# Patient Record
Sex: Female | Born: 1975 | Race: White | Hispanic: No | Marital: Married | Smoking: Never smoker
Health system: Southern US, Community
[De-identification: ages and names within clinical notes are randomized; demographics above are authoritative.]

## PROBLEM LIST (undated history)

## (undated) DIAGNOSIS — D649 Anemia, unspecified: Secondary | ICD-10-CM

## (undated) DIAGNOSIS — F329 Major depressive disorder, single episode, unspecified: Secondary | ICD-10-CM

## (undated) DIAGNOSIS — R519 Headache, unspecified: Secondary | ICD-10-CM

## (undated) DIAGNOSIS — J209 Acute bronchitis, unspecified: Secondary | ICD-10-CM

## (undated) DIAGNOSIS — Z0000 Encounter for general adult medical examination without abnormal findings: Secondary | ICD-10-CM

## (undated) DIAGNOSIS — G8929 Other chronic pain: Secondary | ICD-10-CM

## (undated) DIAGNOSIS — R1032 Left lower quadrant pain: Secondary | ICD-10-CM

## (undated) DIAGNOSIS — H409 Unspecified glaucoma: Secondary | ICD-10-CM

## (undated) DIAGNOSIS — F32A Depression, unspecified: Secondary | ICD-10-CM

## (undated) DIAGNOSIS — Z8742 Personal history of other diseases of the female genital tract: Secondary | ICD-10-CM

## (undated) DIAGNOSIS — R51 Headache: Secondary | ICD-10-CM

## (undated) HISTORY — PX: ANKLE SURGERY: SHX546

## (undated) HISTORY — DX: Encounter for general adult medical examination without abnormal findings: Z00.00

## (undated) HISTORY — DX: Headache: R51

## (undated) HISTORY — DX: Anemia, unspecified: D64.9

## (undated) HISTORY — DX: Acute bronchitis, unspecified: J20.9

## (undated) HISTORY — DX: Headache, unspecified: R51.9

## (undated) HISTORY — DX: Personal history of other diseases of the female genital tract: Z87.42

## (undated) HISTORY — DX: Left lower quadrant pain: R10.32

## (undated) HISTORY — DX: Unspecified glaucoma: H40.9

## (undated) HISTORY — DX: Major depressive disorder, single episode, unspecified: F32.9

## (undated) HISTORY — DX: Depression, unspecified: F32.A

## (undated) HISTORY — DX: Other chronic pain: G89.29

---

## 2002-02-15 ENCOUNTER — Emergency Department (HOSPITAL_COMMUNITY): Admission: EM | Admit: 2002-02-15 | Discharge: 2002-02-15 | Payer: Self-pay

## 2004-06-25 ENCOUNTER — Ambulatory Visit: Payer: Self-pay | Admitting: Internal Medicine

## 2004-07-24 ENCOUNTER — Ambulatory Visit: Payer: Self-pay | Admitting: Internal Medicine

## 2004-11-06 ENCOUNTER — Ambulatory Visit: Payer: Self-pay | Admitting: Internal Medicine

## 2005-09-17 ENCOUNTER — Emergency Department (HOSPITAL_COMMUNITY): Admission: EM | Admit: 2005-09-17 | Discharge: 2005-09-17 | Payer: Self-pay | Admitting: Emergency Medicine

## 2005-09-18 ENCOUNTER — Ambulatory Visit: Payer: Self-pay | Admitting: Internal Medicine

## 2005-09-23 ENCOUNTER — Ambulatory Visit: Payer: Self-pay | Admitting: Internal Medicine

## 2005-09-25 ENCOUNTER — Ambulatory Visit: Payer: Self-pay

## 2005-09-25 ENCOUNTER — Encounter: Payer: Self-pay | Admitting: Cardiology

## 2006-01-15 ENCOUNTER — Ambulatory Visit: Payer: Self-pay | Admitting: Internal Medicine

## 2006-05-08 ENCOUNTER — Ambulatory Visit: Payer: Self-pay | Admitting: Internal Medicine

## 2006-07-16 ENCOUNTER — Ambulatory Visit: Payer: Self-pay | Admitting: Internal Medicine

## 2006-09-29 DIAGNOSIS — J309 Allergic rhinitis, unspecified: Secondary | ICD-10-CM

## 2006-09-29 DIAGNOSIS — J45909 Unspecified asthma, uncomplicated: Secondary | ICD-10-CM | POA: Insufficient documentation

## 2006-09-29 DIAGNOSIS — R51 Headache: Secondary | ICD-10-CM

## 2006-09-29 DIAGNOSIS — R519 Headache, unspecified: Secondary | ICD-10-CM | POA: Insufficient documentation

## 2007-03-01 ENCOUNTER — Ambulatory Visit: Payer: Self-pay | Admitting: Internal Medicine

## 2007-03-01 DIAGNOSIS — R5381 Other malaise: Secondary | ICD-10-CM

## 2007-03-01 DIAGNOSIS — R5383 Other fatigue: Secondary | ICD-10-CM

## 2007-03-05 LAB — CONVERTED CEMR LAB
Basophils Absolute: 0 10*3/uL (ref 0.0–0.1)
Basophils Relative: 0.4 % (ref 0.0–1.0)
Eosinophils Absolute: 0.2 10*3/uL (ref 0.0–0.6)
Eosinophils Relative: 3.1 % (ref 0.0–5.0)
HCT: 40.4 % (ref 36.0–46.0)
Hemoglobin: 14.2 g/dL (ref 12.0–15.0)
Lymphocytes Relative: 28.1 % (ref 12.0–46.0)
MCHC: 35.2 g/dL (ref 30.0–36.0)
MCV: 93.3 fL (ref 78.0–100.0)
Monocytes Absolute: 0.4 10*3/uL (ref 0.2–0.7)
Monocytes Relative: 7 % (ref 3.0–11.0)
Neutro Abs: 4 10*3/uL (ref 1.4–7.7)
Neutrophils Relative %: 61.4 % (ref 43.0–77.0)
Platelets: 267 10*3/uL (ref 150–400)
RBC: 4.33 M/uL (ref 3.87–5.11)
RDW: 11.3 % — ABNORMAL LOW (ref 11.5–14.6)
WBC: 6.4 10*3/uL (ref 4.5–10.5)

## 2007-03-10 ENCOUNTER — Encounter: Payer: Self-pay | Admitting: Internal Medicine

## 2007-05-10 ENCOUNTER — Ambulatory Visit: Payer: Self-pay | Admitting: Internal Medicine

## 2007-08-30 ENCOUNTER — Ambulatory Visit: Payer: Self-pay | Admitting: Internal Medicine

## 2007-08-30 DIAGNOSIS — H6690 Otitis media, unspecified, unspecified ear: Secondary | ICD-10-CM | POA: Insufficient documentation

## 2008-04-25 ENCOUNTER — Ambulatory Visit: Payer: Self-pay | Admitting: Internal Medicine

## 2008-04-25 DIAGNOSIS — N6089 Other benign mammary dysplasias of unspecified breast: Secondary | ICD-10-CM

## 2008-08-31 ENCOUNTER — Ambulatory Visit: Payer: Self-pay | Admitting: Family Medicine

## 2008-08-31 LAB — CONVERTED CEMR LAB
Bilirubin Urine: NEGATIVE
Blood in Urine, dipstick: NEGATIVE
Glucose, Urine, Semiquant: NEGATIVE
Ketones, urine, test strip: NEGATIVE
Nitrite: NEGATIVE
Protein, U semiquant: NEGATIVE
Specific Gravity, Urine: 1.015
Urobilinogen, UA: 0.2
WBC Urine, dipstick: NEGATIVE
pH: 7

## 2008-10-06 ENCOUNTER — Ambulatory Visit: Payer: Self-pay | Admitting: Internal Medicine

## 2008-10-06 DIAGNOSIS — R002 Palpitations: Secondary | ICD-10-CM | POA: Insufficient documentation

## 2008-10-06 DIAGNOSIS — R42 Dizziness and giddiness: Secondary | ICD-10-CM | POA: Insufficient documentation

## 2009-01-30 ENCOUNTER — Telehealth: Payer: Self-pay | Admitting: Internal Medicine

## 2009-01-30 ENCOUNTER — Ambulatory Visit: Payer: Self-pay | Admitting: Internal Medicine

## 2009-01-30 DIAGNOSIS — R1013 Epigastric pain: Secondary | ICD-10-CM

## 2009-01-30 LAB — CONVERTED CEMR LAB
ALT: 11 units/L (ref 0–35)
AST: 14 units/L (ref 0–37)
Albumin: 4.9 g/dL (ref 3.5–5.2)
Alkaline Phosphatase: 50 units/L (ref 39–117)
BUN: 16 mg/dL (ref 6–23)
Basophils Absolute: 0 10*3/uL (ref 0.0–0.1)
Basophils Relative: 0 % (ref 0–1)
Bilirubin, Direct: 0.3 mg/dL (ref 0.0–0.3)
CO2: 26 meq/L (ref 19–32)
Calcium: 9.8 mg/dL (ref 8.4–10.5)
Chloride: 101 meq/L (ref 96–112)
Creatinine, Ser: 0.68 mg/dL (ref 0.40–1.20)
Eosinophils Absolute: 0 10*3/uL (ref 0.0–0.7)
Eosinophils Relative: 0 % (ref 0–5)
Glucose, Bld: 116 mg/dL — ABNORMAL HIGH (ref 70–99)
HCT: 41.5 % (ref 36.0–46.0)
Hemoglobin: 14.3 g/dL (ref 12.0–15.0)
Indirect Bilirubin: 0.9 mg/dL (ref 0.0–0.9)
Lipase: 20 units/L (ref 0–75)
Lymphocytes Relative: 7 % — ABNORMAL LOW (ref 12–46)
Lymphs Abs: 0.6 10*3/uL — ABNORMAL LOW (ref 0.7–4.0)
MCHC: 34.5 g/dL (ref 30.0–36.0)
MCV: 91 fL (ref 78.0–100.0)
Monocytes Absolute: 0.2 10*3/uL (ref 0.1–1.0)
Monocytes Relative: 2 % — ABNORMAL LOW (ref 3–12)
Neutro Abs: 8.1 10*3/uL — ABNORMAL HIGH (ref 1.7–7.7)
Neutrophils Relative %: 91 % — ABNORMAL HIGH (ref 43–77)
Platelets: 227 10*3/uL (ref 150–400)
Potassium: 4.5 meq/L (ref 3.5–5.3)
RBC: 4.56 M/uL (ref 3.87–5.11)
RDW: 11.8 % (ref 11.5–15.5)
Sodium: 137 meq/L (ref 135–145)
Total Bilirubin: 1.2 mg/dL (ref 0.3–1.2)
Total Protein: 7.7 g/dL (ref 6.0–8.3)
WBC: 8.9 10*3/uL (ref 4.0–10.5)

## 2009-08-27 ENCOUNTER — Ambulatory Visit: Payer: Self-pay | Admitting: Internal Medicine

## 2009-08-27 LAB — CONVERTED CEMR LAB: IgE (Immunoglobulin E), Serum: 207.4 intl units/mL — ABNORMAL HIGH (ref 0.0–180.0)

## 2009-08-28 ENCOUNTER — Encounter: Payer: Self-pay | Admitting: Internal Medicine

## 2009-11-09 ENCOUNTER — Ambulatory Visit: Payer: Self-pay | Admitting: Diagnostic Radiology

## 2009-11-09 ENCOUNTER — Ambulatory Visit: Payer: Self-pay | Admitting: Internal Medicine

## 2009-11-09 ENCOUNTER — Ambulatory Visit (HOSPITAL_BASED_OUTPATIENT_CLINIC_OR_DEPARTMENT_OTHER): Admission: RE | Admit: 2009-11-09 | Discharge: 2009-11-09 | Payer: Self-pay | Admitting: Internal Medicine

## 2009-11-09 DIAGNOSIS — M542 Cervicalgia: Secondary | ICD-10-CM

## 2009-11-09 DIAGNOSIS — R011 Cardiac murmur, unspecified: Secondary | ICD-10-CM

## 2009-11-12 ENCOUNTER — Telehealth: Payer: Self-pay | Admitting: Internal Medicine

## 2009-12-18 ENCOUNTER — Ambulatory Visit: Payer: Self-pay | Admitting: Internal Medicine

## 2009-12-18 DIAGNOSIS — J029 Acute pharyngitis, unspecified: Secondary | ICD-10-CM

## 2009-12-22 ENCOUNTER — Emergency Department (HOSPITAL_BASED_OUTPATIENT_CLINIC_OR_DEPARTMENT_OTHER): Admission: EM | Admit: 2009-12-22 | Discharge: 2009-12-22 | Payer: Self-pay | Admitting: Emergency Medicine

## 2009-12-24 ENCOUNTER — Telehealth: Payer: Self-pay | Admitting: Internal Medicine

## 2010-03-31 LAB — CONVERTED CEMR LAB
ALT: 10 units/L (ref 0–35)
AST: 15 units/L (ref 0–37)
Albumin: 4.5 g/dL (ref 3.5–5.2)
Alkaline Phosphatase: 53 units/L (ref 39–117)
BUN: 8 mg/dL (ref 6–23)
Basophils Absolute: 0 10*3/uL (ref 0.0–0.1)
Basophils Relative: 1 % (ref 0–1)
Bilirubin Urine: NEGATIVE
Bilirubin, Direct: 0.3 mg/dL (ref 0.0–0.3)
CO2: 28 meq/L (ref 19–32)
Calcium: 9.4 mg/dL (ref 8.4–10.5)
Chloride: 103 meq/L (ref 96–112)
Creatinine, Ser: 0.58 mg/dL (ref 0.40–1.20)
Eosinophils Absolute: 0.1 10*3/uL (ref 0.0–0.7)
Eosinophils Relative: 1 % (ref 0–5)
Free T4: 1.07 ng/dL (ref 0.80–1.80)
Glucose, Bld: 87 mg/dL (ref 70–99)
HCT: 40.3 % (ref 36.0–46.0)
Hemoglobin, Urine: NEGATIVE
Hemoglobin: 14.2 g/dL (ref 12.0–15.0)
Indirect Bilirubin: 1.2 mg/dL — ABNORMAL HIGH (ref 0.0–0.9)
Ketones, ur: NEGATIVE mg/dL
Leukocytes, UA: NEGATIVE
Lymphocytes Relative: 25 % (ref 12–46)
Lymphs Abs: 1 10*3/uL (ref 0.7–4.0)
MCHC: 35.2 g/dL (ref 30.0–36.0)
MCV: 90.2 fL (ref 78.0–100.0)
Monocytes Absolute: 0.4 10*3/uL (ref 0.1–1.0)
Monocytes Relative: 9 % (ref 3–12)
Neutro Abs: 2.7 10*3/uL (ref 1.7–7.7)
Neutrophils Relative %: 64 % (ref 43–77)
Nitrite: NEGATIVE
Platelets: 225 10*3/uL (ref 150–400)
Potassium: 5.2 meq/L (ref 3.5–5.3)
Preg, Serum: NEGATIVE
Protein, ur: NEGATIVE mg/dL
RBC: 4.47 M/uL (ref 3.87–5.11)
RDW: 12.1 % (ref 11.5–15.5)
Sodium: 139 meq/L (ref 135–145)
Specific Gravity, Urine: 1.009 (ref 1.005–1.030)
TSH: 1.808 microintl units/mL (ref 0.350–4.500)
Total Bilirubin: 1.5 mg/dL — ABNORMAL HIGH (ref 0.3–1.2)
Total Protein: 7.4 g/dL (ref 6.0–8.3)
Urine Glucose: NEGATIVE mg/dL
Urobilinogen, UA: 0.2 (ref 0.0–1.0)
WBC: 4.2 10*3/uL (ref 4.0–10.5)
pH: 8 (ref 5.0–8.0)

## 2010-04-02 NOTE — Letter (Signed)
   [place] at [facility] [address] [place], [place]  [address]  [place] Phone: [phone]      August 28, 2009   [place] [facility] FARRINGTON [address] [facility] [place], [place] [address]  RE:  LAB RESULTS  Dear  Ms. FARRINGTON,  The following is an interpretation of your most recent lab tests.  Please take note of any instructions provided or changes to medications that have resulted from your lab work.   Allergy panel - shows sensitivity to dust mites, cat dander and grass pollen.    I suggest you use dust mite mattress cover, pillow case, and dehumidifier in your bedroom.  See attached sheet on allergen avoidance.       Sincerely Yours,    Dr. Thomos Lemons

## 2010-04-02 NOTE — Assessment & Plan Note (Signed)
Summary: Sore Throat/hea   Vital Signs:  Patient profile:   35 year old female Height:      63 inches Weight:      133.25 pounds BMI:     23.69 O2 Sat:      100 % on Room air Temp:     98.1 degrees F rectal Pulse rate:   90 / minute Pulse rhythm:   regular Resp:     18 per minute BP sitting:   104 / 60  (right arm) Cuff size:   regular  Vitals Entered By: Glendell Docker CMA (December 18, 2009 11:25 AM)  O2 Flow:  Room air CC: Sore Throat Is Patient Diabetic? No Pain Assessment Patient in pain? no      Comments sudden onset of sore throat yesterday with fever of 101.2 relieved by Tylenol, c/o pain with swallowing and radiating into ears   Primary Care Provider:  Dondra Spry DO  CC:  Sore Throat.  History of Present Illness: 35 y/o white female c/o severe sore throat no neck tenderness several  sick contacts at work  Preventive Screening-Counseling & Management  Alcohol-Tobacco     Smoking Status: never  Allergies (verified): No Known Drug Allergies  Past History:  Past Medical History: Allergic rhinitis Asthma Chronic Headaches (Migraines)       Family History: Mother - history of hyperthyoidism Father - healthy Breast cancer - PGM       Physical Exam  General:  alert, well-developed, and well-nourished.   Mouth:  pharyngeal erythema. slight exudate on left side Lungs:  normal respiratory effort and normal breath sounds.   Heart:  normal rate and regular rhythm.  faint blowing SEM left sternal border   Impression & Recommendations:  Problem # 1:  ACUTE PHARYNGITIS (ICD-462)  Her updated medication list for this problem includes:    Cefuroxime Axetil 500 Mg Tabs (Cefuroxime axetil) ..... One by mouth two times a day  Instructed to complete antibiotics and call if not improved in 48 hours.   Complete Medication List: 1)  Cyclobenzaprine Hcl 5 Mg Tabs (Cyclobenzaprine hcl) .... One by mouth three times a day as needed 2)  Cefuroxime Axetil  500 Mg Tabs (Cefuroxime axetil) .... One by mouth two times a day  Patient Instructions: 1)  Call our office if your symptoms do not  improve or gets worse. Prescriptions: CEFUROXIME AXETIL 500 MG TABS (CEFUROXIME AXETIL) one by mouth two times a day  #20 x 0   Entered and Authorized by:   D. Thomos Lemons DO   Signed by:   D. Thomos Lemons DO on 12/18/2009   Method used:   Electronically to        [facility] (retail)       [address]       [place], [place]  [address]       Ph: [phone]       Fax: [phone]   RxID:   [id number]    Orders Added: 1)  Est. Patient Level III [40[address]]   Immunization History:  Influenza Immunization History:    Influenza:  historical (12/04/2009)   Immunization History:  Influenza Immunization History:    Influenza:  Historical (12/04/2009)  Prevention & Chronic Care Immunizations   Influenza vaccine: Historical  (12/04/2009)    Tetanus booster: Not documented    Pneumococcal vaccine: Not documented  Other Screening   Pap smear: Not documented  Smoking status: never  (12/18/2009)   Current Allergies (reviewed today): No known allergies

## 2010-04-02 NOTE — Progress Notes (Signed)
Summary: Status Update  Phone Note Call from Patient Call back at Home Phone [phone] Call back at [phone] ext [phone]   Caller: Patient Call For: D. Thomos Lemons DO Summary of Call: patient called and left voice message stating she had a viral bug Saturday and was not able to keep anything down due to vomiting. Her message states that she has missed 2 doses of the antibiotics and she wasnted to know if it was okay to resume dosage and finish medication Initial call taken by: Glendell Docker CMA,  December 24, 2009 11:26 AM  Follow-up for Phone Call        yes Follow-up by: D. Thomos Lemons DO,  December 24, 2009 12:15 PM  Additional Follow-up for Phone Call Additional follow up Details #1::        call returned to patient at [phone], no answer. A detailed voice message was left informing patient ok to resume and complete antibiotics per Dr Artist Pais instruction. Additional Follow-up by: Glendell Docker CMA,  December 24, 2009 12:19 PM

## 2010-04-02 NOTE — Assessment & Plan Note (Signed)
Summary: headache/mhf   Vital Signs:  Patient profile:   35 year old female Height:      63 inches Weight:      132.50 pounds BMI:     23.56 O2 Sat:      100 % on Room air Temp:     98.0 degrees F oral Pulse rate:   77 / minute Pulse rhythm:   regular Resp:     16 per minute BP sitting:   112 / 70  (right arm) Cuff size:   regular  Vitals Entered By: Glendell Docker CMA (November 09, 2009 8:43 AM)  O2 Flow:  Room air CC: head and neck pain Is Patient Diabetic? No Pain Assessment Patient in pain? 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 Thomos Lemons DO  CC:  head and neck pain.  History of Present Illness: 35 y/o white female c/o headache and neck pain fell down steps 2 weeks ago at friends house - 10 steps onto landing she leaned backwards and broke her fall  2 days after she noticed  neck pain - dull ache neck pain worse with flexion headache does feel like migraine   Preventive Screening-Counseling & Management  Alcohol-Tobacco     Smoking Status: never  Allergies (verified): No Known Drug Allergies  Past History:  Past Medical History: Allergic rhinitis Asthma Chronic Headaches (Migraines)      Past Surgical History: No surgical hx noted 7/08     Family History: Mother - history of hyperthyoidism Father - healthy Breast cancer - PGM     Physical Exam  General:  alert, well-developed, and well-nourished.   Eyes:  vision grossly intact, pupils equal, pupils round, and pupils reactive to light.   Ears:  R ear normal and L ear normal.   Neck:  supple.  decreased ROM - flexion, leftward rotation and sidebending Lungs:  normal respiratory effort and normal breath  sounds.   Heart:  normal rate and regular rhythm.  faint blowing SEM left sternal border   Impression & Recommendations:  Problem # 1:  NECK PAIN (ICD-723.1) neck x ray is normal.  probable neck strain.  use muscle relaxers and otc ibuprofen 600 mg two times a day x 1 week. she defers PT referral for now Patient advised to call office if symptoms persist or worsen.  Orders: T-Cervicle Spine 2-3 Views [phone])  Her updated medication list for this problem includes:    Metaxalone 800 Mg Tabs (Metaxalone) ..... One by mouth three times a day as needed for neck pain  Problem # 2:  CARDIAC MURMUR (ICD-785.2) Pt probably has flow murmur prev 2 D Echo in 09/25/2005 showed:        LEFT VENTRICLE:   -  Left ventricular size was normal.   -  Overall left ventricular systolic function was normal.   -  Left ventricular ejection fraction was estimated , range being 55         % to 65 %.   -  There were no left ventricular regional wall motion         abnormalities.   -  Left ventricular wall thickness  was normal.   AORTIC VALVE:   -  The aortic valve was trileaflet.   -  Aortic valve thickness was normal.   -  There was normal aortic valve leaflet excursion.    Doppler interpretation(s):   -  Transaortic velocity was within the normal range.   -  There was no evidence for aortic valve stenosis.   -  There was no significant aortic valvular regurgitation.   AORTA:   -  The aortic root was normal in size.   MITRAL VALVE:   -  There was mild thickening of the mitral valve.    Doppler interpretation(s):   -  There was trivial mitral valvular regurgitation.   LEFT ATRIUM:   -  Left atrial size was normal.   RIGHT VENTRICLE:   -  Right ventricular size was normal.   -  Right ventricular systolic function was normal.   TRICUSPID VALVE:   -  The tricuspid valve structure was normal.   -  Tricuspid leaflet excursion was normal.    Doppler interpretation(s):   -  The transtricuspid  velocity was within the normal range.   -  There was no evidence for tricuspid stenosis.   -  There was no significant tricuspid valvular regurgitation.   RIGHT ATRIUM:   -  Right atrial size was normal.   PERICARDIUM:   -  There was no pericardial effusion.  Complete Medication List: 1)  Metaxalone 800 Mg Tabs (Metaxalone) .... One by mouth three times a day as needed for neck pain  Patient Instructions: 1)  Call our office if your symptoms do not  improve or gets worse. Prescriptions: METAXALONE 800 MG TABS (METAXALONE) one by mouth three times a day as needed for neck pain  #30 x 0   Entered and Authorized by:   D. Thomos Lemons DO   Signed by:   D. Thomos Lemons DO on 11/09/2009   Method used:   Electronically to        [facility] (retail)       [address]       [place], [place]  [address]       Ph: [phone]       Fax: [phone]   RxID:   [id number]   Current Allergies (reviewed today): No known allergies

## 2010-04-02 NOTE — Assessment & Plan Note (Signed)
Summary: sinus Valerie Ruiz   Vital Signs:  Patient profile:   35 year old female Height:      63 inches Weight:      129 pounds BMI:     22.93 O2 Sat:      100 % on Room air Temp:     97.7 degrees F oral Pulse rate:   74 / minute Pulse rhythm:   regular Resp:     16 per minute BP sitting:   100 / 70  (left arm) Cuff size:   regular  Vitals Entered By: Glendell Docker CMA (August 27, 2009 4:33 PM)  O2 Flow:  Room air CC: Rm 2- Sinus Pain Is Patient Diabetic? No Comments c/o sinus pressure, nausea, nasal drainage into throat, no cough, headaches,facial pain , off and on for the past 3 weeks. self cares meausres with no relief, no rx  medications, medications reviewed   Primary Care Provider:  D. Thomos Lemons DO  CC:  Rm 2- Sinus Pain.  History of Present Illness: 35 y/o white female c/o pressure sensation bridge of nose constant mucus back of the throat no fever,  no yellow drainage    Allergies (verified): No Known Drug Allergies  Past History:  Past Medical History: Allergic rhinitis Asthma Chronic Headaches (Migraines)     Past Surgical History: No surgical hx noted 7/08    Family History: Mother - history of hyperthyoidism Father - healthy Breast cancer - PGM    Social History: Occupation: Full time at [employer]  Alcohol use-yes (3 drinks per week)  Never Smoked  Physical Exam  General:  alert, well-developed, and well-nourished.   Nose:  mucosal erythema and mucosal edema.   Lungs:  normal respiratory effort and normal breath sounds.   Heart:  normal rate, regular rhythm, and no gallop.     Impression & Recommendations:  Problem # 1:  ALLERGIC RHINITIS (ICD-477.9) add astelin NS.  If no improvement,  consider short course of prednisone  The following medications were removed from the medication list:    Promethazine Hcl 12.5 Mg Tabs (Promethazine hcl) ..... One by mouth two times a day prn    Promethazine Hcl 12.5 Mg Supp  (Promethazine hcl) ..... Use 12.5 mg pr two times a day as needed nausea Her updated medication list for this problem includes:    Fluticasone Propionate 50 Mcg/act Susp (Fluticasone propionate) .[facility]... 2 sprays each nostril once daily    Azelastine Hcl 137 Mcg/spray Soln (Azelastine hcl) .Marland Kitchen... 2 sprays two times a day    Fexofenadine Hcl 180 Mg Tabs (Fexofenadine hcl) ..... One by mouth once daily  Orders: T- * Misc. Laboratory test [phone])  Complete Medication List: 1)  Fluticasone Propionate 50 Mcg/act Susp (Fluticasone propionate) .... 2 sprays each nostril once daily 2)  Azelastine Hcl 137 Mcg/spray Soln (Azelastine hcl) .... 2 sprays two times a day 3)  Fexofenadine Hcl 180 Mg Tabs (Fexofenadine hcl) .... One by mouth once daily  Patient Instructions: 1)  Call our office if your symptoms do not  improve or gets worse. Prescriptions: FEXOFENADINE HCL 180 MG TABS (FEXOFENADINE HCL) one by mouth once daily  #30 x 3   Entered and Authorized by:   D. Thomos Lemons DO   Signed by:   D. Thomos Lemons DO on 08/27/2009   Method used:   Electronically to        [facility] (retail)       [address]  [address]       [place], [place]  [address]       Ph: [phone]       Fax: [phone]   RxID:   [id number] AZELASTINE HCL 137 MCG/SPRAY SOLN (AZELASTINE HCL) 2 sprays two times a day  #1 x 3   Entered and Authorized by:   D. Thomos Lemons DO   Signed by:   D. Thomos Lemons DO on 08/27/2009   Method used:   Electronically to        [facility] (retail)       [address]       [place], [place]  [address]       Ph: [phone]       Fax: [phone]   RxID:   [id number] FLUTICASONE PROPIONATE 50 MCG/ACT SUSP (FLUTICASONE PROPIONATE) 2 sprays each nostril once daily  #1 x 2   Entered and Authorized by:   D. Thomos Lemons DO   Signed by:   D. Thomos Lemons DO on 08/27/2009   Method used:    Electronically to        [facility] Kitchen (retail)       [address]       [place], [place]  [address]       Ph: [phone]       Fax: [phone]   RxID:   [id number]   Current Allergies (reviewed today): No known allergies

## 2010-04-02 NOTE — Progress Notes (Signed)
Summary: med unavailable   Phone Note Call from Patient   Caller: patient  Call For: Trenden Hazelrigg  Summary of Call: skelaxin was sent to Karin Golden in [facility] they said this med is unavailable  please send an alternative and let patient know [phone] Initial call taken by: Roselle Locus,  November 12, 2009 11:27 AM  Follow-up for Phone Call        call placed to patient at [phone], no answer. A voice message was left informing patient rx sent to pharmacy Follow-up by: Glendell Docker CMA,  November 12, 2009 1:51 PM    New/Updated Medications: CYCLOBENZAPRINE HCL 5 MG TABS (CYCLOBENZAPRINE HCL) one by mouth three times a day as needed Prescriptions: CYCLOBENZAPRINE HCL 5 MG TABS (CYCLOBENZAPRINE HCL) one by mouth three times a day as needed  #30 x 0   Entered and Authorized by:   D. Thomos Lemons DO   Signed by:   D. Thomos Lemons DO on 11/12/2009   Method used:   Electronically to        [facility] (retail)       [address]       [place], [place]  [address]       Ph: [phone]       Fax: [phone]   RxID:   [id number]

## 2010-04-25 ENCOUNTER — Encounter: Payer: Self-pay | Admitting: Internal Medicine

## 2010-04-25 ENCOUNTER — Ambulatory Visit (INDEPENDENT_AMBULATORY_CARE_PROVIDER_SITE_OTHER): Payer: BC Managed Care – PPO | Admitting: Internal Medicine

## 2010-04-25 DIAGNOSIS — R51 Headache: Secondary | ICD-10-CM

## 2010-04-25 DIAGNOSIS — R5383 Other fatigue: Secondary | ICD-10-CM

## 2010-04-25 LAB — CONVERTED CEMR LAB
Anti Nuclear Antibody(ANA): NEGATIVE
BUN: 11 mg/dL (ref 6–23)
Basophils Absolute: 0 10*3/uL (ref 0.0–0.1)
Basophils Relative: 1 % (ref 0–1)
CO2: 26 meq/L (ref 19–32)
Calcium: 9.5 mg/dL (ref 8.4–10.5)
Chloride: 101 meq/L (ref 96–112)
Creatinine, Ser: 0.66 mg/dL (ref 0.40–1.20)
Eosinophils Absolute: 0.1 10*3/uL (ref 0.0–0.7)
Eosinophils Relative: 3 % (ref 0–5)
Free T4: 1.13 ng/dL (ref 0.80–1.80)
Glucose, Bld: 63 mg/dL — ABNORMAL LOW (ref 70–99)
HCT: 39.1 % (ref 36.0–46.0)
Hemoglobin: 13.3 g/dL (ref 12.0–15.0)
Lymphocytes Relative: 27 % (ref 12–46)
Lymphs Abs: 1.2 10*3/uL (ref 0.7–4.0)
MCHC: 34 g/dL (ref 30.0–36.0)
MCV: 93.8 fL (ref 78.0–100.0)
Monocytes Absolute: 0.5 10*3/uL (ref 0.1–1.0)
Monocytes Relative: 12 % (ref 3–12)
Neutro Abs: 2.5 10*3/uL (ref 1.7–7.7)
Neutrophils Relative %: 58 % (ref 43–77)
Platelets: 198 10*3/uL (ref 150–400)
Potassium: 4.4 meq/L (ref 3.5–5.3)
RBC: 4.17 M/uL (ref 3.87–5.11)
RDW: 12.4 % (ref 11.5–15.5)
Sodium: 139 meq/L (ref 135–145)
TSH: 1.63 microintl units/mL (ref 0.350–4.500)
WBC: 4.3 10*3/uL (ref 4.0–10.5)

## 2010-04-29 ENCOUNTER — Encounter: Payer: Self-pay | Admitting: Internal Medicine

## 2010-05-08 ENCOUNTER — Encounter: Payer: Self-pay | Admitting: Internal Medicine

## 2010-05-08 ENCOUNTER — Ambulatory Visit (INDEPENDENT_AMBULATORY_CARE_PROVIDER_SITE_OTHER): Payer: BC Managed Care – PPO | Admitting: Internal Medicine

## 2010-05-08 ENCOUNTER — Telehealth: Payer: Self-pay | Admitting: Internal Medicine

## 2010-05-08 DIAGNOSIS — R002 Palpitations: Secondary | ICD-10-CM

## 2010-05-08 DIAGNOSIS — R011 Cardiac murmur, unspecified: Secondary | ICD-10-CM

## 2010-05-09 ENCOUNTER — Ambulatory Visit (HOSPITAL_COMMUNITY): Payer: BC Managed Care – PPO | Attending: Internal Medicine

## 2010-05-09 ENCOUNTER — Telehealth: Payer: Self-pay | Admitting: Internal Medicine

## 2010-05-09 DIAGNOSIS — R002 Palpitations: Secondary | ICD-10-CM | POA: Insufficient documentation

## 2010-05-09 DIAGNOSIS — R5381 Other malaise: Secondary | ICD-10-CM | POA: Insufficient documentation

## 2010-05-09 DIAGNOSIS — R011 Cardiac murmur, unspecified: Secondary | ICD-10-CM | POA: Insufficient documentation

## 2010-05-09 NOTE — Letter (Signed)
   [place] at [facility] [address] [place], [place]  [address]  [place] Phone: [phone]      April 29, 2010   [facility] Valerie Ruiz [address] [facility] [place], [place] [address]  RE:  LAB RESULTS  Dear  Ms. Valerie Ruiz,  The following is an interpretation of your most recent lab tests.  Please take note of any instructions provided or changes to medications that have resulted from your lab work.  ELECTROLYTES:  Good - no changes needed  KIDNEY FUNCTION TESTS:  Stable - no changes needed  THYROID STUDIES:  Thyroid studies normal TSH: 1.630     CBC:  Good - no changes needed   ANA screen - negative       Sincerely Yours,    Dr. Thomos Lemons  Appended Document:  mailed

## 2010-05-10 ENCOUNTER — Telehealth: Payer: Self-pay | Admitting: Internal Medicine

## 2010-05-14 ENCOUNTER — Ambulatory Visit: Payer: BC Managed Care – PPO | Admitting: Internal Medicine

## 2010-05-14 NOTE — Assessment & Plan Note (Signed)
Summary: fatigue/dizziness/headaches/ss   Vital Signs:  Patient profile:   35 year old female Height:      63 inches Weight:      136 pounds BMI:     24.18 O2 Sat:      100 % on Room air Temp:     98.3 degrees F oral Pulse rate:   93 / minute Resp:     18 per minute BP sitting:   120 / 70  (right arm) Cuff size:   regular  Vitals Entered By: Glendell Docker CMA (April 25, 2010 8:55 AM)  O2 Flow:  Room air CC: Fatigue Is Patient Diabetic? No Pain Assessment Patient in pain? no      Comments past 2 weeks c/o increase in fatigue, daily headaches, random dizziness     Last PAP Result appt 3/12   Primary Care Provider:  Dondra Spry DO  CC:  Fatigue.  History of Present Illness: 35 y/o female c/o  unusual fatigue x 2 weeks  also headache.  symptoms seem  to start at base of neck no unusual severity  can be 8 out of 10 usually dull some nasal congestion  periods can be heavy but last only 2-3 day no melena   Preventive Screening-Counseling & Management  Alcohol-Tobacco     Smoking Status: never  Allergies (verified): No Known Drug Allergies  Past History:  Past Medical History: Allergic rhinitis Asthma Chronic Headaches (Migraines)         Past Surgical History: No surgical hx noted 7/08      Family History: Mother - history of hyperthyoidism Father - healthy Breast cancer - PGM        Social History: Occupation: Full time at [employer] Single  Alcohol use-yes (3 drinks per week)  Never Smoked    Review of Systems       no facial rash no arthralgia ,  occ knee pain after exercise no alopecia  Physical Exam  General:  alert, well-developed, and well-nourished.   Head:  normocephalic and atraumatic.   Eyes:  pupils equal, pupils round, and pupils reactive to light.  no nystagmus,  no peripheral vision defect  Ears:  R ear normal and L ear normal.   Mouth:  pharynx pink and moist.   Neck:  supple, no masses, and no  thyromegaly.   Lungs:  normal respiratory effort and normal breath sounds.   Heart:  normal rate, regular rhythm, no murmur, and no gallop.   Abdomen:  soft, non-tender, normal bowel sounds, no masses, no hepatomegaly, and no splenomegaly.   Neurologic:  cranial nerves II-XII intact and gait normal.   Psych:  normally interactive, good eye contact, not anxious appearing, and not depressed appearing.     Impression & Recommendations:  Problem # 1:  HEADACHE (ICD-784.0) Assessment Deteriorated HA may be exacerbated by probable viral infection use muscle relaxer as directed  Problem # 2:  FATIGUE (ICD-780.79)  Orders: T-Basic Metabolic Panel [phone]) T-CBC w/Diff [phone]) T-TSH [phone]) T-T4, Free [phone]) T-Antinuclear Antib (ANA) [phone])  Complete Medication List: 1)  Flonase 50 Mcg/act Susp (Fluticasone propionate) .... Two sprays each nostril once daily as needed 2)  Cyclobenzaprine Hcl 5 Mg Tabs (Cyclobenzaprine hcl) .... One by mouth qd  Patient Instructions: 1)  Please schedule a follow-up appointment in 1 month. Prescriptions: FLONASE 50 MCG/ACT SUSP (FLUTICASONE PROPIONATE) two sprays each nostril once daily as needed  #1 x 2   Entered and Authorized by:   D.  Thomos Lemons DO   Signed by:   D. Thomos Lemons DO on 04/25/2010   Method used:   Electronically to        [facility] (retail)       [address]       [place], [place]  [address]       Ph: [phone]       Fax: [phone]   RxID:   [id number] CYCLOBENZAPRINE HCL 5 MG TABS (CYCLOBENZAPRINE HCL) one by mouth qd  #30 x 0   Entered and Authorized by:   D. Thomos Lemons DO   Signed by:   D. Thomos Lemons DO on 04/25/2010   Method used:   Electronically to        [facility] Kitchen (retail)       [address]       [place], [place]  [address]       Ph: [phone]       Fax: [phone]    RxID:   [id number]    Orders Added: 1)  T-Basic Metabolic Panel [id number] 2)  T-CBC w/Diff [[id number]] 3)  T-TSH [[id number]] 4)  T-T4, Free [[id number]] 5)  T-Antinuclear Antib (ANA) [[id number]] 6)  Est. Patient Level III [[address]]    Current Allergies (reviewed today): No known allergies     Preventive Care Screening  Pap Smear:    Date:  04/25/2010    Results:  appt 3/12

## 2010-05-14 NOTE — Progress Notes (Signed)
Summary: 2 D echo results  Phone Note Outgoing Call   Summary of Call: call pt - 2 D echo normal.     Follow-up for Phone Call        call placed to patient at  [phone], she was informed per Dr Artist Pais instructions Follow-up by: Glendell Docker CMA,  May 10, 2010 8:54 AM

## 2010-05-14 NOTE — Progress Notes (Signed)
Summary: Chest pain  Phone Note Other Incoming   Summary of Call: 35 year old female calls with complaint of intermittent heart racing last night for 3 hours.  Off/on this AM.  Pain over her left breast last night, lasted only for a few seconds.   No current palpitations,  no chest pain at present.  Recommend OV today to see Dr. Artist Pais- go to ED if recurrent chest pain.  Pt verbalizes understanding.  Darlene, pls call pt and schedule apt. Initial call taken by: Lemont Fillers FNP,  May 08, 2010 10:01 AM  Follow-up for Phone Call        patient has scheduled with Dr Artist Pais for 11:30 am today Follow-up by: Glendell Docker CMA,  May 08, 2010 10:11 AM

## 2010-05-14 NOTE — Progress Notes (Signed)
Summary: Follow up appointment  Phone Note Call from Patient Call back at Home Phone [phone]   Caller: Patient Call For: D. Thomos Lemons DO Summary of Call: patient called and left voice message wanting to  know since she has received her test results, does she need to keep the appointment she has scheduled for next week. Initial call taken by: Glendell Docker CMA,  May 10, 2010 3:05 PM  Follow-up for Phone Call        only if she has recurrent symptoms Follow-up by: D. Thomos Lemons DO,  May 10, 2010 4:50 PM  Additional Follow-up for Phone Call Additional follow up Details #1::        call placed to patient at  [phone], no answer. A detailed voice message was left for patient informing her per Dr Artist Pais instructions.  Message was left for patient to call back an cancel appointment at her discretion. Additional Follow-up by: Glendell Docker CMA,  May 10, 2010 4:55 PM

## 2010-05-15 ENCOUNTER — Encounter: Payer: Self-pay | Admitting: Internal Medicine

## 2010-05-15 LAB — CBC
HCT: 40.2 % (ref 36.0–46.0)
MCH: 32.4 pg (ref 26.0–34.0)
MCV: 96.1 fL (ref 78.0–100.0)
Platelets: 186 10*3/uL (ref 150–400)
RDW: 11.2 % — ABNORMAL LOW (ref 11.5–15.5)

## 2010-05-15 LAB — BASIC METABOLIC PANEL
BUN: 10 mg/dL (ref 6–23)
CO2: 28 mEq/L (ref 19–32)
Calcium: 9.3 mg/dL (ref 8.4–10.5)
Chloride: 106 mEq/L (ref 96–112)
Creatinine, Ser: 0.6 mg/dL (ref 0.4–1.2)
GFR calc Af Amer: >60 mL/min (ref >60)
GFR calc non Af Amer: >60 mL/min (ref >60)
Glucose, Bld: 108 mg/dL — ABNORMAL HIGH (ref 70–99)
Potassium: 4.3 mEq/L (ref 3.5–5.1)
Sodium: 144 mEq/L (ref 135–145)

## 2010-05-15 LAB — URINALYSIS, ROUTINE W REFLEX MICROSCOPIC
Bilirubin Urine: NEGATIVE
Glucose, UA: NEGATIVE mg/dL
Hgb urine dipstick: NEGATIVE
Ketones, ur: NEGATIVE mg/dL
Protein, ur: NEGATIVE mg/dL

## 2010-05-15 LAB — DIFFERENTIAL
Basophils Absolute: 0 10*3/uL (ref 0.0–0.1)
Basophils Relative: 0 % (ref 0–1)
Monocytes Relative: 5 % (ref 3–12)
Neutro Abs: 7.4 10*3/uL (ref 1.7–7.7)
Neutrophils Relative %: 88 % — ABNORMAL HIGH (ref 43–77)

## 2010-05-28 ENCOUNTER — Ambulatory Visit: Payer: BC Managed Care – PPO | Admitting: Internal Medicine

## 2010-05-30 ENCOUNTER — Encounter: Payer: Self-pay | Admitting: Internal Medicine

## 2010-05-30 ENCOUNTER — Ambulatory Visit (INDEPENDENT_AMBULATORY_CARE_PROVIDER_SITE_OTHER): Payer: BC Managed Care – PPO | Admitting: Internal Medicine

## 2010-05-30 DIAGNOSIS — M546 Pain in thoracic spine: Secondary | ICD-10-CM

## 2010-05-30 DIAGNOSIS — R51 Headache: Secondary | ICD-10-CM

## 2010-05-30 MED ORDER — CYCLOBENZAPRINE HCL 5 MG PO TABS: 5.0000 mg | ORAL_TABLET | Freq: Two times a day (BID) | ORAL | Status: DC | PRN

## 2010-05-30 NOTE — Progress Notes (Signed)
  Subjective:    Patient ID: Valerie Ruiz, female    DOB: May 08, 1975, 35 y.o.   MRN: [id number]  HPI  Pt c/o chronic headache.  Localized to right occiptal area. 2-3 headaches per week HA is on and off for whole day No nausea, no photophobia Not like previous migraine headaches  No abnormal sensation of scalp  Pt also c/o thoracic and low back pain  Review of Systems     Objective:   Physical Exam  Constitutional: She appears well-developed and well-nourished.  Cardiovascular: Normal rate and regular rhythm.   Pulmonary/Chest: Effort normal and breath sounds normal.  Musculoskeletal:       Arms:  Somatic dysfunction - C2 right,  T7-9  left,  L4 Right       Assessment & Plan:

## 2010-05-30 NOTE — Assessment & Plan Note (Signed)
Summary: HEART RACING PT SPOKE WITH MELISSA OK TO BOOK/[facility]   Vital Signs:  Patient profile:   35 year old female Height:      63 inches Weight:      134.75 pounds BMI:     23.96 O2 Sat:      99 % on Room air Temp:     97.4 degrees F oral Pulse rate:   92 / minute Resp:     22 per minute BP sitting:   122 / 70  (right arm) Cuff size:   regular  Vitals Entered By: Glendell Docker CMA (May 08, 2010 11:30 AM)  O2 Flow:  Room air CC: Heart racing Is Patient Diabetic? No Pain Assessment Patient in pain? no      Comments sudden onset of heart racing, c/o fatigue,   Primary Care Provider:  Dondra Spry DO  CC:  Heart racing.  History of Present Illness: 35 y/o female c/o palpitations last night - felting heart racing sypmtoms lasted for 3 hrs mainly fast sensation. no irregularity she checked her pulse - 110 -115 not taking  any otc meds 1 cup of coffee yest  "feels out of shape" but no dyspnea   Preventive Screening-Counseling & Management  Alcohol-Tobacco     Smoking Status: never  Allergies (verified): No Known Drug Allergies  Past History:  Past Medical History: Allergic rhinitis Asthma Chronic Headaches (Migraines)      Heart murmur     Past Surgical History: No surgical hx noted 7/08       Family History: Mother - history of hyperthyoidism Father - healthy Breast cancer - PGM      Grandmother has MVP   Social History: Occupation: Full time at [employer] Single  Alcohol use-yes (3 drinks per week)  Never Smoked    Review of Systems       no hx of syncope  Physical Exam  General:  alert, well-developed, and well-nourished.   Neck:  supple and no masses.   Lungs:  normal respiratory effort, normal breath sounds, no crackles, and no wheezes.   Heart:  normal rate, regular rhythm, and Grade   2/6 systolic ejection murmur.     Impression & Recommendations:  Problem # 1:  PALPITATIONS, OCCASIONAL (ICD-785.1) pt c/o  heart racing on and off 3 hrs last night fleeting chest pain no SOB EKG is normal pt has murmur right sternal border that sounds louder.  low freq.  question diastolic murmur repeat 2  D Echo increase fluids for now Call our office if your symptoms do not  improve or gets worse.  Problem # 2:  CARDIAC MURMUR (ICD-785.2)  Orders: Echo Referral (Echo)  Complete Medication List: 1)  Flonase 50 Mcg/act Susp (Fluticasone propionate) .... Two sprays each nostril once daily as needed 2)  Cyclobenzaprine Hcl 5 Mg Tabs (Cyclobenzaprine hcl) .... One by mouth qd  Patient Instructions: 1)  Please schedule a follow-up appointment in 1 week   Orders Added: 1)  Echo Referral [Echo] 2)  Est. Patient Level III [41324]    Current Allergies (reviewed today): No known allergies

## 2010-06-03 DIAGNOSIS — M546 Pain in thoracic spine: Secondary | ICD-10-CM | POA: Insufficient documentation

## 2010-06-03 NOTE — Assessment & Plan Note (Signed)
Chronic occiptal headache Somatic dysfunction of  C2 may be etiology Myofascial release technique and HVLA utilized to c spine Pt tolerated well  No complications

## 2010-06-03 NOTE — Assessment & Plan Note (Signed)
Somatic dysfunction to thorcic and lumbar spine. Utilized myofascial release and HVLA treatment to T spine and L spine. Reviewed back exercises Patient advised to call office if symptoms persist or worsen.

## 2011-07-29 ENCOUNTER — Ambulatory Visit (INDEPENDENT_AMBULATORY_CARE_PROVIDER_SITE_OTHER): Payer: BC Managed Care – PPO | Admitting: Internal Medicine

## 2011-07-29 ENCOUNTER — Encounter: Payer: Self-pay | Admitting: Internal Medicine

## 2011-07-29 VITALS — BP 122/82 | HR 108 | Temp 98.6°F | Ht 63.0 in | Wt 129.0 lb

## 2011-07-29 DIAGNOSIS — R202 Paresthesia of skin: Secondary | ICD-10-CM

## 2011-07-29 DIAGNOSIS — R209 Unspecified disturbances of skin sensation: Secondary | ICD-10-CM

## 2011-07-29 LAB — BASIC METABOLIC PANEL
Chloride: 104 mEq/L (ref 96–112)
GFR: 101.55 mL/min (ref >60.00)
Potassium: 4.5 mEq/L (ref 3.5–5.1)
Sodium: 139 mEq/L (ref 135–145)

## 2011-07-29 LAB — SEDIMENTATION RATE: Sed Rate: 16 mm/hr (ref 0–22)

## 2011-07-29 LAB — CBC WITH DIFFERENTIAL/PLATELET
Basophils Absolute: 0 10*3/uL (ref 0.0–0.1)
Eosinophils Relative: 0.5 % (ref 0.0–5.0)
Monocytes Absolute: 0.4 10*3/uL (ref 0.1–1.0)
Monocytes Relative: 8.6 % (ref 3.0–12.0)
Neutrophils Relative %: 69.5 % (ref 43.0–77.0)
Platelets: 170 10*3/uL (ref 150.0–400.0)
RDW: 12.2 % (ref 11.5–14.6)
WBC: 5.1 10*3/uL (ref 4.5–10.5)

## 2011-07-29 LAB — VITAMIN B12: Vitamin B-12: 687 pg/mL (ref 211–911)

## 2011-07-29 LAB — TSH: TSH: 0.72 u[IU]/mL (ref 0.35–5.50)

## 2011-07-29 NOTE — Progress Notes (Signed)
  Subjective:    Patient ID: Valerie Ruiz, female    DOB: [birth year], 36 y.o.   MRN: [id number]  HPI  36 year old white female complains of unexplained fatigue x2-3 weeks. At the same time she has been experiencing intermittent tingling in her left hand and left foot. She also describes an episode last Thursday when she was at a bicycle spinning class, her arms felt tingly for approximately 5 minutes. Symptoms resolved with rest. She denies associated chest pain or shortness of breath. No reported neck pain. She denies any change in vision. No report of clumsiness or abnormal gait.  Review of Systems Negative for visual changes, she denies abnormal speech  Past Medical History  Diagnosis Date  . Allergic rhinitis   . Asthma   . Chronic headaches     History   Social History  . Marital Status: Single    Spouse Name: N/A    Number of Children: N/A  . Years of Education: N/A   Occupational History  . Not on file.   Social History Main Topics  . Smoking status: Never Smoker   . Smokeless tobacco: Not on file  . Alcohol Use: 1.5 oz/week    3 drink(s) per week  . Drug Use: Not on file  . Sexually Active: Not on file   Other Topics Concern  . Not on file   Social History Narrative  . No narrative on file    No past surgical history on file.  Family History  Problem Relation Age of Onset  . Hyperthyroidism Mother   . Breast cancer Paternal Grandmother     No Known Allergies  No current outpatient prescriptions on file prior to visit.    BP 122/82  Pulse 108  Temp(Src) 98.6 F (37 C) (Oral)  Ht 5\' 3"  (1.6 m)  Wt 129 lb (58.514 kg)  BMI 22.85 kg/m2       Objective:   Physical Exam  Constitutional: She is oriented to person, place, and time. She appears well-developed and well-nourished.  Cardiovascular: Normal rate, regular rhythm and normal heart sounds.   No murmur heard. Pulmonary/Chest: Effort normal. She has wheezes. She has no rales.    Abdominal: Soft. Bowel sounds are normal. She exhibits no distension and no mass.  Neurological: She is alert and oriented to person, place, and time. She displays normal reflexes. No cranial nerve deficit. She exhibits normal muscle tone. Coordination normal.       Able to discriminate sharp versus dull in her feet. No deficit in sensing vibration or temperature.  Negative for cerebellar signs. No nystagmus.        Assessment & Plan:

## 2011-07-29 NOTE — Patient Instructions (Signed)
Our office will contact you re: neurology referral We will also contact you re: blood test results.

## 2011-07-29 NOTE — Assessment & Plan Note (Signed)
36 year old white female with unexplained fatigue and tingling in in her left hand and foot. Neurologic exam is normal. However we discussed possibility of multiple sclerosis. Refer to Dr. Modesto Charon for further evaluation.

## 2011-08-28 ENCOUNTER — Encounter: Payer: Self-pay | Admitting: Internal Medicine

## 2011-08-28 ENCOUNTER — Ambulatory Visit (INDEPENDENT_AMBULATORY_CARE_PROVIDER_SITE_OTHER): Payer: 59 | Admitting: Internal Medicine

## 2011-08-28 VITALS — BP 130/78 | Temp 98.8°F | Wt 124.0 lb

## 2011-08-28 DIAGNOSIS — R1032 Left lower quadrant pain: Secondary | ICD-10-CM

## 2011-08-28 DIAGNOSIS — R209 Unspecified disturbances of skin sensation: Secondary | ICD-10-CM

## 2011-08-28 DIAGNOSIS — R1012 Left upper quadrant pain: Secondary | ICD-10-CM

## 2011-08-28 DIAGNOSIS — R202 Paresthesia of skin: Secondary | ICD-10-CM

## 2011-08-28 DIAGNOSIS — K219 Gastro-esophageal reflux disease without esophagitis: Secondary | ICD-10-CM | POA: Insufficient documentation

## 2011-08-28 LAB — POCT URINALYSIS DIPSTICK
Bilirubin, UA: NEGATIVE
Ketones, UA: NEGATIVE
Leukocytes, UA: NEGATIVE
Nitrite, UA: NEGATIVE
Protein, UA: NEGATIVE
pH, UA: 7

## 2011-08-28 MED ORDER — OMEPRAZOLE 20 MG PO CPDR: 20.0000 mg | DELAYED_RELEASE_CAPSULE | Freq: Every day | ORAL | Status: DC

## 2011-08-28 NOTE — Assessment & Plan Note (Signed)
Patient complains of intermittent reflux symptoms. She denies dysphasia or weight change. Use proton pump inhibitor for 4-6 weeks then transition to ranitidine 150 mg twice daily as needed.

## 2011-08-28 NOTE — Assessment & Plan Note (Signed)
Patient seen by neurologist Dr. Terrace Arabia. Laboratory results showed normal TSH, B12, CMP, CBC and negative ANA. MRI of brain was essentially normal. It noted incidental punctate left frontal parasagittal focus of gliosis of unclear significance.

## 2011-08-28 NOTE — Patient Instructions (Addendum)
Please call our office if you left rib pain gets worse.   You can use tylenol 650 mg every 8 hrs as needed. After taking omeprazole for 4-6 weeks, you can transition to over the counter ranitidine 150 mg twice daily as needed.

## 2011-08-28 NOTE — Assessment & Plan Note (Signed)
Patient has left upper quadrant discomfort with bending forward of unclear etiology. I doubt she has intercostal muscle strain or rib disorder. I suspect spasm of her diaphragm. Expectant management for now.

## 2011-08-28 NOTE — Progress Notes (Signed)
  Subjective:    Patient ID: Valerie Ruiz, female    DOB: May 23, 1975, 36 y.o.   MRN: [id number]  HPI  36 year old white female for followup regarding left-sided paresthesias. Patient was seen by neurologist Dr. Terrace Arabia. MRI of brain was essentially normal. Patient noted to have incidental punctate left frontal parasagittal focus of gliosis of unclear significance.  Patient reports her intermittent numbness has improved.  Patient complains of increased heartburn symptoms of the last several weeks. She has associated substernal burning sensation. She also complains of occasional left upper quadrant pain. Symptoms are triggered by bending forward. She describes sharp pain that radiates to left upper ribs. She denies history of recent URI or cough. No heavy lifting.  Review of Systems Negative for shortness of breath or cough  Past Medical History  Diagnosis Date  . Allergic rhinitis   . Asthma   . Chronic headaches     History   Social History  . Marital Status: Single    Spouse Name: N/A    Number of Children: N/A  . Years of Education: N/A   Occupational History  . Not on file.   Social History Main Topics  . Smoking status: Never Smoker   . Smokeless tobacco: Not on file  . Alcohol Use: 1.5 oz/week    3 drink(s) per week  . Drug Use: Not on file  . Sexually Active: Not on file   Other Topics Concern  . Not on file   Social History Narrative  . No narrative on file    No past surgical history on file.  Family History  Problem Relation Age of Onset  . Hyperthyroidism Mother   . Breast cancer Paternal Grandmother     No Known Allergies  Current Outpatient Prescriptions on File Prior to Visit  Medication Sig Dispense Refill  . omeprazole (PRILOSEC) 20 MG capsule Take 1 capsule (20 mg total) by mouth daily.  30 capsule  3    BP 130/78  Temp 98.8 F (37.1 C) (Oral)  Wt 124 lb (56.246 kg)       Objective:   Physical Exam  Constitutional: She is  oriented to person, place, and time. She appears well-developed and well-nourished.  Cardiovascular: Normal rate, regular rhythm and normal heart sounds.   Pulmonary/Chest: Effort normal and breath sounds normal. She has no wheezes.  Abdominal: Soft.       Mild left upper quadrant tenderness. No left rib pain  Musculoskeletal: She exhibits no edema.  Neurological: She is alert and oriented to person, place, and time.  Skin: Skin is warm and dry.  Psychiatric: She has a normal mood and affect. Her behavior is normal.          Assessment & Plan:

## 2012-11-04 ENCOUNTER — Encounter: Payer: Self-pay | Admitting: Internal Medicine

## 2012-11-04 ENCOUNTER — Ambulatory Visit (INDEPENDENT_AMBULATORY_CARE_PROVIDER_SITE_OTHER): Payer: BC Managed Care – PPO | Admitting: Internal Medicine

## 2012-11-04 VITALS — BP 110/70 | HR 88 | Temp 97.8°F | Wt 125.0 lb

## 2012-11-04 DIAGNOSIS — R51 Headache: Secondary | ICD-10-CM

## 2012-11-04 MED ORDER — AMITRIPTYLINE HCL 10 MG PO TABS: 10.0000 mg | ORAL_TABLET | Freq: Every day | ORAL | Status: DC

## 2012-11-04 MED ORDER — SUMATRIPTAN SUCCINATE 50 MG PO TABS: 50.0000 mg | ORAL_TABLET | ORAL | Status: DC | PRN

## 2012-11-04 NOTE — Assessment & Plan Note (Addendum)
Patient having increase in frequency of headaches. Most of her headaches sound like tension however she had more severe episode this past Tuesday consistent with migraine headache. Use Imitrex 50 mg as directed. Restart amitriptyline 10 mg at bedtime for headache prophylaxis.  Reassess in 4 weeks. Previous MRI of brain showed 08/28/2011 - essentially normal MRI brain without contrast. 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

## 2012-11-04 NOTE — Progress Notes (Signed)
  Subjective:    Patient ID: Valerie Ruiz, female    DOB: 07-Oct-1975, 37 y.o.   MRN: [id number]  HPI  37 year old white female with history of migraine headaches for followup. Patient reports it has been a long time since she's had recurrence of migraine headaches. However over the last one month she reports 3-4 headaches per week. Headaches are usually mild and resolve with using over-the-counter ibuprofen. However this past Tuesday patient had headache that was more severe and headache lasted into Wednesday morning. Her more severe headache was localized to right side of her head. She denies any associated vomiting or nausea. With her previous migraines, she did does not have photophobia or vomiting.  She denies any recent sinus issues. No change in life stressors. She reports intermittent sleep disturbances. No specific trigger.  Review of Systems Negative for visual complaints, negative for fever or chills 2 cups of coffee per day.  More on weekends.  No change in caffeine habit    Past Medical History  Diagnosis Date  . Allergic rhinitis   . Asthma   . Chronic headaches     History   Social History  . Marital Status: Single    Spouse Name: N/A    Number of Children: N/A  . Years of Education: N/A   Occupational History  . Not on file.   Social History Main Topics  . Smoking status: Never Smoker   . Smokeless tobacco: Not on file  . Alcohol Use: 1.5 oz/week    3 drink(s) per week  . Drug Use: Not on file  . Sexual Activity: Not on file   Other Topics Concern  . Not on file   Social History Narrative  . No narrative on file    No past surgical history on file.  Family History  Problem Relation Age of Onset  . Hyperthyroidism Mother   . Breast cancer Paternal Grandmother     No Known Allergies  No current outpatient prescriptions on file prior to visit.   No current facility-administered medications on file prior to visit.    BP 110/70  Pulse 88   Temp(Src) 97.8 F (36.6 C) (Oral)  Wt 125 lb (56.7 kg)  BMI 22.15 kg/m2    Objective:   Physical Exam  Constitutional: She is oriented to person, place, and time. She appears well-developed and well-nourished.  HENT:  Head: Normocephalic and atraumatic.  Right Ear: External ear normal.  Left Ear: External ear normal.  Mouth/Throat: Oropharynx is clear and moist.  Eyes: EOM are normal. Pupils are equal, round, and reactive to light.  Normal peripheral vision  Neck: Neck supple. No thyromegaly present.  Cardiovascular: Normal rate, regular rhythm and normal heart sounds.   No murmur heard. Pulmonary/Chest: Effort normal and breath sounds normal. She has no wheezes.  Neurological: She is alert and oriented to person, place, and time. She has normal reflexes. She displays normal reflexes. No cranial nerve deficit. She exhibits normal muscle tone.  Skin: Skin is warm and dry.  Psychiatric: She has a normal mood and affect. Her behavior is normal.          Assessment & Plan:

## 2013-01-12 ENCOUNTER — Ambulatory Visit (INDEPENDENT_AMBULATORY_CARE_PROVIDER_SITE_OTHER): Payer: BC Managed Care – PPO | Admitting: Internal Medicine

## 2013-01-12 ENCOUNTER — Encounter: Payer: Self-pay | Admitting: Internal Medicine

## 2013-01-12 VITALS — BP 112/70 | HR 92 | Temp 97.9°F | Ht 63.0 in | Wt 125.0 lb

## 2013-01-12 DIAGNOSIS — R51 Headache: Secondary | ICD-10-CM

## 2013-01-12 DIAGNOSIS — R002 Palpitations: Secondary | ICD-10-CM

## 2013-01-12 NOTE — Progress Notes (Signed)
Pre-visit discussion using our clinic review tool. No additional management support is needed unless otherwise documented below in the visit note.  

## 2013-01-13 NOTE — Progress Notes (Signed)
  Subjective:    Patient ID: Valerie Ruiz, female    DOB: 1975/08/31, 37 y.o.   MRN: [id number]  HPI  37 year old while female for ER follow up.  She was seen at [facility] on 12/28/2012 re: palpitations.  Her symptoms started while she was leaving work that day.  She reports "it felt like I was have a panic attack".  Later that day, she tried going to the gym but her symptoms got worse.  Her heart was "pounding and skipping bits".  She denies any associated chest pain but recalls mild dizziness.  She denies any recent OTC medication use.  She does not drink or smoke.  No illicit drug use.  Some stress at work.  She was seen in ER.  EKG shows NSR at 93 bpm, otherwise normal.  Medical records from [facility] reviewed: D Dimer, BMET, CBCD, TSH, CXR, Trop I - WNL  Her symptoms resolved on their own with 48 hrs.  She has not had recurrence.  Review of Systems Negative for hx of exercise intolerlance.  No unusual shortness of breath Past Medical History  Diagnosis Date  . Allergic rhinitis   . Asthma   . Chronic headaches     History   Social History  . Marital Status: Single    Spouse Name: N/A    Number of Children: N/A  . Years of Education: N/A   Occupational History  . Not on file.   Social History Main Topics  . Smoking status: Never Smoker   . Smokeless tobacco: Not on file  . Alcohol Use: 1.5 oz/week    3 drink(s) per week  . Drug Use: Not on file  . Sexual Activity: Not on file   Other Topics Concern  . Not on file   Social History Narrative  . No narrative on file    No past surgical history on file.  Family History  Problem Relation Age of Onset  . Hyperthyroidism Mother   . Breast cancer Paternal Grandmother     No Known Allergies  Current Outpatient Prescriptions on File Prior to Visit  Medication Sig Dispense Refill  . amitriptyline (ELAVIL) 10 MG tablet Take 1 tablet (10 mg total) by mouth at bedtime.  90 tablet  1   No current  facility-administered medications on file prior to visit.    BP 112/70  Pulse 92  Temp(Src) 97.9 F (36.6 C) (Oral)  Ht 5\' 3"  (1.6 m)  Wt 125 lb (56.7 kg)  BMI 22.15 kg/m2       Objective:   Physical Exam  Constitutional: She is oriented to person, place, and time. She appears well-developed and well-nourished. No distress.  HENT:  Head: Normocephalic and atraumatic.  Mouth/Throat: Oropharynx is clear and moist.  Eyes: Conjunctivae and EOM are normal. Pupils are equal, round, and reactive to light.  Neck: Neck supple.  No carotid bruit  Cardiovascular: Normal rate, regular rhythm, normal heart sounds and intact distal pulses.   No murmur heard. Pulmonary/Chest: Effort normal and breath sounds normal. She has no wheezes.  Musculoskeletal: She exhibits no edema.  Lymphadenopathy:    She has no cervical adenopathy.  Neurological: She is alert and oriented to person, place, and time. No cranial nerve deficit.  Psychiatric: She has a normal mood and affect. Her behavior is normal.          Assessment & Plan:

## 2013-01-13 NOTE — Assessment & Plan Note (Signed)
Recently seen ER for symptomatic palpitations. EKG unremarkable.  She has normal exercise tolerance.  Normal cardiac exam.  Observe for now.  Patient advised to reduce caffeine intake.  Unclear if her symptoms related to TCA use. If persistent symptoms, we discussed changing TCA to low dose beta blocker.

## 2013-01-13 NOTE — Assessment & Plan Note (Signed)
Improved with regular use of amitriptyline

## 2013-01-14 ENCOUNTER — Telehealth: Payer: Self-pay | Admitting: *Deleted

## 2013-01-14 MED ORDER — METOPROLOL TARTRATE 25 MG PO TABS: 12.5000 mg | ORAL_TABLET | Freq: Two times a day (BID) | ORAL | Status: DC

## 2013-01-14 NOTE — Telephone Encounter (Signed)
Message copied by Jacqualyn Posey on Fri Jan 14, 2013 10:18 AM ------      Message from: Meda Coffee      Created: Thu Jan 13, 2013  8:19 AM       Call pt - stop taking amitriptyline.  Switch to metoprolol tartrate 25 mg 1/2 tab bid.  #60.  RF x 1.             I would like to see patient back in office in 6 weeks.            thx ------

## 2013-01-14 NOTE — Telephone Encounter (Signed)
Pt aware, appt scheduled, rx sent in electronically

## 2013-01-14 NOTE — Telephone Encounter (Signed)
Left message for pt to call back  °

## 2013-01-14 NOTE — Addendum Note (Signed)
Addended by: Alfred Levins D on: 01/14/2013 11:25 AM   Modules accepted: Orders, Medications

## 2013-01-17 ENCOUNTER — Encounter: Payer: Self-pay | Admitting: Internal Medicine

## 2013-02-09 ENCOUNTER — Telehealth: Payer: Self-pay | Admitting: Internal Medicine

## 2013-02-09 NOTE — Telephone Encounter (Signed)
Pt is returning your phone call. Please call pt back at [phone] ext [phone]. Thanks

## 2013-02-25 ENCOUNTER — Ambulatory Visit: Payer: BC Managed Care – PPO | Admitting: Internal Medicine

## 2013-03-04 ENCOUNTER — Encounter: Payer: Self-pay | Admitting: Internal Medicine

## 2013-03-04 ENCOUNTER — Ambulatory Visit (INDEPENDENT_AMBULATORY_CARE_PROVIDER_SITE_OTHER): Payer: BC Managed Care – PPO | Admitting: Internal Medicine

## 2013-03-04 VITALS — BP 130/68 | HR 72 | Temp 97.7°F | Wt 126.0 lb

## 2013-03-04 DIAGNOSIS — R002 Palpitations: Secondary | ICD-10-CM

## 2013-03-04 MED ORDER — METOPROLOL TARTRATE 25 MG PO TABS: 12.5000 mg | ORAL_TABLET | Freq: Two times a day (BID) | ORAL | Status: DC

## 2013-03-04 NOTE — Assessment & Plan Note (Signed)
Patient's palpitations significantly improved since discontinuing amitriptyline. She is now taking metoprolol for migraine prophylaxis. Metoprolol is having additional benefit of helping with her mild anxiety symptoms. Continue same dose. Reassess in 6 months.

## 2013-03-04 NOTE — Progress Notes (Signed)
Pre visit review using our clinic review tool, if applicable. No additional management support is needed unless otherwise documented below in the visit note. 

## 2013-03-04 NOTE — Progress Notes (Signed)
   Subjective:    Patient ID: Valerie Ruiz, female    DOB: May 06, 1975, 38 y.o.   MRN: [id number]  HPI  38 year old white female for followup regarding palpitations and migraine headache. At previous visit her amitriptyline was discontinued. She was switched to metoprolol 25 mg one tablet twice daily.  Her palpitations have significantly decreased.  Metoprolol also helping her migraines as well as mild issues with anxiety   Review of Systems She is exercising normally,  No issues with exercise intolerance    Past Medical History  Diagnosis Date  . Allergic rhinitis   . Asthma   . Chronic headaches     History   Social History  . Marital Status: Single    Spouse Name: N/A    Number of Children: N/A  . Years of Education: N/A   Occupational History  . Not on file.   Social History Main Topics  . Smoking status: Never Smoker   . Smokeless tobacco: Not on file  . Alcohol Use: 1.5 oz/week    3 drink(s) per week  . Drug Use: Not on file  . Sexual Activity: Not on file   Other Topics Concern  . Not on file   Social History Narrative  . No narrative on file    No past surgical history on file.  Family History  Problem Relation Age of Onset  . Hyperthyroidism Mother   . Breast cancer Paternal Grandmother     No Known Allergies  No current outpatient prescriptions on file prior to visit.   No current facility-administered medications on file prior to visit.    BP 130/68  Pulse 72  Temp(Src) 97.7 F (36.5 C) (Oral)  Wt 126 lb (57.153 kg)  SpO2 99%    Objective:   Physical Exam  Constitutional: She is oriented to person, place, and time. She appears well-developed and well-nourished.  Cardiovascular: Normal rate, regular rhythm and normal heart sounds.   No murmur heard. Pulmonary/Chest: Effort normal and breath sounds normal. She has no wheezes.  Neurological: She is alert and oriented to person, place, and time. No cranial nerve deficit.    Psychiatric: She has a normal mood and affect. Her behavior is normal.      Assessment & Plan:

## 2013-06-09 ENCOUNTER — Encounter: Payer: Self-pay | Admitting: Emergency Medicine

## 2013-06-09 ENCOUNTER — Emergency Department
Admission: EM | Admit: 2013-06-09 | Discharge: 2013-06-09 | Disposition: A | Discharge status: Home / Self Care | Payer: BC Managed Care – PPO | Source: Home / Self Care | Attending: Family Medicine | Admitting: Family Medicine

## 2013-06-09 ENCOUNTER — Emergency Department (INDEPENDENT_AMBULATORY_CARE_PROVIDER_SITE_OTHER): Payer: BC Managed Care – PPO

## 2013-06-09 DIAGNOSIS — R109 Unspecified abdominal pain: Secondary | ICD-10-CM

## 2013-06-09 LAB — POCT URINALYSIS DIP (MANUAL ENTRY)
BILIRUBIN UA: NEGATIVE
GLUCOSE UA: NEGATIVE
Ketones, POC UA: NEGATIVE
Leukocytes, UA: NEGATIVE
Nitrite, UA: NEGATIVE
Protein Ur, POC: NEGATIVE
RBC UA: NEGATIVE
SPEC GRAV UA: 1.02 (ref 1.005–1.03)
Urobilinogen, UA: 0.2 (ref 0–1)
pH, UA: 5.5 (ref 5–8)

## 2013-06-09 LAB — POCT CBC W AUTO DIFF (K'VILLE URGENT CARE)

## 2013-06-09 LAB — POCT URINE PREGNANCY: Preg Test, Ur: NEGATIVE

## 2013-06-09 MED ORDER — PANTOPRAZOLE SODIUM 40 MG PO TBEC: 40.0000 mg | DELAYED_RELEASE_TABLET | Freq: Every day | ORAL | Status: DC

## 2013-06-09 MED ORDER — ONDANSETRON 4 MG PO TBDP: 4.0000 mg | ORAL_TABLET | Freq: Three times a day (TID) | ORAL | Status: DC | PRN

## 2013-06-09 NOTE — ED Provider Notes (Signed)
CSN: [id number]     Arrival date & time 06/09/13  1628 History   First MD Initiated Contact with Patient 06/09/13 1629     Chief Complaint  Patient presents with  . Diarrhea    HPI  Diarrhea: Onset: 1 week  Description: 1 week intermittent abd pain, nausea, NBNB vomiting, intermittent loose stools  Modifying factors: high dose NSAID use recently   Symptoms: Incontinence: no Vomiting: no Abdominal Pain: intermittent, usually mild, post prandial. Epigastric in distibution. No radiation to the back.  Urgency: no Relief with defecation: no Weight loss: no Decreased urine output: no Lightheadedness: no Recent travel history: no Sick contacts: no Suspicious food or water: no Change in diet: no    Red Flags: Fever: no Bloody stools: no Recent antibiotics: no Immuncompromised: no   Past Medical History  Diagnosis Date  . Allergic rhinitis   . Asthma   . Chronic headaches    History reviewed. No pertinent past surgical history. Family History  Problem Relation Age of Onset  . Hyperthyroidism Mother   . Breast cancer Paternal Grandmother    History  Substance Use Topics  . Smoking status: Never Smoker   . Smokeless tobacco: Not on file  . Alcohol Use: 1.5 oz/week    3 drink(s) per week   OB History   Grav Para Term Preterm Abortions TAB SAB Ect Mult Living                 Review of Systems  All other systems reviewed and are negative.   Allergies  Review of patient's allergies indicates not on file.  Home Medications   Current Outpatient Rx  Name  Route  Sig  Dispense  Refill  . metoprolol tartrate (LOPRESSOR) 25 MG tablet   Oral   Take 0.5 tablets (12.5 mg total) by mouth 2 (two) times daily.   180 tablet   1    BP 128/81  Pulse 86  Temp(Src) 97.6 F (36.4 C) (Oral)  Ht 5\' 2"  (1.575 m)  Wt 122 lb (55.339 kg)  BMI 22.31 kg/m2  SpO2 100%  LMP 05/25/2013 Physical Exam  Constitutional: She is oriented to person, place, and time. She appears  well-developed and well-nourished.  HENT:  Head: Normocephalic and atraumatic.  Eyes: Conjunctivae are normal. Pupils are equal, round, and reactive to light.  Neck: Normal range of motion. Neck supple.  Cardiovascular: Normal rate and regular rhythm.   Pulmonary/Chest: Effort normal and breath sounds normal.  Abdominal: Soft. Bowel sounds are normal. She exhibits no distension. There is no tenderness. There is no rebound and no guarding.  Musculoskeletal: Normal range of motion.  Neurological: She is alert and oriented to person, place, and time.  Skin: Skin is warm.    ED Course  Procedures (including critical care time) Labs Review Labs Reviewed - No data to display Imaging Review Dg Abd 1 View  06/09/2013   CLINICAL DATA:  Abdominal pain, recurrent diarrhea  EXAM: ABDOMEN - 1 VIEW  COMPARISON:  None.  FINDINGS: Relative paucity of bowel gas. No significant dilatation, ileus, or obstruction pattern. No abnormal calcifications or osseous abnormality.  IMPRESSION: No acute finding by plain radiography.  Negative for obstruction.   Electronically Signed   By: Ruel Favorsrevor  Shick M.D.   On: 06/09/2013 17:14     MDM   1. Abdominal pain, unspecified site    Reassuring exam today. No red flags.  Given distribution of abd pain, ddx includes gastritis, GB disease, panreatitis. High concern  for gastritis given high dose NSAID use. Start on ppi. Check labwork including CBC, CMET, lipase.  KUB, UA, u preg WNL.  Check abd u/s. Follow up pending imaging.  Diarrhea more so like loose BMs (1-2 loose BMs per day). Check stool studies.  Will add on abx if abd pain/diarrhea worsens. Go to ER if abd severely spikes.  WBC 5.8 today.  No clinical indication for abx currently.  Follow up with PCP in am.  Discussed infectious and GI red flags at length.     The patient and/or caregiver has been counseled thoroughly with regard to treatment plan and/or medications prescribed including dosage, schedule,  interactions, rationale for use, and possible side effects and they verbalize understanding. Diagnoses and expected course of recovery discussed and will return if not improved as expected or if the condition worsens. Patient and/or caregiver verbalized understanding.            Doree Albee, MD 06/09/13 [phone]

## 2013-06-09 NOTE — ED Notes (Signed)
Diarrhea, nausea x 10 days intermittent

## 2013-06-10 ENCOUNTER — Ambulatory Visit (INDEPENDENT_AMBULATORY_CARE_PROVIDER_SITE_OTHER): Payer: BC Managed Care – PPO

## 2013-06-10 ENCOUNTER — Telehealth: Payer: Self-pay | Admitting: *Deleted

## 2013-06-10 DIAGNOSIS — R109 Unspecified abdominal pain: Secondary | ICD-10-CM

## 2013-06-10 LAB — COMPREHENSIVE METABOLIC PANEL
ALK PHOS: 47 U/L (ref 39–117)
ALT: 15 U/L (ref 0–35)
AST: 18 U/L (ref 0–37)
Albumin: 4.4 g/dL (ref 3.5–5.2)
BILIRUBIN TOTAL: 1.2 mg/dL (ref 0.2–1.2)
BUN: 11 mg/dL (ref 6–23)
CO2: 27 mEq/L (ref 19–32)
Calcium: 9.4 mg/dL (ref 8.4–10.5)
Chloride: 102 mEq/L (ref 96–112)
Creat: 0.55 mg/dL (ref 0.50–1.10)
Glucose, Bld: 83 mg/dL (ref 70–99)
Potassium: 3.7 mEq/L (ref 3.5–5.3)
SODIUM: 138 meq/L (ref 135–145)
TOTAL PROTEIN: 7.2 g/dL (ref 6.0–8.3)

## 2013-06-10 LAB — LIPASE: Lipase: 23 U/L (ref 0–75)

## 2013-06-15 ENCOUNTER — Encounter: Payer: Self-pay | Admitting: Internal Medicine

## 2013-06-15 ENCOUNTER — Ambulatory Visit (INDEPENDENT_AMBULATORY_CARE_PROVIDER_SITE_OTHER): Payer: BC Managed Care – PPO | Admitting: Internal Medicine

## 2013-06-15 VITALS — BP 104/72 | HR 68 | Temp 98.1°F | Ht 62.0 in | Wt 124.0 lb

## 2013-06-15 DIAGNOSIS — K529 Noninfective gastroenteritis and colitis, unspecified: Secondary | ICD-10-CM

## 2013-06-15 DIAGNOSIS — K5289 Other specified noninfective gastroenteritis and colitis: Secondary | ICD-10-CM

## 2013-06-15 NOTE — Progress Notes (Signed)
Pre visit review using our clinic review tool, if applicable. No additional management support is needed unless otherwise documented below in the visit note. 

## 2013-06-15 NOTE — Assessment & Plan Note (Signed)
38 year old white female for Emergency room followup for symptoms of acute gastroenteritis. Patient's symptoms resolved on its own (self limited). Unclear whether her symptoms triggered by viral versus bacterial etiology. She does not have any persistent symptoms.  No secondary joint pain or rash.  She complains of mild intermittent loose stools. Patient advised to avoid raw foods and try over-the-counter probiotic supplement.  Patient advised to call office if symptoms persist or worsen.

## 2013-06-15 NOTE — Progress Notes (Signed)
   Subjective:    Patient ID: Valerie MastersShannon L Farrington, female    DOB: [birth year], 38 y.o.   MRN: [id number]  HPI  38 year old white female for emergency room followup. Patient was seen on 06/09/2013 secondary to nausea, abdominal pain and diarrhea. No history of recent foreign travel or unusual food intake. She also denies sick contacts. She sought emergency medical care when her symptoms persisted over 7 days.  Soon after ER reevaluation patient reports her symptoms started to improve.  She never use Zofran or Protonix.  She has no further issues with nausea or vomiting. She has mild intermittent loose stools. She denies any joint pain or unusual rash.   Review of Systems Negative for bloody diarrhea, negative for joint pains or rash    Past Medical History  Diagnosis Date  . Allergic rhinitis   . Asthma   . Chronic headaches     History   Social History  . Marital Status: Single    Spouse Name: N/A    Number of Children: N/A  . Years of Education: N/A   Occupational History  . Not on file.   Social History Main Topics  . Smoking status: Never Smoker   . Smokeless tobacco: Not on file  . Alcohol Use: 1.5 oz/week    3 drink(s) per week  . Drug Use: Not on file  . Sexual Activity: Not on file   Other Topics Concern  . Not on file   Social History Narrative  . No narrative on file    No past surgical history on file.  Family History  Problem Relation Age of Onset  . Hyperthyroidism Mother   . Breast cancer Paternal Grandmother     Not on File  Current Outpatient Prescriptions on File Prior to Visit  Medication Sig Dispense Refill  . metoprolol tartrate (LOPRESSOR) 25 MG tablet Take 0.5 tablets (12.5 mg total) by mouth 2 (two) times daily.  180 tablet  1   No current facility-administered medications on file prior to visit.    BP 104/72  Pulse 68  Temp(Src) 98.1 F (36.7 C) (Oral)  Ht 5\' 2"  (1.575 m)  Wt 124 lb (56.246 kg)  BMI 22.67 kg/m2  LMP  05/25/2013    Objective:   Physical Exam  Constitutional: She is oriented to person, place, and time. She appears well-developed and well-nourished. No distress.  Cardiovascular: Normal rate, regular rhythm and normal heart sounds.   No murmur heard. Pulmonary/Chest: Effort normal and breath sounds normal. She has no wheezes.  Abdominal: Soft. Bowel sounds are normal. There is no tenderness.  Musculoskeletal: She exhibits no edema.  Neurological: She is alert and oriented to person, place, and time.  Psychiatric: She has a normal mood and affect. Her behavior is normal.       Assessment & Plan:

## 2013-06-15 NOTE — Patient Instructions (Signed)
Avoid raw vegetables for 1-2 weeks Try taking OTC probiotic Please contact our office if your symptoms do not improve or gets worse.

## 2013-08-12 ENCOUNTER — Ambulatory Visit (INDEPENDENT_AMBULATORY_CARE_PROVIDER_SITE_OTHER): Payer: BC Managed Care – PPO | Admitting: Internal Medicine

## 2013-08-12 ENCOUNTER — Encounter: Payer: Self-pay | Admitting: Internal Medicine

## 2013-08-12 VITALS — BP 120/80 | HR 68 | Temp 98.4°F | Wt 124.0 lb

## 2013-08-12 DIAGNOSIS — R51 Headache: Secondary | ICD-10-CM

## 2013-08-12 DIAGNOSIS — R002 Palpitations: Secondary | ICD-10-CM

## 2013-08-12 MED ORDER — METOPROLOL TARTRATE 25 MG PO TABS: 12.5000 mg | ORAL_TABLET | Freq: Two times a day (BID) | ORAL | Status: DC

## 2013-08-12 MED ORDER — METHOCARBAMOL 500 MG PO TABS: 500.0000 mg | ORAL_TABLET | Freq: Three times a day (TID) | ORAL | Status: DC | PRN

## 2013-08-12 MED ORDER — HYDROCODONE-ACETAMINOPHEN 5-325 MG PO TABS: 1.0000 | ORAL_TABLET | Freq: Two times a day (BID) | ORAL | Status: DC | PRN

## 2013-08-12 NOTE — Progress Notes (Signed)
Subjective:    Patient ID: Valerie Ruiz, female    DOB: January 25, 1976, 38 y.o.   MRN: [id number]  HPI  38 year old white female with history of chronic headaches previously seen for palpitations for followup. Patient reports her palpitations have improved but taking metoprolol.   However patient reports significant exacerbation of headaches started 1-2 weeks ago. Her headaches are slightly unusual in that now she is experiencing associated right sided jaw pain and pressure behind the right eye. She denies any double vision or other visual changes. Patient's previous headaches usually localized to right occipital area. Patient reports she has woken up in the morning with excruciating headache and pain behind the back of her head. Changing positions (going from sitting to standing) can trigger pain and back of her head. She has tried over-the-counter ibuprofen with temporary relief for her symptoms.  Patient previously evaluated by neurologist-Dr. Terrace ArabiaYan in 2013. MRI of brain without contrast showed essentially normal MRI of brain. Punctate left frontal parasagittal focus of gliosis of unclear significance noted.  Review of Systems Negative for preceding illness, she denies fever or chills    Past Medical History  Diagnosis Date  . Allergic rhinitis   . Asthma   . Chronic headaches     History   Social History  . Marital Status: Single    Spouse Name: N/A    Number of Children: N/A  . Years of Education: N/A   Occupational History  . Not on file.   Social History Main Topics  . Smoking status: Never Smoker   . Smokeless tobacco: Not on file  . Alcohol Use: 1.5 oz/week    3 drink(s) per week  . Drug Use: Not on file  . Sexual Activity: Not on file   Other Topics Concern  . Not on file   Social History Narrative  . No narrative on file    No past surgical history on file.  Family History  Problem Relation Age of Onset  . Hyperthyroidism Mother   . Breast cancer  Paternal Grandmother     No Known Allergies  Current Outpatient Prescriptions on File Prior to Visit  Medication Sig Dispense Refill  . metoprolol tartrate (LOPRESSOR) 25 MG tablet Take 0.5 tablets (12.5 mg total) by mouth 2 (two) times daily.  180 tablet  1   No current facility-administered medications on file prior to visit.    BP 120/80  Pulse 68  Temp(Src) 98.4 F (36.9 C) (Oral)  Wt 124 lb (56.246 kg)  LMP 07/22/2013    Objective:   Physical Exam  Constitutional: She is oriented to person, place, and time. She appears well-developed and well-nourished. No distress.  HENT:  Head: Normocephalic and atraumatic.  Left Ear: External ear normal.  No defects in peripheral vision  Eyes: Conjunctivae and EOM are normal. Pupils are equal, round, and reactive to light. No scleral icterus.  Neck: Neck supple.  Cardiovascular: Normal rate, regular rhythm and normal heart sounds.   No murmur heard. Pulmonary/Chest: Effort normal and breath sounds normal. She has no wheezes.  Musculoskeletal: She exhibits no edema.  Lymphadenopathy:    She has no cervical adenopathy.  Neurological: She is alert and oriented to person, place, and time. She has normal reflexes. She displays normal reflexes. No cranial nerve deficit.  No pronator drift, no dysmetria, negative Romberg, normal gait  Skin: Skin is warm and dry.  Psychiatric: She has a normal mood and affect. Her behavior is normal.  Assessment & Plan:

## 2013-08-12 NOTE — Assessment & Plan Note (Signed)
38 year old white female with previous history of possible tension migraine reports severe exacerbation within the last 1 to 2 weeks. She now has associated pressure behind right eye and right jaw pain. She also has positional symptoms that trigger severe pain in the lower occipital area.  Arrange neurologic consultation.    Defer MRI of C spine and MRI of brain to neurologist.  Use robaxin and hydrocodone for now.    Patient advised to call office if symptoms persist or worsen.

## 2013-08-12 NOTE — Progress Notes (Signed)
Pre visit review using our clinic review tool, if applicable. No additional management support is needed unless otherwise documented below in the visit note. 

## 2013-08-12 NOTE — Assessment & Plan Note (Signed)
Improved.  Continue same dose of metoprolol.

## 2013-08-25 ENCOUNTER — Encounter: Payer: Self-pay | Admitting: *Deleted

## 2013-08-29 ENCOUNTER — Encounter: Payer: Self-pay | Admitting: Internal Medicine

## 2013-08-29 ENCOUNTER — Ambulatory Visit (INDEPENDENT_AMBULATORY_CARE_PROVIDER_SITE_OTHER): Payer: BC Managed Care – PPO | Admitting: Neurology

## 2013-08-29 ENCOUNTER — Encounter: Payer: Self-pay | Admitting: Neurology

## 2013-08-29 VITALS — BP 102/70 | HR 76 | Temp 98.3°F | Resp 18 | Ht 65.0 in | Wt 126.3 lb

## 2013-08-29 DIAGNOSIS — G44229 Chronic tension-type headache, not intractable: Secondary | ICD-10-CM

## 2013-08-29 MED ORDER — CYCLOBENZAPRINE HCL 10 MG PO TABS: ORAL_TABLET | ORAL | Status: DC

## 2013-08-29 MED ORDER — AMITRIPTYLINE HCL 10 MG PO TABS: 10.0000 mg | ORAL_TABLET | Freq: Every day | ORAL | Status: DC

## 2013-08-29 NOTE — Patient Instructions (Signed)
1.  Start amitriptyline 10mg  at bedtime. 2.  Stop Robaxin.  Take cyclobenzaprine 1/2 tab to 1 full tablet up to three times daily as needed.  Caution as it may cause drowsiness. 3.  Call in 4 weeks with update.  Follow up in 3 months.

## 2013-08-29 NOTE — Progress Notes (Signed)
NEUROLOGY CONSULTATION NOTE  Valerie MastersShannon L Ruiz MRN: [id number] DOB: 01/15/1976  Referring provider: Dr. Artist PaisYoo Primary care provider: Dr. Artist PaisYoo  Reason for consult:  headache  HISTORY OF PRESENT ILLNESS: Valerie Ruiz is a 38 year old right-handed woman with history of asthma, palpitations, gastroenteritis and chronic headaches who presents for headache.  Records were personally reviewed.  Onset:  Off and on for several years.  This episode started about 4 weeks ago. Location:  Suboccipital region.  Initially a tender focal spot on the right side and radiated to front.  Now bi-suboccipital and non-radiating.  Some neck tightness.  No pain or numbness involving the arms or hands. Quality:  Dull, nagging Intensity:  4-5/10 Aura:  no Prodrome:  no Associated symptoms:  none Duration:  Throughout the day (may wake up with it or it can start later in the day) Frequency:  Almost daily Triggers/exacerbating factors:  Laying supine with head on pillow.  Not triggered by neck movement or change in position. Relieving factors:  none Activity:  Able to function  Past abortive therapy:  Cyclobenzaprine 5mg  (helpful) Past preventative therapy:  Amitriptyline 10mg  daily (was on it for a year.  Helpful)  Current abortive therapy:  Robaxin 500mg  (not too effective), Advil (helps briefly) Current preventative therapy:  none Other medications:  metoprolol 12.5mg  (for palpitations)  Caffeine:  16 oz coffee daily Alcohol:  no Smoker:  no Diet:  good Exercise:  good Depression/stress:  good Sleep hygiene:  good Family history of headache:  Mom (migraines) She does have history of typical migraines associated with nausea, which are different than this headache Past history:  She was [facility]an [facility] in June 2013 for acute onset left hand and foot numbness.  Exam was normal and imaging was unremarkable.  08/21/11 MRI of the brain wo contrast:  essentially normal with incidental left frontal parasagittal focus of gliosis of unclear significance.  PAST MEDICAL HISTORY: Past Medical History  Diagnosis Date  . Allergic rhinitis   . Asthma   . Chronic headaches     PAST SURGICAL HISTORY: No past surgical history on file.  MEDICATIONS: Current Outpatient Prescriptions on File Prior to Visit  Medication Sig Dispense Refill  . HYDROcodone-acetaminophen (NORCO/VICODIN) 5-325 MG per tablet Take 1 tablet by mouth every 12 (twelve) hours as needed for moderate pain.  30 tablet  0  . metoprolol tartrate (LOPRESSOR) 25 MG tablet Take 0.5 tablets (12.5 mg total) by mouth 2 (two) times daily.  180 tablet  1   No current facility-administered medications on file prior to visit.    ALLERGIES: No Known Allergies  FAMILY HISTORY: Family History  Problem Relation Age of Onset  . Hyperthyroidism Mother   . Breast cancer Paternal Grandmother     SOCIAL HISTORY: History   Social History  . Marital Status: Single    Spouse Name: N/A    Number of Children: N/A  . Years of Education: N/A   Occupational History  . Not on file.   Social History Main Topics  . Smoking status: Never Smoker   . Smokeless tobacco: Not on file  . Alcohol Use: 1.5 oz/week    3 drink(s) per week  . Drug Use: No  . Sexual Activity: Yes    Partners: Female   Other Topics Concern  . Not on file   Social History Narrative  . No narrative on file    REVIEW OF SYSTEMS: Constitutional: No fevers, chills, or sweats,  no generalized fatigue, change in appetite Eyes: No visual changes, double vision, eye pain Ear, nose and throat: No hearing loss, ear pain, nasal congestion, sore throat Cardiovascular: No chest pain, palpitations Respiratory:  No shortness of breath at rest or with exertion, wheezes GastrointestinaI: No nausea, vomiting, diarrhea, abdominal pain, fecal incontinence Genitourinary:  No dysuria, urinary retention or  frequency Musculoskeletal:  No neck pain, back pain Integumentary: No rash, pruritus, skin lesions Neurological: as above Psychiatric: No depression, insomnia, anxiety Endocrine: No palpitations, fatigue, diaphoresis, mood swings, change in appetite, change in weight, increased thirst Hematologic/Lymphatic:  No anemia, purpura, petechiae. Allergic/Immunologic: no itchy/runny eyes, nasal congestion, recent allergic reactions, rashes  PHYSICAL EXAM: Filed Vitals:   08/29/13 1038  BP: 102/70  Pulse: 76  Temp: 98.3 F (36.8 C)  Resp: 18   General: No acute distress Head:  Normocephalic/atraumatic, no significant suboccipital tenderness. Neck: supple, no paraspinal tenderness, full range of motion Back: No paraspinal tenderness Heart: regular rate and rhythm Lungs: Clear to auscultation bilaterally. Vascular: No carotid bruits. Neurological Exam: Mental status: alert and oriented to person, place, and time, recent and remote memory intact, fund of knowledge intact, attention and concentration intact, speech fluent and not dysarthric, language intact. Cranial nerves: CN I: not tested CN II: pupils equal, round and reactive to light, visual fields intact, fundi unremarkable, without vessel changes, exudates, hemorrhages or papilledema. CN III, IV, VI:  full range of motion, no nystagmus, no ptosis CN V: facial sensation intact CN VII: upper and lower face symmetric CN VIII: hearing intact CN IX, X: gag intact, uvula midline CN XI: sternocleidomastoid and trapezius muscles intact CN XII: tongue midline Bulk & Tone: normal, no fasciculations. Motor: 5/5 throughout Sensation: temperature and vibration intact Deep Tendon Reflexes: 2+ throughout, toes down Finger to nose testing: no dysmetria Heel to shin: no dysmetria Gait: normal station and stride.  Able to turn and walk in tandem. Romberg negative.  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  PLAN: 1.  Will re-initiate amitriptyline 10mg  at bedtime, since it was previously effective. 2.  Stop Robaxin and retry cyclobenzaprine, since it was previously helpful 3.  May consider occipital nerve blocks. 4.  Call in 4 weeks with update.  Follow up in 3 months.  Thank you for allowing me to take part in the care of this patient.  Shon MilletAdam Jaffe, DO  CC:  Ethelene Haloe-Hyun Yoo, MD

## 2013-08-31 ENCOUNTER — Ambulatory Visit: Payer: BC Managed Care – PPO | Admitting: Internal Medicine

## 2013-09-14 ENCOUNTER — Other Ambulatory Visit: Payer: Self-pay | Admitting: Neurology

## 2013-09-14 ENCOUNTER — Encounter: Payer: Self-pay | Admitting: Neurology

## 2013-09-14 MED ORDER — TOPIRAMATE 25 MG PO TABS: 25.0000 mg | ORAL_TABLET | Freq: Every day | ORAL | Status: DC

## 2013-11-28 ENCOUNTER — Encounter: Payer: Self-pay | Admitting: Neurology

## 2013-11-28 ENCOUNTER — Ambulatory Visit (INDEPENDENT_AMBULATORY_CARE_PROVIDER_SITE_OTHER): Payer: BC Managed Care – PPO | Admitting: Neurology

## 2013-11-28 ENCOUNTER — Other Ambulatory Visit: Payer: Self-pay | Admitting: Internal Medicine

## 2013-11-28 ENCOUNTER — Ambulatory Visit: Payer: BC Managed Care – PPO | Admitting: Internal Medicine

## 2013-11-28 VITALS — BP 102/68 | HR 84 | Resp 16 | Ht 62.0 in | Wt 128.2 lb

## 2013-11-28 DIAGNOSIS — R51 Headache: Secondary | ICD-10-CM

## 2013-11-28 DIAGNOSIS — Z0289 Encounter for other administrative examinations: Secondary | ICD-10-CM

## 2013-11-28 DIAGNOSIS — G4486 Cervicogenic headache: Secondary | ICD-10-CM | POA: Insufficient documentation

## 2013-11-28 NOTE — Progress Notes (Signed)
NEUROLOGY FOLLOW UP OFFICE NOTE  Valerie Ruiz [id number]  HISTORY OF PRESENT ILLNESS: Valerie Ruiz is a 38 year old right-handed woman with history of asthma, palpitations, gastroenteritis and chronic headaches who follows up for tension-type headaches.  UPDATE: Intensity:  3-4/10 Duration: a day Frequency:  20 headache days per month  Current abortive therapy:  Advil, cyclobenzaprine  (if severe, but effective) Current preventative therapy:  None.  Stopped topamax  after 3 weeks because it gave her a different type of headache.  HISTORY: Onset:  Off and on for several years.  This episode started about 4 weeks ago. Location:  Suboccipital region.  Initially a tender focal spot on the right side and radiated to front.  Now bi-suboccipital and non-radiating.  Some neck tightness.  No pain or numbness involving the arms or hands. Quality:  Dull, nagging Intensity:  4-5/10 Aura:  no Prodrome:  no Associated symptoms:  none Duration:  Throughout the day (may wake up with it or it can start later in the day) Frequency:  Almost daily Triggers/exacerbating factors:  Laying supine with head on pillow.  Not triggered by neck movement or change in position. Relieving factors:  none Activity:  Able to function  Past abortive therapy:  Cyclobenzaprine  (helpful), Robaxin  (not effective) Past preventative therapy:  Amitriptyline  daily (palpitations)  Caffeine:  16 oz coffee daily Alcohol:  no Smoker:  no Diet:  good Exercise:  good Depression/stress:  good Sleep hygiene:  good Family history of headache:  Mom (migraines) She does have history of typical migraines associated with nausea, which are different than this headache Past history:  She was evaluated by Dr. Terrace Arabia at [facility] in June 2013 for acute onset left hand and foot numbness.  Exam was normal and imaging was unremarkable.  08/21/11 MRI of the brain wo contrast:  essentially normal with incidental left frontal parasagittal focus of gliosis of unclear significance.  PAST MEDICAL HISTORY: Past Medical History  Diagnosis Date  . Allergic rhinitis   . Asthma   . Chronic headaches     MEDICATIONS: Current Outpatient Prescriptions on File Prior to Visit  Medication Sig Dispense Refill  . cyclobenzaprine (FLEXERIL) 10 MG tablet Take 0.5-1tab TID prn headache  30 tablet  0  . metoprolol tartrate (LOPRESSOR) 25 MG tablet Take 0.5 tablets (12.5 mg total) by mouth 2 (two) times daily.  180 tablet  1  . amitriptyline (ELAVIL) 10 MG tablet Take 1 tablet (10 mg total) by mouth at bedtime.  30 tablet  0  . HYDROcodone-acetaminophen (NORCO/VICODIN) 5-325 MG per tablet Take 1 tablet by mouth every 12 (twelve) hours as needed for moderate pain.  30 tablet  0  . topiramate (TOPAMAX) 25 MG tablet Take 1 tablet (25 mg total) by mouth at bedtime.  30 tablet  0   No current facility-administered medications on file prior to visit.    ALLERGIES: No Known Allergies  FAMILY HISTORY: Family History  Problem Relation Age of Onset  . Hyperthyroidism Mother   . Breast cancer Paternal Grandmother     SOCIAL HISTORY: History   Social History  . Marital Status: Single    Spouse Name: N/A    Number of Children: N/A  . Years of Education: N/A   Occupational History  . Not on file.   Social History Main Topics  . Smoking status: Never Smoker   . Smokeless tobacco: Not on file  . Alcohol Use: 1.5 oz/week  3 drink(s) per week  . Drug Use: No  . Sexual Activity: Yes    Partners: Female   Other Topics Concern  . Not on file   Social History Narrative  . No narrative on file    REVIEW OF SYSTEMS: Constitutional: No fevers, chills, or sweats, no generalized fatigue, change in appetite Eyes: No visual changes, double vision, eye pain Ear, nose and throat: No hearing loss, ear pain, nasal congestion, sore throat Cardiovascular: No chest pain,  palpitations Respiratory:  No shortness of breath at rest or with exertion, wheezes GastrointestinaI: No nausea, vomiting, diarrhea, abdominal pain, fecal incontinence Genitourinary:  No dysuria, urinary retention or frequency Musculoskeletal:  No neck pain, back pain Integumentary: No rash, pruritus, skin lesions Neurological: as above Psychiatric: No depression, insomnia, anxiety Endocrine: No palpitations, fatigue, diaphoresis, mood swings, change in appetite, change in weight, increased thirst Hematologic/Lymphatic:  No anemia, purpura, petechiae. Allergic/Immunologic: no itchy/runny eyes, nasal congestion, recent allergic reactions, rashes  PHYSICAL EXAM: Filed Vitals:   11/28/13 0852  BP: 102/68  Pulse: 84  Resp: 16   General: No acute distress Head:  Normocephalic/atraumatic Neck: supple, no paraspinal tenderness, full range of motion Heart:  Regular rate and rhythm Lungs:  Clear to auscultation bilaterally Back: No paraspinal tenderness Neurological Exam: alert and oriented to person, place, and time. Attention span and concentration intact, recent and remote memory intact, fund of knowledge intact.  Speech fluent and not dysarthric, language intact.  CN II-XII intact. Fundoscopic exam unremarkable without vessel changes, exudates, hemorrhages or papilledema.  Bulk and tone normal, muscle strength 5/5 throughout.  Sensation to light touch, temperature and vibration intact.  Deep tendon reflexes 2+ throughout, toes downgoing.  Finger to nose and heel to shin testing intact.  Gait normal, Romberg negative.  IMPRESSION: Cervicogenic headache  PLAN: 1.  She is interested in non-pharmacologic therapies.  I will refer her to Dr. Antoine Primas for OMM 2.  Cyclobenzaprine and Advil.  Limit use of Advil to no more than 2 days out of the week to prevent rebound headache 3.  Follow up in 3 months  Shon Millet, DO  CC:  Roxanne Mins, MD

## 2013-11-28 NOTE — Patient Instructions (Signed)
1. I want to send you to Dr. Antoine Primas.  He performs osteopathic manipulative medicine, which is a technique similar to chiropractic.  I think the headaches are stemming from the neck so this should help. 2.  cyclopenzaprine for acute headache flares 3.  Call with any questions or concerns (such as needing to start a medication for the headache. 4.  Do not take advil or aleve more than 2 days out of the week to prevent rebound headache 5.  Follow up in 3 months.

## 2013-12-02 ENCOUNTER — Ambulatory Visit (INDEPENDENT_AMBULATORY_CARE_PROVIDER_SITE_OTHER): Payer: BC Managed Care – PPO | Admitting: Family Medicine

## 2013-12-02 ENCOUNTER — Encounter: Payer: Self-pay | Admitting: Family Medicine

## 2013-12-02 VITALS — BP 128/78 | HR 83 | Ht 62.0 in | Wt 128.0 lb

## 2013-12-02 DIAGNOSIS — M999 Biomechanical lesion, unspecified: Secondary | ICD-10-CM | POA: Insufficient documentation

## 2013-12-02 DIAGNOSIS — G2589 Other specified extrapyramidal and movement disorders: Secondary | ICD-10-CM

## 2013-12-02 DIAGNOSIS — M9903 Segmental and somatic dysfunction of lumbar region: Secondary | ICD-10-CM

## 2013-12-02 DIAGNOSIS — G4486 Cervicogenic headache: Secondary | ICD-10-CM

## 2013-12-02 DIAGNOSIS — M9902 Segmental and somatic dysfunction of thoracic region: Secondary | ICD-10-CM

## 2013-12-02 DIAGNOSIS — M9901 Segmental and somatic dysfunction of cervical region: Secondary | ICD-10-CM

## 2013-12-02 DIAGNOSIS — R51 Headache: Secondary | ICD-10-CM

## 2013-12-02 NOTE — Patient Instructions (Addendum)
Very nice to meet you Exercises 3 times a week.  On wall heels, butt shoulder and neck for goal of 5 minutes daily Vitamin D 2000 IU daily Ice after activity can help as well.  Tennis ball in tube sock and put under where neck meet head. Lay on them until release is felt.  Tennis ball duct tape to chair and hitting you between shoulder blades while sitting.  Monitor at eye level.  Come back in 3 weeks for another adjustment.     Scapular Winging  with Rehab  Scapular winging syndrome is also known as serratus anterior palsy or long thoracic nerve injury. The condition is an uncommon injury to the nervous system. The condition is caused by injury to the long thoracic nerve that runs through the neck and shoulder. Injury to the shoulder, such as a fall or repetitive stress on the shoulder causes the nerve to become stretched. Occasionally the injury is the result of an infection of the nerve. Damage to the long thoracic nerve results in weakness of the serratus anterior muscle. The serratus anterior muscle is responsible for controlling the shoulder blade (scapula). Weakness in this muscle results in a instability (winging) of the scapula. SYMPTOMS   Pain and weakness in the shoulder (usually the back of the shoulder) that is often diffuse or unable to localize.  Loss of or decrease in shoulder function.  Upper back pain while sitting, due to the scapula pressing on the back of the chair.  Visible deformity in the back of the shoulder. CAUSES  Scapular winging is caused by stretching of the long thoracic nerve. Common mechanisms of injury include:  Viral illness.  Repetitive and/or stressful use of the shoulder.  Falling onto the shoulder with the head and neck stretched away from the shoulder. RISK INCREASES WITH:  Contact sports (football, rugby, lacrosse, or soccer).  Activities involving overhead arm movement (baseball, volleyball, or racquet sports).  Poor strength and  flexibility. PREVENTION  Warm up and stretch properly before activity.  Allow for adequate recovery between workouts.  Maintain physical fitness:  Strength, flexibility, and endurance.  Cardiovascular fitness.  Learn and use proper technique. When possible, have a coach correct improper technique. PROGNOSIS  Scapular winging normally resolves spontaneously within 18 months. In rare circumstances surgery is recommended.  RELATED COMPLICATIONS   Permanent nerve damage, including pain, numbness, tingle, or weakness.  Shoulder weakness.  Recurrent shoulder pain.  Inability to compete in athletics. TREATMENT Treatment initially involves resting from any activities that aggravate your symptoms. The use of ice and medication may help reduce pain and inflammation. The use of strengthening and stretching exercises may help reduce pain with activity, specifically shoulder exercises that improve range of motion. These exercises may be performed at home or with referral to a therapist. If symptoms persist for greater than 6 months despite non-surgical (conservative) treatment, then surgery may be recommended. Surgery is only used for the most serious cases and the purpose is to regain function, not to allow an athlete to return to sports. MEDICATION   If pain medication is necessary, then nonsteroidal anti-inflammatory medications, such as aspirin and ibuprofen, or other minor pain relievers, such as acetaminophen, are often recommended.  Do not take pain medication for 7 days before surgery.  Prescription pain relievers may be given if deemed necessary by your caregiver. Use only as directed and only as much as you need. HEAT AND COLD  Cold treatment (icing) relieves pain and reduces inflammation. Cold treatment should  be applied for 10 to 15 minutes every 2 to 3 hours for inflammation and pain and immediately after any activity that aggravates your symptoms. Use ice packs or massage the  area with a piece of ice (ice massage).  Heat treatment may be used prior to performing the stretching and strengthening activities prescribed by your caregiver, physical therapist, or athletic trainer. Use a heat pack or soak the injury in warm water. SEEK MEDICAL CARE IF:  Treatment seems to offer no benefit, or the condition worsens.  Any medications produce adverse side effects. EXERCISES  RANGE OF MOTION (ROM) AND STRETCHING EXERCISES - Scapular Winging (Serratus Anterior Palsy, Long Thoracic Nerve Injury)  These exercises may help you when beginning to rehabilitate your injury. Your symptoms may resolve with or without further involvement from your physician, physical therapist or athletic trainer. While completing these exercises, remember:   Restoring tissue flexibility helps normal motion to return to the joints. This allows healthier, less painful movement and activity.  An effective stretch should be held for at least 30 seconds.  A stretch should never be painful. You should only feel a gentle lengthening or release in the stretched tissue. ROM - Pendulum  Bend at the waist so that your right / left arm falls away from your body. Support yourself with your opposite hand on a solid surface, such as a table or a countertop.  Your right / left arm should be perpendicular to the ground. If it is not perpendicular, you need to lean over farther. Relax the muscles in your right / left arm and shoulder as much as possible.  Gently sway your hips and trunk so they move your right / left arm without any use of your right / left shoulder muscles.  Progress your movements so that your right / left arm moves side to side, then forward and backward, and finally, both clockwise and counterclockwise.  Complete __________ repetitions in each direction. Many people use this exercise to relieve discomfort in their shoulder as well as to gain range of motion. Repeat __________ times. Complete  this exercise __________ times per day. STRETCH - Flexion, Seated   Sit in a firm chair so that your right / left forearm can rest on a table or on a table or countertop. Your right / left elbow should rest below the height of your shoulder so that your shoulder feels supported and not tense or uncomfortable.  Keeping your right / left shoulder relaxed, lean forward at your waist, allowing your right / left hand to slide forward. Bend forward until you feel a moderate stretch in your shoulder, but before you feel an increase in your pain.  Hold __________ seconds. Slowly return to your starting position. Repeat __________ times. Complete this exercise __________ times per day.  STRETCH - Flexion, Standing  Stand with good posture. With an underhand grip on your right / left and an overhand grip on the opposite hand, grasp a broomstick or cane so that your hands are a little more than shoulder-width apart.  Keeping your right / left elbow straight and shoulder muscles relaxed, push the stick with your opposite hand to raise your right / left arm in front of your body and then overhead. Raise your arm until you feel a stretch in your right / left shoulder, but before you have increased shoulder pain.  Avoid shrugging your right / left shoulder as your arm rises by keeping your shoulder blade tucked down and toward your mid-back  spine. Hold __________ seconds.  Slowly return to the starting position. Repeat __________ times. Complete this exercise __________ times per day. STRETCH - Abduction, Supine  Stand with good posture. With an underhand grip on your right / left and an overhand grip on the opposite hand, grasp a broomstick or cane so that your hands are a little more than shoulder-width apart.  Keeping your right / left elbow straight and shoulder muscles relaxed, push the stick with your opposite hand to raise your right / left arm out to the side of your body and then overhead. Raise  your arm until you feel a stretch in your right / left shoulder, but before you have increased shoulder pain.  Avoid shrugging your right / left shoulder as your arm rises by keeping your shoulder blade tucked down and toward your mid-back spine. Hold __________ seconds.  Slowly return to the starting position. Repeat __________ times. Complete this exercise __________ times per day. ROM - Flexion, Active-Assisted  Lie on your back. You may bend your knees for comfort.  Grasp a broomstick or cane so your hands are about shoulder-width apart. Your right / left hand should grip the end of the stick/cane so that your hand is positioned "thumbs-up," as if you were about to shake hands.  Using your healthy arm to lead, raise your right / left arm overhead until you feel a gentle stretch in your shoulder. Hold __________ seconds.  Use the stick/cane to assist in returning your right / left arm to its starting position. Repeat __________ times. Complete this exercise __________ times per day.  STRENGTHENING EXERCISES - Scapular Winging (Serratus Anterior Palsy, Long Thoracic Nerve Injury) These exercises may help you when beginning to rehabilitate your injury. They may resolve your symptoms with or without further involvement from your physician, physical therapist or athletic trainer. While completing these exercises, remember:   Muscles can gain both the endurance and the strength needed for everyday activities through controlled exercises.  Complete these exercises as instructed by your physician, physical therapist or athletic trainer. Progress with the resistance and repetition exercises only as your caregiver advises.  You may experience muscle soreness or fatigue, but the pain or discomfort you are trying to eliminate should never worsen during these exercises. If this pain does worsen, stop and make certain you are following the directions exactly. If the pain is still present after  adjustments, discontinue the exercise until you can discuss the trouble with your clinician.  During your recovery, avoid activity or exercises which involve actions that place your injured hand or elbow above your head or behind your back or head. These positions stress the tissues which are trying to heal. STRENGTH - Scapular Depression and Adduction   With good posture, sit on a firm chair. Supported your arms in front of you with pillows, arm rests or a table top. Have your elbows in line with the sides of your body.  Gently draw your shoulder blades down and toward your mid-back spine. Gradually increase the tension without tensing the muscles along the top of your shoulders and the back of your neck.  Hold for __________ seconds. Slowly release the tension and relax your muscles completely before completing the next repetition.  After you have practiced this exercise, remove the arm support and complete it in standing as well as sitting. Repeat __________ times. Complete this exercise __________ times per day.  STRENGTH - Scapular Protractors, Standing   Stand arms-length away from a wall. Place  your hands on the wall, keeping your elbows straight.  Begin by dropping your shoulder blades down and toward your mid-back spine.  To strengthen your protractors, keep your shoulder blades down, but slide them forward on your rib cage. It will feel as if you are lifting the back of your rib cage away from the wall. This is a subtle motion and can be challenging to complete. Ask your clinician for further instruction if you are not sure you are doing the exercise correctly.  Hold for __________ seconds. Slowly return to the starting position, resting the muscles completely before completing the next repetition. Repeat __________ times. Complete this exercise __________ times per day. STRENGTH - Scapular Protractors, Supine  Lie on your back on a firm surface. Extend your right / left arm  straight into the air while holding a __________ weight in your hand.  Keeping your head and back in place, lift your shoulder off the floor.  Hold __________ seconds. Slowly return to the starting position and allow your muscles to relax completely before completing the next repetition. Repeat __________ times. Complete this exercise __________ times per day. STRENGTH - Scapular Protractors, Quadruped  Get onto your hands and knees with your shoulders directly over your hands (or as close as you comfortably can be).  Keeping your elbows locked, lift the back of your rib cage up into your shoulder blades so your mid-back rounds-out. Keep your neck muscles relaxed.  Hold this position for __________ seconds. Slowly return to the starting position and allow your muscles to relax completely before completing the next repetition. Repeat __________ times. Complete this exercise __________ times per day.  STRENGTH - Scapular Depressors  Keeping your feet on the floor, lift your bottom from the seat and lock your elbows.  Keeping your elbows straight, allow gravity to pull your body weight down. Your shoulders will rise toward your ears.  Raise your body against gravity by drawing your shoulder blades down your back, shortening the distance between your shoulders and ears. Although your feet should always maintain contact with the floor, your feet should progressively support less body weight as you get stronger.  Hold __________ seconds. In a controlled and slow manner, lower your body weight to begin the next repetition. Repeat __________ times. Complete this exercise __________ times per day.  STRENGTH - Shoulder Extensors, Prone  Lie on your stomach on a firm surface so that your right / left arm overhangs the edge. Rest your forehead on your opposite forearm. With your thumb facing away from your body and your elbow straight, hold a __________ weight in your hand.  Squeeze your right / left  shoulder blade to your mid-back spine and then slowly raise your arm behind you to the height of the bed.  Hold for __________ seconds. Slowly reverse the directions and return to the starting position, controlling the weight as you lower your arm. Repeat __________ times. Complete this exercise __________ times per day.  STRENGTH - Horizontal Abductors Choose one of the two oppositions to complete this exercise. Prone: lying on stomach:  Lie on your stomach on a firm surface so that your right / left arm overhangs the edge. Rest your forehead on your opposite forearm. With your palm facing the floor and your elbow straight, hold a __________ weight in your hand.  Squeeze your right / left shoulder blade to your mid-back spine and then slowly raise your arm to the height of the bed.  Hold for __________ seconds.  Slowly reverse the directions and return to the starting position, controlling the weight as you lower your arm. Repeat __________ times. Complete this exercise __________ times per day. Standing:  Secure a rubber exercise band/tubing so that it is at the height of your shoulders when you are either standing or sitting on a firm arm-less chair.  Grasp an end of the band/tubing in each hand and have your palms face each other. Straighten your elbows and lift your hands straight in front of you at shoulder height. Step back away from the secured end of band/tubing until it becomes tense.  Squeeze your shoulder blades together. Keeping your elbows locked and your hands at shoulder-height, bring your hands out to your side.  Hold __________ seconds. Slowly ease the tension on the band/tubing as you reverse the directions and return to the starting position. Repeat __________ times. Complete this exercise __________ times per day. STRENGTH - Scapular Retractors  Secure a rubber exercise band/tubing so that it is at the height of your shoulders when you are either standing or sitting on a  firm arm-less chair.  With a palm-down grip, grasp an end of the band/tubing in each hand. Straighten your elbows and lift your hands straight in front of you at shoulder height. Step back away from the secured end of band/tubing until it becomes tense.  Squeezing your shoulder blades together, draw your elbows back as you bend them. Keep your upper arm lifted away from your body throughout the exercise.  Hold __________ seconds. Slowly ease the tension on the band/tubing as you reverse the directions and return to the starting position. Repeat __________ times. Complete this exercise __________ times per day. STRENGTH - Shoulder Extensors   Secure a rubber exercise band/tubing so that it is at the height of your shoulders when you are either standing or sitting on a firm arm-less chair.  With a thumbs-up grip, grasp an end of the band/tubing in each hand. Straighten your elbows and lift your hands straight in front of you at shoulder height. Step back away from the secured end of band/tubing until it becomes tense.  Squeezing your shoulder blades together, pull your hands down to the sides of your thighs. Do not allow your hands to go behind you.  Hold for __________ seconds. Slowly ease the tension on the band/tubing as you reverse the directions and return to the starting position. Repeat __________ times. Complete this exercise __________ times per day.  STRENGTH - Scapular Retractors and External Rotators  Secure a rubber exercise band/tubing so that it is at the height of your shoulders when you are either standing or sitting on a firm arm-less chair.  With a palm-down grip, grasp an end of the band/tubing in each hand. Bend your elbows 90 degrees and lift your elbows to shoulder height at your sides. Step back away from the secured end of band/tubing until it becomes tense.  Squeezing your shoulder blades together, rotate your shoulder so that your upper arm and elbow remain  stationary, but your fists travel upward to head-height.  Hold __________ for seconds. Slowly ease the tension on the band/tubing as you reverse the directions and return to the starting position. Repeat __________ times. Complete this exercise __________ times per day.  STRENGTH - Scapular Retractors and External Rotators, Rowing  Secure a rubber exercise band/tubing so that it is at the height of your shoulders when you are either standing or sitting on a firm arm-less chair.  With a palm-down  grip, grasp an end of the band/tubing in each hand. Straighten your elbows and lift your hands straight in front of you at shoulder height. Step back away from the secured end of band/tubing until it becomes tense.  Step 1: Squeeze your shoulder blades together. Bending your elbows, draw your hands to your chest as if you are rowing a boat. At the end of this motion, your hands and elbow should be at shoulder-height and your elbows should be out to your sides.  Step 2: Rotate your shoulder to raise your hands above your head. Your forearms should be vertical and your upper-arms should be horizontal.  Hold for __________ seconds. Slowly ease the tension on the band/tubing as you reverse the directions and return to the starting position. Repeat __________ times. Complete this exercise __________ times per day.  STRENGTH - Scapular Retractors and Elevators  Secure a rubber exercise band/tubing so that it is at the height of your shoulders when you are either standing or sitting on a firm arm-less chair.  With a thumbs-up grip, grasp an end of the band/tubing in each hand. Step back away from the secured end of band/tubing until it becomes tense.  Squeezing your shoulder blades together, straighten your elbows and lift your hands straight over your head.  Hold for __________ seconds. Slowly ease the tension on the band/tubing as you reverse the directions and return to the starting position. Repeat  __________ times. Complete this exercise __________ times per day.  Document Released: 02/17/2005 Document Revised: 05/12/2011 Document Reviewed: 06/01/2008 [facility] Patient Information 2015 [place], [place]. This information is not intended to replace advice given to you by your health care provider. Make sure you discuss any questions you have with your health care provider.

## 2013-12-02 NOTE — Assessment & Plan Note (Signed)
Decision today to treat with OMT was based on Physical Exam  After verbal consent patient was treated with HVLA, ME techniques in cervical, thoracic, lumbar and sacral areas  Patient tolerated the procedure well with improvement in symptoms  Patient given exercises, stretches and lifestyle modifications  See medications in patient instructions if given  Patient will follow up in 3-4 weeks  

## 2013-12-02 NOTE — Assessment & Plan Note (Signed)
Patient overall is doing well. Patient has not tried any foot illumination and we discussed other foot second possibly cosmos. We also discussed over-the-counter medications him to avoid chronic anti-inflammatory use. Patient will try some vitamin D supplementation which I think could be beneficial. We discussed watching her diet and see if anything seems to be any triggers. Patient has R. any change different other activities in her life but we also discussed her innominate to work that could be beneficial. Patient will try these interventions and come back again in 3 weeks for further evaluation and treatment.

## 2013-12-02 NOTE — Progress Notes (Addendum)
Tawana ScaleZach Jemiah Ellenburg D.O. [facility] [address] [place], [place] [address] Phone: [phone]([phone] Subjective:    I'm seeing this patient by the request  of:  Everlena CooperJaffe, MD  CC: Headache  UXL:[id number]:Subjective Valerie MastersShannon L Ruiz is a 38 y.o. female coming in with complaint of headache. Patient has had headaches for quite some time. Patient has been seen neurology. Patient was diagnosed more of a tension-type headaches. Patient has tried a Land[facility] in the past but only one time secondary to cost. Patient continues to take over-the-counter Advil sometimes a Flexeril tab when having significant pain. Patient was put on preventative therapy including Topamax but secondary to the side effects she was unable to tolerate it. Patient states that she has headaches that seem to be a dull nagging sensation usually every other day. 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 Patient was the intensity it is 4/10. Patient denies any associated symptoms and does not know of any significant triggers. Patient states it seems to be worse when she wakes up in the morning and then can get better through the day but been worse again at night. Patient states that she still able to do activities of daily living and feels that she rest comfortably at night.     Past medical history, social, surgical and family history all reviewed in electronic medical record.   Review of Systems: No headache, visual changes, nausea, vomiting, diarrhea, constipation, dizziness, abdominal pain, skin rash, fevers, chills, night sweats, weight loss, swollen lymph nodes, body aches, joint swelling, muscle aches, chest pain, shortness of breath, mood changes.   Objective Blood pressure 128/78, height 5\' 2"  (1.575 m), weight 128 lb (58.06 kg), last menstrual period 11/26/2013.  General: No apparent distress alert and oriented x3 mood and affect normal, dressed appropriately.  HEENT: Pupils equal, extraocular movements intact    Respiratory: Patient's speak in full sentences and does not appear short of breath  Cardiovascular: No lower extremity edema, non tender, no erythema  Skin: Warm dry intact with no signs of infection or rash on extremities or on axial skeleton.  Abdomen: Soft nontender  Neuro: Cranial nerves II through XII are intact, neurovascularly intact in all extremities with 2+ DTRs and 2+ pulses.  Lymph: No lymphadenopathy of posterior or anterior cervical chain or axillae bilaterally.  Gait normal with good balance and coordination.  MSK:  Non tender with full range of motion and good stability and symmetric strength and tone of shoulders, elbows, wrist, hip, knee and ankles bilaterally.  Neck: Inspection shows mild increase in lordosis at C5-C6 No palpable stepoffs. Negative Spurling's maneuver. Patient actually does lack the last 10 of flexion as well as the last 5 of rotation bilaterally. Grip strength and sensation normal in bilateral hands Strength good C4 to T1 distribution No sensory change to C4 to T1 Negative Hoffman sign bilaterally Reflexes normal  OMT Physical Exam  Standing structural       Occiput left higher  Shoulder left higher  Inferior angle of scapula left higher    Standing flexion right  Seated Flexion right  Cervical  C2 flexed rotated and side bent left C4 flexed rotated and side bent right  Thoracic T3 extended rotated and side bent to right T5 extended rotated and side bent left  Lumbar L2 flexed rotated and side bent left  Sacrum Left on left Illium      Impression and Recommendations:     This case required medical decision making of moderate complexity.

## 2013-12-02 NOTE — Assessment & Plan Note (Signed)
Patient does have some scapular dyskinesia that is likely given her some of her difficulty. Patient does have weak upper back compared to her lower back which this muscle imbalance does put more stress on her neck. We discussed postural training exercises and was given exercises for her scapular strength as well. We discussed an icing regimen and over-the-counter medications that could be beneficial. Patient did respond very well to osteopathic manipulation as well. Patient will try these interventions and come back again in 3 weeks for further evaluation and treatment.

## 2013-12-28 ENCOUNTER — Ambulatory Visit (INDEPENDENT_AMBULATORY_CARE_PROVIDER_SITE_OTHER): Payer: BC Managed Care – PPO | Admitting: Family Medicine

## 2013-12-28 ENCOUNTER — Encounter: Payer: Self-pay | Admitting: Family Medicine

## 2013-12-28 VITALS — BP 108/78 | HR 76 | Ht 62.0 in | Wt 126.0 lb

## 2013-12-28 DIAGNOSIS — M999 Biomechanical lesion, unspecified: Secondary | ICD-10-CM

## 2013-12-28 DIAGNOSIS — M9901 Segmental and somatic dysfunction of cervical region: Secondary | ICD-10-CM

## 2013-12-28 DIAGNOSIS — R51 Headache: Secondary | ICD-10-CM

## 2013-12-28 DIAGNOSIS — M9902 Segmental and somatic dysfunction of thoracic region: Secondary | ICD-10-CM

## 2013-12-28 DIAGNOSIS — G4486 Cervicogenic headache: Secondary | ICD-10-CM

## 2013-12-28 DIAGNOSIS — M9903 Segmental and somatic dysfunction of lumbar region: Secondary | ICD-10-CM

## 2013-12-28 NOTE — Progress Notes (Signed)
  Tawana ScaleZach Smith D.O. [facility] [address] [place], [place] [address] Phone: [phone] Subjective:    CC: Headache follow-up  BJY:NWGNFAOZHYHPI:Subjective Valerie Ruiz is a 38 y.o. female coming in with complaint of headache. Patient was seen previously for a cervicogenic headache. Patient is being followed by neurology. Patient did have manipulation previously 3 weeks ago and states that she is doing significantly better. Patient states that she has had only some mild headaches overall. Patient has been doing exercises for the neck as well as a scapular dyskinesia which is made significant improvement. Patient states that overall she feels proximally 80% better. Denies any new symptoms. Is resting comfortably at night.     Past medical history, social, surgical and family history all reviewed in electronic medical record.   Review of Systems: No headache, visual changes, nausea, vomiting, diarrhea, constipation, dizziness, abdominal pain, skin rash, fevers, chills, night sweats, weight loss, swollen lymph nodes, body aches, joint swelling, muscle aches, chest pain, shortness of breath, mood changes.   Objective Blood pressure 108/78, pulse 76, height 5\' 2"  (1.575 m), weight 126 lb (57.153 kg), last menstrual period 11/26/2013, SpO2 99.00%.  General: No apparent distress alert and oriented x3 mood and affect normal, dressed appropriately.  HEENT: Pupils equal, extraocular movements intact  Respiratory: Patient's speak in full sentences and does not appear short of breath  Cardiovascular: No lower extremity edema, non tender, no erythema  Skin: Warm dry intact with no signs of infection or rash on extremities or on axial skeleton.  Abdomen: Soft nontender  Neuro: Cranial nerves II through XII are intact, neurovascularly intact in all extremities with 2+ DTRs and 2+ pulses.  Lymph: No lymphadenopathy of posterior or anterior cervical chain or axillae bilaterally.  Gait normal with  good balance and coordination.  MSK:  Non tender with full range of motion and good stability and symmetric strength and tone of shoulders, elbows, wrist, hip, knee and ankles bilaterally. Patient does have hyper mobility of the hands and wrists bilaterally   Neck: Inspection shows mild increase in lordosis at C5-C6 No palpable stepoffs. Negative Spurling's maneuver. Full range of motion which is improved from previous exam Grip strength and sensation normal in bilateral hands Strength good C4 to T1 distribution No sensory change to C4 to T1 Negative Hoffman sign bilaterally Reflexes normal  OMT Physical Exam  Standing structural       Occiput left higher     Standing flexion right  Seated Flexion right  Cervical  C2 flexed rotated and side bent left C4 flexed rotated and side bent right  Thoracic T1 extended rotated and side bent left elevated first rib T3 extended rotated and side bent to right T5 extended rotated and side bent left  Lumbar L2 flexed rotated and side bent left  Sacrum Neutral Illium      Impression and Recommendations:     This case required medical decision making of moderate complexity.

## 2013-12-28 NOTE — Patient Instructions (Signed)
Great to see you You are doing great I would not change a thing.  Continue the vitamins  Lets space out to 5 weeks for next time.

## 2013-12-28 NOTE — Assessment & Plan Note (Signed)
Patient is doing remarkably well with over-the-counter natural supplementations as well as home exercises to improve patient's back strengthening. Patient also given range of motion for her neck. Patient is doing better even after one manipulative and we will space patient had to actually 5 week intervals. Encourage patient to continue to work on posture. Patient will come back and see me again in 5 weeks for further evaluation and treatment.

## 2013-12-28 NOTE — Assessment & Plan Note (Signed)
Decision today to treat with OMT was based on Physical Exam  After verbal consent patient was treated with HVLA, ME techniques in cervical, rib, thoracic, lumbar and sacral areas  Patient tolerated the procedure well with improvement in symptoms  Patient given exercises, stretches and lifestyle modifications  See medications in patient instructions if given  Patient will follow up in 5 weeks

## 2014-01-30 ENCOUNTER — Encounter: Payer: Self-pay | Admitting: Family Medicine

## 2014-01-30 ENCOUNTER — Ambulatory Visit (INDEPENDENT_AMBULATORY_CARE_PROVIDER_SITE_OTHER): Payer: BC Managed Care – PPO | Admitting: Family Medicine

## 2014-01-30 VITALS — BP 114/72 | HR 82 | Ht 62.0 in | Wt 125.0 lb

## 2014-01-30 DIAGNOSIS — M999 Biomechanical lesion, unspecified: Secondary | ICD-10-CM

## 2014-01-30 DIAGNOSIS — G4486 Cervicogenic headache: Secondary | ICD-10-CM

## 2014-01-30 DIAGNOSIS — M9901 Segmental and somatic dysfunction of cervical region: Secondary | ICD-10-CM

## 2014-01-30 DIAGNOSIS — M9902 Segmental and somatic dysfunction of thoracic region: Secondary | ICD-10-CM

## 2014-01-30 DIAGNOSIS — M533 Sacrococcygeal disorders, not elsewhere classified: Secondary | ICD-10-CM | POA: Insufficient documentation

## 2014-01-30 DIAGNOSIS — R51 Headache: Secondary | ICD-10-CM

## 2014-01-30 DIAGNOSIS — M9903 Segmental and somatic dysfunction of lumbar region: Secondary | ICD-10-CM

## 2014-01-30 DIAGNOSIS — M539 Dorsopathy, unspecified: Secondary | ICD-10-CM

## 2014-01-30 NOTE — Assessment & Plan Note (Signed)
Patient did have more lower back pain recently. Discussed with patient the weakness of hip abductors contribute in. Patient was given home exercises as well as different postural changes that can be beneficial. We will see patient again in 6 weeks for further evaluation.

## 2014-01-30 NOTE — Assessment & Plan Note (Signed)
Decision today to treat with OMT was based on Physical Exam  After verbal consent patient was treated with HVLA, ME techniques in cervical, rib, thoracic, lumbar and sacral areas  Patient tolerated the procedure well with improvement in symptoms  Patient given exercises, stretches and lifestyle modifications  See medications in patient instructions if given  Patient will follow up in 6 weeks

## 2014-01-30 NOTE — Progress Notes (Signed)
  Valerie Ruiz D.O. [facility] [address] [place], [place] [address] Phone: [phone]([phone] Subjective:    CC: Headache follow-up  BJY:NWGNFAOZHYHPI:Subjective Valerie MastersShannon L Ruiz is a 38 y.o. female coming in with complaint of headache. Patient was seen previously for a cervicogenic headache. Patient is being followed by neurology. Patient did have manipulation previously and responding well to conservative therapy.  Patient states that she is having headaches minimally. Patient states that they are almost nonexistent. Continue vitamins in the posterolateral exercises. Patient is very happy with the results. Denies needing any pain medications. Resting comfortable through the night.     Past medical history, social, surgical and family history all reviewed in electronic medical record.   Review of Systems: No headache, visual changes, nausea, vomiting, diarrhea, constipation, dizziness, abdominal pain, skin rash, fevers, chills, night sweats, weight loss, swollen lymph nodes, body aches, joint swelling, muscle aches, chest pain, shortness of breath, mood changes.   Objective Blood pressure 114/72, pulse 82, height 5\' 2"  (1.575 m), weight 125 lb (56.7 kg), SpO2 99 %.  General: No apparent distress alert and oriented x3 mood and affect normal, dressed appropriately.  HEENT: Pupils equal, extraocular movements intact  Respiratory: Patient's speak in full sentences and does not appear short of breath  Cardiovascular: No lower extremity edema, non tender, no erythema  Skin: Warm dry intact with no signs of infection or rash on extremities or on axial skeleton.  Abdomen: Soft nontender  Neuro: Cranial nerves II through XII are intact, neurovascularly intact in all extremities with 2+ DTRs and 2+ pulses.  Lymph: No lymphadenopathy of posterior or anterior cervical chain or axillae bilaterally.  Gait normal with good balance and coordination.  MSK:  Non tender with full range of motion and good  stability and symmetric strength and tone of shoulders, elbows, wrist, hip, knee and ankles bilaterally. Patient does have hyper mobility of the hands and wrists bilaterally   Neck: Inspection shows mild increase in lordosis at C5-C6 No palpable stepoffs. Negative Spurling's maneuver. Full range of motion which is improved from previous exam Grip strength and sensation normal in bilateral hands Strength good C4 to T1 distribution No sensory change to C4 to T1 Negative Hoffman sign bilaterally Reflexes normal  OMT Physical Exam  Standing structural       Occiput left higher     Standing flexion right  Seated Flexion right  Cervical  C2 flexed rotated and side bent left C4 flexed rotated and side bent right  Thoracic T1 extended rotated and side bent left elevated first rib T3 extended rotated and side bent to right T5 extended rotated and side bent left  Lumbar L2 flexed rotated and side bent left  Sacrum Left on left Illium      Impression and Recommendations:     This case required medical decision making of moderate complexity.

## 2014-01-30 NOTE — Patient Instructions (Addendum)
Great to see you Happy holidays  You are doing great.  Exercises on wall.  Heel and butt touching.  Raise leg 6 inches and hold 2 seconds.  Down slow for count of 4 seconds.  1 set of 30 reps daily on both sides.  Continue everything else you are doing .  Lets try 6 weeks.

## 2014-01-30 NOTE — Assessment & Plan Note (Signed)
Patient is responding very well to conservative therapy. We discussed icing regimen as well as the over-the-counter medications. We discussed avoiding other triggers. Patient continues to respond to osteopathic manipulation and we'll see her on a regular visit. We will extend visits to 6 week intervals.

## 2014-02-28 ENCOUNTER — Encounter: Payer: Self-pay | Admitting: Neurology

## 2014-02-28 ENCOUNTER — Ambulatory Visit (INDEPENDENT_AMBULATORY_CARE_PROVIDER_SITE_OTHER): Payer: BC Managed Care – PPO | Admitting: Neurology

## 2014-02-28 VITALS — BP 116/60 | HR 66 | Temp 98.5°F | Resp 16 | Ht 62.0 in | Wt 128.5 lb

## 2014-02-28 DIAGNOSIS — R51 Headache: Secondary | ICD-10-CM

## 2014-02-28 DIAGNOSIS — G4486 Cervicogenic headache: Secondary | ICD-10-CM

## 2014-02-28 NOTE — Progress Notes (Signed)
NEUROLOGY FOLLOW UP OFFICE NOTE  Valerie MastersShannon L Ruiz [id number]  HISTORY OF PRESENT ILLNESS: Valerie FloroShannon Ruiz is a 38 year old right-handed woman with history of asthma, palpitations, gastroenteritis and chronic headaches who follows up for tension-type headaches.  Records reviewed.  UPDATE: She was referred to Dr. Antoine PrimasZachary Smith for OMM.  She has been doing much better.  Dr. Katrinka BlazingSmith taught her exercises to perform at home. Intensity:  3/10 Duration: one day Frequency:  1-2x/month  Current abortive therapy:  Advil, cyclobenzaprine 5mg  (if severe, but effective) Current preventative therapy:  None.  Stopped topamax 25mg  after 3 weeks because it gave her a different type of headache.  Caffeine:  16 oz coffee daily Alcohol:  no Smoker:  no Diet:  good Exercise:  good Depression/stress:  good Sleep hygiene:  good  HISTORY: Onset:  Off and on for several years.  This episode started about 4 weeks ago. Location:  Suboccipital region.  Initially a tender focal spot on the right side and radiated to front.  Now bi-suboccipital and non-radiating.  Some neck tightness.  No pain or numbness involving the arms or hands. Quality:  Dull, nagging Initial intensity:  4-5/10; September 3-4/10 Aura:  no Prodrome:  no Associated symptoms:  none Initial furation:  Throughout the day (may wake up with it or it can start later in the day); September a day Initial frequency:  daily; September 20 headache days/month Triggers/exacerbating factors:  Laying supine with head on pillow.  Not triggered by neck movement or change in position. Relieving factors:  none Activity:  Able to function  Past abortive therapy:  Cyclobenzaprine 5mg  (helpful), Robaxin 500mg  (not effective) Past preventative therapy:  Amitriptyline 10mg  daily (palpitations)  Family history of headache:  Mom (migraines)  She does have history of typical migraines associated with nausea, which are different than this  headache  Past history:  She [facility] Terr[facility]an [facility] in June 2013 for acute onset left hand and foot numbness.  Exam was normal and imaging was unremarkable.  08/21/11 MRI of the brain wo contrast: essentially normal with incidental left frontal parasagittal focus of gliosis of unclear significance.  PAST MEDICAL HISTORY: Past Medical History  Diagnosis Date  . Allergic rhinitis   . Asthma   . Chronic headaches     MEDICATIONS: Current Outpatient Prescriptions on File Prior to Visit  Medication Sig Dispense Refill  . metoprolol tartrate (LOPRESSOR) 25 MG tablet TAKE 1/2 TABLETS (12.5 MG TOTAL) BY MOUTH 2 (TWO) TIMES DAILY. 60 tablet 0   No current facility-administered medications on file prior to visit.    ALLERGIES: No Known Allergies  FAMILY HISTORY: Family History  Problem Relation Age of Onset  . Hyperthyroidism Mother   . Breast cancer Paternal Grandmother     SOCIAL HISTORY: History   Social History  . Marital Status: Single    Spouse Name: N/A    Number of Children: N/A  . Years of Education: N/A   Occupational History  . Not on file.   Social History Main Topics  . Smoking status: Never Smoker   . Smokeless tobacco: Not on file  . Alcohol Use: 1.5 oz/week    3 drink(s) per week  . Drug Use: No  . Sexual Activity:    Partners: Female   Other Topics Concern  . Not on file   Social History Narrative    REVIEW OF SYSTEMS: Constitutional: No fevers, chills, or sweats, no generalized fatigue, change in appetite  Eyes: No visual changes, double vision, eye pain Ear, nose and throat: No hearing loss, ear pain, nasal congestion, sore throat Cardiovascular: No chest pain, palpitations Respiratory:  No shortness of breath at rest or with exertion, wheezes GastrointestinaI: No nausea, vomiting, diarrhea, abdominal pain, fecal incontinence Genitourinary:  No dysuria, urinary retention or frequency Musculoskeletal:  No  neck pain, back pain Integumentary: No rash, pruritus, skin lesions Neurological: as above Psychiatric: No depression, insomnia, anxiety Endocrine: No palpitations, fatigue, diaphoresis, mood swings, change in appetite, change in weight, increased thirst Hematologic/Lymphatic:  No anemia, purpura, petechiae. Allergic/Immunologic: no itchy/runny eyes, nasal congestion, recent allergic reactions, rashes  PHYSICAL EXAM: Filed Vitals:   02/28/14 0857  BP: 116/60  Pulse: 66  Temp: 98.5 F (36.9 C)  Resp: 16   General: No acute distress Head:  Normocephalic/atraumatic Eyes:  Fundoscopic exam unremarkable without vessel changes, exudates, hemorrhages or papilledema. Neck: supple, no paraspinal tenderness, full range of motion Heart:  Regular rate and rhythm Lungs:  Clear to auscultation bilaterally Back: No paraspinal tenderness Neurological Exam: alert and oriented to person, place, and time. Attention span and concentration intact, recent and remote memory intact, fund of knowledge intact.  Speech fluent and not dysarthric, language intact.  CN II-XII intact. Fundoscopic exam unremarkable without vessel changes, exudates, hemorrhages or papilledema.  Bulk and tone normal, muscle strength 5/5 throughout.  Sensation to light touch intact.  Deep tendon reflexes 2+ throughout.  Finger to nosetesting intact.  Gait normal  IMPRESSION: Cervicogenic headache, improved  PLAN: 1.  Continue exercises as per Dr. Katrinka BlazingSmith 2.  Follow up in 6 months.  15 minutes spent with patient, over 50% spent discussing management.  Shon MilletAdam Jaffe, DO  CC: Valerie Haloe-Hyun Yoo, MD

## 2014-02-28 NOTE — Patient Instructions (Signed)
Continue exercises as per Dr Katrinka BlazingSmith Follow up in 6 months.

## 2014-03-17 ENCOUNTER — Encounter: Payer: Self-pay | Admitting: Family Medicine

## 2014-03-17 ENCOUNTER — Ambulatory Visit (INDEPENDENT_AMBULATORY_CARE_PROVIDER_SITE_OTHER): Payer: BC Managed Care – PPO | Admitting: Family Medicine

## 2014-03-17 VITALS — BP 114/74 | HR 74 | Ht 62.0 in | Wt 125.0 lb

## 2014-03-17 DIAGNOSIS — G4486 Cervicogenic headache: Secondary | ICD-10-CM

## 2014-03-17 DIAGNOSIS — M9901 Segmental and somatic dysfunction of cervical region: Secondary | ICD-10-CM

## 2014-03-17 DIAGNOSIS — M9903 Segmental and somatic dysfunction of lumbar region: Secondary | ICD-10-CM

## 2014-03-17 DIAGNOSIS — R51 Headache: Secondary | ICD-10-CM

## 2014-03-17 DIAGNOSIS — M999 Biomechanical lesion, unspecified: Secondary | ICD-10-CM

## 2014-03-17 DIAGNOSIS — M9902 Segmental and somatic dysfunction of thoracic region: Secondary | ICD-10-CM

## 2014-03-17 NOTE — Patient Instructions (Addendum)
So good to see you You are doing great overall.  Continue what you are doing  Making my job easy.  Continue the vitamins  See me in 6 weeks.

## 2014-03-17 NOTE — Assessment & Plan Note (Signed)
Decision today to treat with OMT was based on Physical Exam  After verbal consent patient was treated with HVLA, ME techniques in cervical, rib, thoracic, lumbar and sacral areas  Patient tolerated the procedure well with improvement in symptoms  Patient given exercises, stretches and lifestyle modifications  See medications in patient instructions if given  Patient will follow up in 6-8 weeks

## 2014-03-17 NOTE — Progress Notes (Signed)
  Tawana ScaleZach Smith D.O. [facility] [address] [place], [place] [address] Phone: [phone](336) [phone] Subjective:    CC: Headache follow-up  BJY:NWGNFAOZHYHPI:Subjective Valerie MastersShannon L Ruiz is a 39 y.o. female coming in with complaint of headache. Patient was seen previously for a cervicogenic headache. Patient will longer is being followed by neurology. Patient is doing very well with the natural supplementations and continues to try to do the exercises on a regular basis. Patient states that she has been doing very well and has had only 2 headaches or pain since previous exam. Patient is very happy with the results. Patient has noticed some mild increasing tightness over the course last week.    Past medical history, social, surgical and family history all reviewed in electronic medical record.   Review of Systems: No headache, visual changes, nausea, vomiting, diarrhea, constipation, dizziness, abdominal pain, skin rash, fevers, chills, night sweats, weight loss, swollen lymph nodes, body aches, joint swelling, muscle aches, chest pain, shortness of breath, mood changes.   Objective Blood pressure 114/74, pulse 74, height 5\' 2"  (1.575 m), weight 125 lb (56.7 kg), last menstrual period 02/25/2014, SpO2 98 %.  General: No apparent distress alert and oriented x3 mood and affect normal, dressed appropriately.  HEENT: Pupils equal, extraocular movements intact  Respiratory: Patient's speak in full sentences and does not appear short of breath  Cardiovascular: No lower extremity edema, non tender, no erythema  Skin: Warm dry intact with no signs of infection or rash on extremities or on axial skeleton.  Abdomen: Soft nontender  Neuro: Cranial nerves II through XII are intact, neurovascularly intact in all extremities with 2+ DTRs and 2+ pulses.  Lymph: No lymphadenopathy of posterior or anterior cervical chain or axillae bilaterally.  Gait normal with good balance and coordination.  MSK:  Non tender with  full range of motion and good stability and symmetric strength and tone of shoulders, elbows, wrist, hip, knee and ankles bilaterally. Patient does have hyper mobility of the hands and wrists bilaterally   Neck: Inspection shows mild increase in lordosis at C5-C6 No palpable stepoffs. Negative Spurling's maneuver. Full range of motion  Grip strength and sensation normal in bilateral hands Strength good C4 to T1 distribution No sensory change to C4 to T1 Negative Hoffman sign bilaterally Reflexes normal  OMT Physical Exam  Standing structural       Occiput left higher     Standing flexion right  Seated Flexion right  Cervical  C2 flexed rotated and side bent left C4 flexed rotated and side bent right  Thoracic T1 extended rotated and side bent left elevated first rib T3 extended rotated and side bent to right T5 extended rotated and side bent left  Lumbar L2 flexed rotated and side bent left  Sacrum Left on left Same pattern as previous exam      Impression and Recommendations:     This case required medical decision making of moderate complexity.

## 2014-03-17 NOTE — Assessment & Plan Note (Signed)
Patient continues to have some very mild headaches but the frequency duration and severity has decreased since patient has been seen me. Encourage her to continue the natural supplementations as well as the icing protocol. Patient will continue with the postural exercises. Patient has made ergonomic changes at work which I think has been very beneficial. Patient did very well with the 6 week intervals and we'll continue at that time for further evaluation.

## 2014-04-28 ENCOUNTER — Ambulatory Visit: Payer: BC Managed Care – PPO | Admitting: Family Medicine

## 2014-05-09 ENCOUNTER — Encounter: Payer: Self-pay | Admitting: Family Medicine

## 2014-05-09 ENCOUNTER — Ambulatory Visit (INDEPENDENT_AMBULATORY_CARE_PROVIDER_SITE_OTHER): Payer: BC Managed Care – PPO | Admitting: Family Medicine

## 2014-05-09 VITALS — BP 112/70 | HR 74 | Ht 62.0 in | Wt 126.0 lb

## 2014-05-09 DIAGNOSIS — G4486 Cervicogenic headache: Secondary | ICD-10-CM

## 2014-05-09 DIAGNOSIS — R51 Headache: Secondary | ICD-10-CM

## 2014-05-09 DIAGNOSIS — M9903 Segmental and somatic dysfunction of lumbar region: Secondary | ICD-10-CM

## 2014-05-09 DIAGNOSIS — M999 Biomechanical lesion, unspecified: Secondary | ICD-10-CM

## 2014-05-09 DIAGNOSIS — M9901 Segmental and somatic dysfunction of cervical region: Secondary | ICD-10-CM

## 2014-05-09 DIAGNOSIS — M9902 Segmental and somatic dysfunction of thoracic region: Secondary | ICD-10-CM

## 2014-05-09 NOTE — Patient Instructions (Signed)
Good to see you Ice is your friend  Bonita QuinYou are doing amazing Continue everything you are doing Work on sleep position.  See me again in 8 weeks.

## 2014-05-09 NOTE — Progress Notes (Signed)
Pre visit review using our clinic review tool, if applicable. No additional management support is needed unless otherwise documented below in the visit note. 

## 2014-05-09 NOTE — Assessment & Plan Note (Signed)
Patient is doing much better overall. We discussed icing and home exercises. We discussed continuing the postural changes. Patient is becoming more and more active which I think is beneficial to patient. Discussed continuing the natural supplementations because seems to be working very well. Patient may be able to start getting off of her prophylactic headache medication but she'll discuss this with her primary care provider. Patient then will come back and see me in 8-12 weeks for further evaluation and treatment.

## 2014-05-09 NOTE — Progress Notes (Signed)
  Tawana ScaleZach Smith D.O. [facility] [address] [place], [place] [address] Phone: [phone] Subjective:    CC: Headache follow-up  BJY:[id number]:Subjective Valerie MastersShannon L Ruiz is a 39 y.o. female coming in with complaint of headache. Patient was seen previously for a cervicogenic headache. Patient has been doing significantly well. We discussed icing and home exercises. Patient has been doing on a regular basis. Patient is no longer taking any anti-inflammatories and is only taking vitamin supplementations. Patient states that her headaches a very few and far between. Nothing that is debilitating. Patient is very happy with the results. Past medical history, social, surgical and family history all reviewed in electronic medical record.   Review of Systems: No headache, visual changes, nausea, vomiting, diarrhea, constipation, dizziness, abdominal pain, skin rash, fevers, chills, night sweats, weight loss, swollen lymph nodes, body aches, joint swelling, muscle aches, chest pain, shortness of breath, mood changes.   Objective Blood pressure 112/70, pulse 74, height 5\' 2"  (1.575 m), weight 126 lb (57.153 kg), SpO2 99 %.  General: No apparent distress alert and oriented x3 mood and affect normal, dressed appropriately.  HEENT: Pupils equal, extraocular movements intact  Respiratory: Patient's speak in full sentences and does not appear short of breath  Cardiovascular: No lower extremity edema, non tender, no erythema  Skin: Warm dry intact with no signs of infection or rash on extremities or on axial skeleton.  Abdomen: Soft nontender  Neuro: Cranial nerves II through XII are intact, neurovascularly intact in all extremities with 2+ DTRs and 2+ pulses.  Lymph: No lymphadenopathy of posterior or anterior cervical chain or axillae bilaterally.  Gait normal with good balance and coordination.  MSK:  Non tender with full range of motion and good stability and symmetric strength and tone of  shoulders, elbows, wrist, hip, knee and ankles bilaterally. Patient does have hyper mobility of the hands and wrists bilaterally   Neck: Inspection shows mild increase in lordosis at C5-C6 No palpable stepoffs. Negative Spurling's maneuver. Full range of motion  Grip strength and sensation normal in bilateral hands Strength good C4 to T1 distribution No sensory change to C4 to T1 Negative Hoffman sign bilaterally Reflexes normal No change from previous exam  OMT Physical Exam  Standing structural       Occiput left higher    Cervical  C2 flexed rotated and side bent left C4 flexed rotated and side bent right  Thoracic T1 extended rotated and side bent left elevated first rib T3 extended rotated and side bent to right T5 extended rotated and side bent left  Lumbar L2 flexed rotated and side bent left  Sacrum Left on left Same pattern as previous exam      Impression and Recommendations:     This case required medical decision making of moderate complexity.

## 2014-05-09 NOTE — Assessment & Plan Note (Signed)
Decision today to treat with OMT was based on Physical Exam  After verbal consent patient was treated with HVLA, ME techniques in cervical, rib, thoracic, lumbar and sacral areas  Patient tolerated the procedure well with improvement in symptoms  Patient given exercises, stretches and lifestyle modifications  See medications in patient instructions if given  Patient will follow up in 8-12 weeks

## 2014-07-28 ENCOUNTER — Encounter: Payer: Self-pay | Admitting: Family Medicine

## 2014-07-28 ENCOUNTER — Ambulatory Visit (INDEPENDENT_AMBULATORY_CARE_PROVIDER_SITE_OTHER): Payer: BC Managed Care – PPO | Admitting: Family Medicine

## 2014-07-28 VITALS — BP 118/74 | HR 75 | Ht 62.0 in | Wt 127.0 lb

## 2014-07-28 DIAGNOSIS — M999 Biomechanical lesion, unspecified: Secondary | ICD-10-CM

## 2014-07-28 DIAGNOSIS — G4486 Cervicogenic headache: Secondary | ICD-10-CM

## 2014-07-28 DIAGNOSIS — M9902 Segmental and somatic dysfunction of thoracic region: Secondary | ICD-10-CM | POA: Diagnosis not present

## 2014-07-28 DIAGNOSIS — M9903 Segmental and somatic dysfunction of lumbar region: Secondary | ICD-10-CM

## 2014-07-28 DIAGNOSIS — R51 Headache: Secondary | ICD-10-CM

## 2014-07-28 DIAGNOSIS — M9901 Segmental and somatic dysfunction of cervical region: Secondary | ICD-10-CM | POA: Diagnosis not present

## 2014-07-28 NOTE — Progress Notes (Signed)
  Tawana ScaleZach Ruiz D.O. [facility] [address] [place], [place] [address] Phone: [phone] Subjective:    CC: Headache follow-up  MVH:QIONGEXBMWHPI:Subjective Valerie Ruiz is a 39 y.o. female coming in with complaint of headache. Patient was seen previously for a cervicogenic headache. Patient has been doing very well for the last 8 weeks. Patient states though that unfortunate she started having some mild worsening pain in the neck as well as giving her some headaches. This is very minimal. Continues to metoprolol on a regular basis. No significant new symptoms.   Past medical history, social, surgical and family history all reviewed in electronic medical record.   Review of Systems: No headache, visual changes, nausea, vomiting, diarrhea, constipation, dizziness, abdominal pain, skin rash, fevers, chills, night sweats, weight loss, swollen lymph nodes, body aches, joint swelling, muscle aches, chest pain, shortness of breath, mood changes.   Objective Blood pressure 118/74, pulse 75, height 5\' 2"  (1.575 m), weight 127 lb (57.607 kg), SpO2 98 %.  General: No apparent distress alert and oriented x3 mood and affect normal, dressed appropriately.  HEENT: Pupils equal, extraocular movements intact  Respiratory: Patient's speak in full sentences and does not appear short of breath  Cardiovascular: No lower extremity edema, non tender, no erythema  Skin: Warm dry intact with no signs of infection or rash on extremities or on axial skeleton.  Abdomen: Soft nontender  Neuro: Cranial nerves II through XII are intact, neurovascularly intact in all extremities with 2+ DTRs and 2+ pulses.  Lymph: No lymphadenopathy of posterior or anterior cervical chain or axillae bilaterally.  Gait normal with good balance and coordination.  MSK:  Non tender with full range of motion and good stability and symmetric strength and tone of shoulders, elbows, wrist, hip, knee and ankles bilaterally. Patient does  have hyper mobility of the hands and wrists bilaterally   Neck: Inspection shows mild increase in lordosis at C5-C6 No palpable stepoffs. Negative Spurling's maneuver. Full range of motion  Grip strength and sensation normal in bilateral hands Strength good C4 to T1 distribution No sensory change to C4 to T1 Negative Hoffman sign bilaterally Reflexes normal Skin shortness of the left trapezius muscle compared to previous exam.  OMT Physical Exam  Standing structural       Occiput left higher    Cervical  C2 flexed rotated and side bent left C4 flexed rotated and side bent right  Thoracic T1 extended rotated and side bent left elevated first rib more tightness of the trapezius as well. T3 extended rotated and side bent to right T5 extended rotated and side bent left  Lumbar L2 flexed rotated and side bent left  Sacrum Left on left Same pattern as previous exam      Impression and Recommendations:     This case required medical decision making of moderate complexity.

## 2014-07-28 NOTE — Progress Notes (Signed)
Pre visit review using our clinic review tool, if applicable. No additional management support is needed unless otherwise documented below in the visit note. 

## 2014-07-28 NOTE — Patient Instructions (Signed)
Great to see you Happy memorial day.  Ice is your friend.  Continue to work out and stay active Try the medicine for 3 days See me again in 4 weeks.

## 2014-07-28 NOTE — Assessment & Plan Note (Signed)
Patient is having a minor flare. Patient continues to respond very well to the manipulation. We discussed icing regimen and home exercises. We discussed with patient to do more of the postural chaining. Patient is doing well with her core strength. Patient come back and see me again in 4 weeks for further evaluation and treatment.

## 2014-07-28 NOTE — Assessment & Plan Note (Signed)
Decision today to treat with OMT was based on Physical Exam  After verbal consent patient was treated with HVLA, ME techniques in cervical, rib, thoracic, lumbar and sacral areas  Patient tolerated the procedure well with improvement in symptoms  Patient given exercises, stretches and lifestyle modifications  See medications in patient instructions if given  Patient will follow up in 4 weeks

## 2014-08-30 ENCOUNTER — Encounter: Payer: Self-pay | Admitting: Family Medicine

## 2014-08-30 ENCOUNTER — Ambulatory Visit (INDEPENDENT_AMBULATORY_CARE_PROVIDER_SITE_OTHER): Payer: BC Managed Care – PPO | Admitting: Neurology

## 2014-08-30 ENCOUNTER — Encounter: Payer: Self-pay | Admitting: Neurology

## 2014-08-30 ENCOUNTER — Ambulatory Visit (INDEPENDENT_AMBULATORY_CARE_PROVIDER_SITE_OTHER): Payer: BC Managed Care – PPO | Admitting: Family Medicine

## 2014-08-30 ENCOUNTER — Encounter: Payer: Self-pay | Admitting: *Deleted

## 2014-08-30 VITALS — BP 118/70 | HR 78 | Resp 16 | Ht 62.0 in | Wt 129.1 lb

## 2014-08-30 VITALS — BP 108/68 | HR 70 | Ht 62.0 in | Wt 127.0 lb

## 2014-08-30 DIAGNOSIS — M999 Biomechanical lesion, unspecified: Secondary | ICD-10-CM

## 2014-08-30 DIAGNOSIS — R51 Headache: Secondary | ICD-10-CM

## 2014-08-30 DIAGNOSIS — M9902 Segmental and somatic dysfunction of thoracic region: Secondary | ICD-10-CM | POA: Diagnosis not present

## 2014-08-30 DIAGNOSIS — M9903 Segmental and somatic dysfunction of lumbar region: Secondary | ICD-10-CM

## 2014-08-30 DIAGNOSIS — M9901 Segmental and somatic dysfunction of cervical region: Secondary | ICD-10-CM | POA: Diagnosis not present

## 2014-08-30 DIAGNOSIS — G4486 Cervicogenic headache: Secondary | ICD-10-CM

## 2014-08-30 MED ORDER — CYCLOBENZAPRINE HCL 10 MG PO TABS: ORAL_TABLET | ORAL | Status: DC

## 2014-08-30 NOTE — Patient Instructions (Signed)
You are doing good  Lets see if we can get you a standing desk Until then consider the tennis ball between shoulder blades Stay active Too easy see me again in 8-10 weeks

## 2014-08-30 NOTE — Assessment & Plan Note (Signed)
Decision today to treat with OMT was based on Physical Exam  After verbal consent patient was treated with HVLA, ME techniques in cervical, rib, thoracic, lumbar and sacral areas  Patient tolerated the procedure well with improvement in symptoms  Patient given exercises, stretches and lifestyle modifications  See medications in patient instructions if given  Patient will follow up in 8-10 weeks

## 2014-08-30 NOTE — Progress Notes (Signed)
NEUROLOGY FOLLOW UP OFFICE NOTE  Valerie Ruiz [id number]  HISTORY OF PRESENT ILLNESS: Valerie Ruiz is a 39 year old right-handed woman with history of asthma, palpitations, gastroenteritis and chronic headaches who follows up for tension-type headaches.  Records reviewed.  UPDATE: She has been seeing Dr. Katrinka BlazingSmith for OMM.  She has noted much improvement in headaches.  Headaches only occur about twice a month.  They are now only localized to the right suboccipital region and do not radiate.  They are manageable.  She will often take a Flexeril.  HISTORY: Onset:  Off and on for several years.  This episode started about 4 weeks ago. Location:  Suboccipital region.  Initially a tender focal spot on the right side and radiated to front.  Now bi-suboccipital and non-radiating.  Some neck tightness.  No pain or numbness involving the arms or hands. Quality:  Dull, nagging Initial intensity:  4-5/10; September 3-4/10 Aura:  no Prodrome:  no Associated symptoms:  none Initial furation:  Throughout the day (may wake up with it or it can start later in the day); September a day Initial frequency:  daily; September 20 headache days/month Triggers/exacerbating factors:  Laying supine with head on pillow.  Not triggered by neck movement or change in position. Relieving factors:  none Activity:  Able to function  Past abortive therapy:  Cyclobenzaprine 5mg  (helpful), Robaxin 500mg  (not effective) Past preventative therapy:  Amitriptyline 10mg  daily (palpitations), topiramate (headache)  Family history of headache:  Mom (migraines)  She does have history of typical migraines associated with nausea, which are different than this headache  Past history:  She [facility] June [facility] for acute onset left hand and foot numbness.  Exam was normal and imaging was unremarkable.  08/21/11 MRI of the brain wo contrast: essentially normal with  incidental left frontal parasagittal focus of gliosis of unclear significance.  PAST MEDICAL HISTORY: Past Medical History  Diagnosis Date  . Allergic rhinitis   . Asthma   . Chronic headaches     MEDICATIONS: Current Outpatient Prescriptions on File Prior to Visit  Medication Sig Dispense Refill  . metoprolol tartrate (LOPRESSOR) 25 MG tablet TAKE 1/2 TABLETS (12.5 MG TOTAL) BY MOUTH 2 (TWO) TIMES DAILY. 60 tablet 0   No current facility-administered medications on file prior to visit.    ALLERGIES: No Known Allergies  FAMILY HISTORY: Family History  Problem Relation Age of Onset  . Hyperthyroidism Mother   . Breast cancer Paternal Grandmother     SOCIAL HISTORY: History   Social History  . Marital Status: Single    Spouse Name: N/A  . Number of Children: N/A  . Years of Education: N/A   Occupational History  . Not on file.   Social History Main Topics  . Smoking status: Never Smoker   . Smokeless tobacco: Not on file  . Alcohol Use: 1.5 oz/week    3 drink(s) per week  . Drug Use: No  . Sexual Activity:    Partners: Female   Other Topics Concern  . Not on file   Social History Narrative    REVIEW OF SYSTEMS: Constitutional: No fevers, chills, or sweats, no generalized fatigue, change in appetite Eyes: No visual changes, double vision, eye pain Ear, nose and throat: No hearing loss, ear pain, nasal congestion, sore throat Cardiovascular: No chest pain, palpitations Respiratory:  No shortness of breath at rest or with exertion, wheezes GastrointestinaI: No nausea, vomiting, diarrhea,  abdominal pain, fecal incontinence Genitourinary:  No dysuria, urinary retention or frequency Musculoskeletal:  No neck pain, back pain Integumentary: No rash, pruritus, skin lesions Neurological: as above Psychiatric: No depression, insomnia, anxiety Endocrine: No palpitations, fatigue, diaphoresis, mood swings, change in appetite, change in weight, increased  thirst Hematologic/Lymphatic:  No anemia, purpura, petechiae. Allergic/Immunologic: no itchy/runny eyes, nasal congestion, recent allergic reactions, rashes  PHYSICAL EXAM: Filed Vitals:   08/30/14 0902  BP: 118/70  Pulse: 78  Resp: 16   General: No acute distress Head:  Normocephalic/atraumatic Eyes:  Fundoscopic exam unremarkable without vessel changes, exudates, hemorrhages or papilledema. Neck: supple, no paraspinal tenderness, full range of motion Heart:  Regular rate and rhythm Lungs:  Clear to auscultation bilaterally Back: No paraspinal tenderness Neurological Exam: alert and oriented to person, place, and time. Attention span and concentration intact, recent and remote memory intact, fund of knowledge intact.  Speech fluent and not dysarthric, language intact.  CN II-XII intact. Fundoscopic exam unremarkable without vessel changes, exudates, hemorrhages or papilledema.  Bulk and tone normal, muscle strength 5/5 throughout.  Sensation to light touch, temperature and vibration intact.  Deep tendon reflexes 2+ throughout, toes downgoing.  Finger to nose and heel to shin testing intact.  Gait normal, Romberg negative.  IMPRESSION: Cervicogenic headache  PLAN: Continue therapy with Dr. Katrinka Blazing Cyclobenzaprine  as needed for head/neck pain. Follow up in 6 months.  15 minutes spent face to face with patient, over 50% spent discussing management.   Shon Millet, DO  CC: Thomos Lemons, MD

## 2014-08-30 NOTE — Patient Instructions (Signed)
1.  Continue therapy with Dr. Katrinka BlazingSmith 2.  Take Flexeril as needed for the headache/neck pain 3.  Follow up in 6 months.

## 2014-08-30 NOTE — Progress Notes (Signed)
Pre visit review using our clinic review tool, if applicable. No additional management support is needed unless otherwise documented below in the visit note. 

## 2014-08-30 NOTE — Progress Notes (Signed)
  Tawana ScaleZach Smith D.O. [facility] [address] [place], [place] [address] Phone: [phone] Subjective:    CC: Headache follow-up   Lower back pain follow-up BJY:NWGNFAOZHYHPI:Subjective Valerie MastersShannon L Ruiz is a 39 y.o. female coming in with complaint of headache. Patient was seen previously for a cervicogenic headache.  Patient has been doing very well and has been on 8 weeks intervals.  This was found to have more of a lower back pain than headaches previously. Patient was not have more of a sacroiliac joint dysfunction. Patient states  For all she has been doing relatively well. States that low back is not giving her as much trouble. Patient is doing to classes of core strengthening exercises a week which has made a big difference. Patient still having some mild tightness of the upper right side of her back and neck but gives her some headaches but very intermittently. Patient states it seems to be triggered mostly at work.   Past medical history, social, surgical and family history all reviewed in electronic medical record.   Review of Systems: No headache, visual changes, nausea, vomiting, diarrhea, constipation, dizziness, abdominal pain, skin rash, fevers, chills, night sweats, weight loss, swollen lymph nodes, body aches, joint swelling, muscle aches, chest pain, shortness of breath, mood changes.   Objective Blood pressure 108/68, pulse 70, weight 127 lb (57.607 kg), last menstrual period 08/23/2014, SpO2 98 %.  General: No apparent distress alert and oriented x3 mood and affect normal, dressed appropriately.  HEENT: Pupils equal, extraocular movements intact  Respiratory: Patient's speak in full sentences and does not appear short of breath  Cardiovascular: No lower extremity edema, non tender, no erythema  Skin: Warm dry intact with no signs of infection or rash on extremities or on axial skeleton.  Abdomen: Soft nontender  Neuro: Cranial nerves II through XII are intact,  neurovascularly intact in all extremities with 2+ DTRs and 2+ pulses.  Lymph: No lymphadenopathy of posterior or anterior cervical chain or axillae bilaterally.  Gait normal with good balance and coordination.  MSK:  Non tender with full range of motion and good stability and symmetric strength and tone of shoulders, elbows, wrist, hip, knee and ankles bilaterally. Patient does have hyper mobility of the hands and wrists bilaterally   Neck:  improvement in the severity of the lordosis. No palpable stepoffs. Negative Spurling's maneuver. Full range of motion  Grip strength and sensation normal in bilateral hands Strength good C4 to T1 distribution No sensory change to C4 to T1 Negative Hoffman sign bilaterally Reflexes normal   OMT Physical Exam  Standing structural       Occiput left higher    Cervical  C2 flexed rotated and side bent left C4 flexed rotated and side bent right  Thoracic  left elevated first rib with T1 extended and rotated and side bent left T3 extended rotated and side bent to right T5 extended rotated and side bent left  Lumbar L2 flexed rotated and side bent left  Sacrum Left on left     Impression and Recommendations:     This case required medical decision making of moderate complexity.

## 2014-08-30 NOTE — Assessment & Plan Note (Signed)
Doing well, some is due to positioning at work Will try ergonomics and  Given note  For evaluation and work area. We discussed continuing the course strength and exercises as well as the postural training. Patient does have footdrop needed. Patient follow-up with me again in 8-10 weeks for further evaluation and treatment.

## 2014-09-09 ENCOUNTER — Ambulatory Visit (INDEPENDENT_AMBULATORY_CARE_PROVIDER_SITE_OTHER): Payer: BC Managed Care – PPO | Admitting: Family Medicine

## 2014-09-09 ENCOUNTER — Encounter: Payer: Self-pay | Admitting: Family Medicine

## 2014-09-09 VITALS — BP 120/70 | HR 80 | Temp 97.8°F | Ht 62.0 in | Wt 129.0 lb

## 2014-09-09 DIAGNOSIS — R1032 Left lower quadrant pain: Secondary | ICD-10-CM | POA: Diagnosis not present

## 2014-09-09 LAB — POCT URINALYSIS DIPSTICK
Bilirubin, UA: NEGATIVE
Blood, UA: NEGATIVE
GLUCOSE UA: NEGATIVE
Ketones, UA: 0.2
LEUKOCYTES UA: NEGATIVE
NITRITE UA: NEGATIVE
Protein, UA: NEGATIVE
Spec Grav, UA: 1.015
UROBILINOGEN UA: 0.2
pH, UA: 7.5

## 2014-09-09 LAB — POCT URINE PREGNANCY: PREG TEST UR: NEGATIVE

## 2014-09-09 NOTE — Progress Notes (Signed)
Pre visit review using our clinic review tool, if applicable. No additional management support is needed unless otherwise documented below in the visit note. 

## 2014-09-09 NOTE — Patient Instructions (Signed)
Pelvic Pain Female pelvic pain can be caused by many different things and start from a variety of places. Pelvic pain refers to pain that is located in the lower half of the abdomen and between your hips. The pain may occur over a short period of time (acute) or may be reoccurring (chronic). The cause of pelvic pain may be related to disorders affecting the female reproductive organs (gynecologic), but it may also be related to the bladder, kidney stones, an intestinal complication, or muscle or skeletal problems. Getting help right away for pelvic pain is important, especially if there has been severe, sharp, or a sudden onset of unusual pain. It is also important to get help right away because some types of pelvic pain can be life threatening.  CAUSES  Below are only some of the causes of pelvic pain. The causes of pelvic pain can be in one of several categories.   Gynecologic.  Pelvic inflammatory disease.  Sexually transmitted infection.  Ovarian cyst or a twisted ovarian ligament (ovarian torsion).  Uterine lining that grows outside the uterus (endometriosis).  Fibroids, cysts, or tumors.  Ovulation.  Pregnancy.  Pregnancy that occurs outside the uterus (ectopic pregnancy).  Miscarriage.  Labor.  Abruption of the placenta or ruptured uterus.  Infection.  Uterine infection (endometritis).  Bladder infection.  Diverticulitis.  Miscarriage related to a uterine infection (septic abortion).  Bladder.  Inflammation of the bladder (cystitis).  Kidney stone(s).  Gastrointestinal.  Constipation.  Diverticulitis.  Neurologic.  Trauma.  Feeling pelvic pain because of mental or emotional causes (psychosomatic).  Cancers of the bowel or pelvis. EVALUATION  Your caregiver will want to take a careful history of your concerns. This includes recent changes in your health, a careful gynecologic history of your periods (menses), and a sexual history. Obtaining your family  history and medical history is also important. Your caregiver may suggest a pelvic exam. A pelvic exam will help identify the location and severity of the pain. It also helps in the evaluation of which organ system may be involved. In order to identify the cause of the pelvic pain and be properly treated, your caregiver may order tests. These tests may include:   A pregnancy test.  Pelvic ultrasonography.  An X-ray exam of the abdomen.  A urinalysis or evaluation of vaginal discharge.  Blood tests. HOME CARE INSTRUCTIONS   Only take over-the-counter or prescription medicines for pain, discomfort, or fever as directed by your caregiver.   Rest as directed by your caregiver.   Eat a balanced diet.   Drink enough fluids to make your urine clear or pale yellow, or as directed.   Avoid sexual intercourse if it causes pain.   Apply warm or cold compresses to the lower abdomen depending on which one helps the pain.   Avoid stressful situations.   Keep a journal of your pelvic pain. Write down when it started, where the pain is located, and if there are things that seem to be associated with the pain, such as food or your menstrual cycle.  Follow up with your caregiver as directed.  SEEK MEDICAL CARE IF:  Your medicine does not help your pain.  You have abnormal vaginal discharge. SEEK IMMEDIATE MEDICAL CARE IF:   You have heavy bleeding from the vagina.   Your pelvic pain increases.   You feel light-headed or faint.   You have chills.   You have pain with urination or blood in your urine.   You have uncontrolled diarrhea   or vomiting.   You have a fever or persistent symptoms for more than 3 days.  You have a fever and your symptoms suddenly get worse.   You are being physically or sexually abused.  MAKE SURE YOU:  Understand these instructions.  Will watch your condition.  Will get help if you are not doing well or get worse. Document Released:  01/15/2004 Document Revised: 07/04/2013 Document Reviewed: 06/09/2011 Exit[employer] Patient Information 2015 [employer]. This information is not intended to replace advice given to you by your health care provider. Make sure you discuss any questions you have with your health care provider.  

## 2014-09-10 ENCOUNTER — Encounter: Payer: Self-pay | Admitting: Family Medicine

## 2014-09-10 DIAGNOSIS — Z8742 Personal history of other diseases of the female genital tract: Secondary | ICD-10-CM | POA: Insufficient documentation

## 2014-09-10 DIAGNOSIS — R1032 Left lower quadrant pain: Secondary | ICD-10-CM

## 2014-09-10 HISTORY — DX: Left lower quadrant pain: R10.32

## 2014-09-10 HISTORY — DX: Personal history of other diseases of the female genital tract: Z87.42

## 2014-09-10 NOTE — Assessment & Plan Note (Signed)
Steady and manageable for a couple of weeks. Initial pain timing was c/w midcycle which she has had before. The pain has persisted however. Likely an ovarian follicle has ruptured. She will seek immediate care if pain escalates and will proceed with pelvic ultrasound. Pregnancy test and urinalysis negative. May use NSAIDs prn

## 2014-09-10 NOTE — Progress Notes (Signed)
Valerie MastersShannon L Ruiz  [id number] 17-Nov-1975 09/10/2014      Progress Note-Follow Up  Subjective  Chief Complaint  Chief Complaint  Patient presents with  . Abdominal Pain    Left sided x 2 weeks    HPI  Patient is a 39 y.o. female in today for routine medical care. Patient is in today for evaluation of left lower quadrant pain. The pain started midcycle and was consistent with ovarian pain she has struggled with previously at this time the pain is persisted for several weeks. It is not escalating. There have been no change in bowel or bladder habits. There've been no fevers or chills. There is no vaginal discharge or other acute complaints. Otherwise she feels well and denies any other acute concerns. Denies CP/palp/SOB/HA/congestion/fevers/GI or GU c/o. Taking meds as prescribed  Past Medical History  Diagnosis Date  . Allergic rhinitis   . Asthma   . Chronic headaches   . LLQ pain 09/10/2014    History reviewed. No pertinent past surgical history.  Family History  Problem Relation Age of Onset  . Hyperthyroidism Mother   . Breast cancer Paternal Grandmother     History   Social History  . Marital Status: Single    Spouse Name: N/A  . Number of Children: N/A  . Years of Education: N/A   Occupational History  . Not on file.   Social History Main Topics  . Smoking status: Never Smoker   . Smokeless tobacco: Not on file  . Alcohol Use: 1.5 oz/week    3 drink(s) per week  . Drug Use: No  . Sexual Activity:    Partners: Female   Other Topics Concern  . Not on file   Social History Narrative    Current Outpatient Prescriptions on File Prior to Visit  Medication Sig Dispense Refill  . cyclobenzaprine (FLEXERIL) 10 MG tablet Take 0.5-1tab TID prn headache 30 tablet 3  . metoprolol tartrate (LOPRESSOR) 25 MG tablet TAKE 1/2 TABLETS (12.5 MG TOTAL) BY MOUTH 2 (TWO) TIMES DAILY. 60 tablet 0   No current facility-administered medications on file prior to  visit.    No Known Allergies  Review of Systems  Review of Systems  Constitutional: Negative for fever and malaise/fatigue.  HENT: Negative for congestion.   Eyes: Negative for discharge.  Respiratory: Negative for shortness of breath.   Cardiovascular: Negative for chest pain, palpitations and leg swelling.  Gastrointestinal: Positive for abdominal pain. Negative for nausea, diarrhea, constipation, blood in stool and melena.  Genitourinary: Negative for dysuria, urgency, frequency, hematuria and flank pain.  Musculoskeletal: Negative for myalgias and falls.  Skin: Negative for rash.  Neurological: Negative for loss of consciousness and headaches.  Endo/Heme/Allergies: Negative for polydipsia.  Psychiatric/Behavioral: Negative for depression and suicidal ideas. The patient is not nervous/anxious and does not have insomnia.     Objective  BP 120/70 mmHg  Pulse 80  Temp(Src) 97.8 F (36.6 C) (Oral)  Ht 5\' 2"  (1.575 m)  Wt 129 lb (58.514 kg)  BMI 23.59 kg/m2  SpO2 99%  LMP 08/09/2014  Physical Exam  Physical Exam  Constitutional: She is oriented to person, place, and time and well-developed, well-nourished, and in no distress. No distress.  HENT:  Head: Normocephalic and atraumatic.  Eyes: Conjunctivae are normal.  Neck: Neck supple. No thyromegaly present.  Cardiovascular: Normal rate, regular rhythm and normal heart sounds.   No murmur heard. Pulmonary/Chest: Effort normal and breath sounds normal. She has no wheezes.  Abdominal:  Soft. Bowel sounds are normal. She exhibits no distension and no mass. There is tenderness. There is no rebound and no guarding.  LLQ pain with palp, mild  Musculoskeletal: She exhibits no edema.  Lymphadenopathy:    She has no cervical adenopathy.  Neurological: She is alert and oriented to person, place, and time.  Skin: Skin is warm and dry. No rash noted. She is not diaphoretic.  Psychiatric: Memory, affect and judgment normal.     Lab Results  Component Value Date   TSH 0.72 07/29/2011   Lab Results  Component Value Date   WBC 5.1 07/29/2011   HGB 13.8 07/29/2011   HCT 41.1 07/29/2011   MCV 95.7 07/29/2011   PLT 170.0 07/29/2011   Lab Results  Component Value Date   CREATININE 0.55 06/09/2013   BUN 11 06/09/2013   NA 138 06/09/2013   K 3.7 06/09/2013   CL 102 06/09/2013   CO2 27 06/09/2013   Lab Results  Component Value Date   ALT 15 06/09/2013   AST 18 06/09/2013   ALKPHOS 47 06/09/2013   BILITOT 1.2 06/09/2013   No results found for: CHOL No results found for: HDL No results found for: LDLCALC No results found for: TRIG No results found for: CHOLHDL   Assessment & Plan  LLQ pain Steady and manageable for a couple of weeks. Initial pain timing was c/w midcycle which she has had before. The pain has persisted however. Likely an ovarian follicle has ruptured. She will seek immediate care if pain escalates and will proceed with pelvic ultrasound. Pregnancy test and urinalysis negative. May use NSAIDs prn

## 2014-09-18 ENCOUNTER — Ambulatory Visit: Payer: BC Managed Care – PPO | Admitting: Family Medicine

## 2014-09-20 ENCOUNTER — Ambulatory Visit (HOSPITAL_COMMUNITY): Payer: BC Managed Care – PPO

## 2014-10-01 ENCOUNTER — Other Ambulatory Visit: Payer: Self-pay | Admitting: Internal Medicine

## 2014-10-02 ENCOUNTER — Other Ambulatory Visit: Payer: Self-pay | Admitting: Internal Medicine

## 2014-10-02 MED ORDER — METOPROLOL TARTRATE 25 MG PO TABS: ORAL_TABLET | ORAL | Status: DC

## 2014-10-02 NOTE — Addendum Note (Signed)
Addended by: Kern Reap B on: 10/02/2014 02:14 PM   Modules accepted: Orders

## 2014-10-27 ENCOUNTER — Encounter: Payer: Self-pay | Admitting: Family Medicine

## 2014-10-27 ENCOUNTER — Ambulatory Visit (INDEPENDENT_AMBULATORY_CARE_PROVIDER_SITE_OTHER): Payer: BC Managed Care – PPO | Admitting: Family Medicine

## 2014-10-27 VITALS — BP 112/70 | HR 70 | Ht 62.0 in | Wt 127.0 lb

## 2014-10-27 DIAGNOSIS — M999 Biomechanical lesion, unspecified: Secondary | ICD-10-CM

## 2014-10-27 DIAGNOSIS — R51 Headache: Secondary | ICD-10-CM

## 2014-10-27 DIAGNOSIS — M9901 Segmental and somatic dysfunction of cervical region: Secondary | ICD-10-CM

## 2014-10-27 DIAGNOSIS — M9902 Segmental and somatic dysfunction of thoracic region: Secondary | ICD-10-CM | POA: Diagnosis not present

## 2014-10-27 DIAGNOSIS — M9903 Segmental and somatic dysfunction of lumbar region: Secondary | ICD-10-CM | POA: Diagnosis not present

## 2014-10-27 DIAGNOSIS — G4486 Cervicogenic headache: Secondary | ICD-10-CM

## 2014-10-27 NOTE — Patient Instructions (Signed)
Great to see you Ice can help when needed Try to get the rotation of lower back after rowing Continue the vitamins.  Happy Friday Lets space you out to 8 weeks.

## 2014-10-27 NOTE — Progress Notes (Signed)
  [facility] [address] [place], [place] [address] Phone: [phone] Subjective:    CC: Headache follow-up   Lower back pain follow-up [id number] AVELEEN NEVERS is a 39 y.o. female coming in with complaint of headache. Patient was seen previously for a cervicogenic headache and sacroiliac dysfunction. Patient was given different exercises and we discussed over-the-counter medications. It has been 6 weeks since her from previous visit. Patient states overall she is feeling significantly better. Patient does come back from a vacation and is feeling very relaxed. Has not needed her muscle relaxer at all. Continues of vitamins and very regularly.   Past medical history, social, surgical and family history all reviewed in electronic medical record.   Review of Systems: No headache, visual changes, nausea, vomiting, diarrhea, constipation, dizziness, abdominal pain, skin rash, fevers, chills, night sweats, weight loss, swollen lymph nodes, body aches, joint swelling, muscle aches, chest pain, shortness of breath, mood changes.   Objective Blood pressure 112/70, pulse 70, height  (1.575 m), weight 127 lb (57.607 kg).  General: No apparent distress alert and oriented x3 mood and affect normal, dressed appropriately.  HEENT: Pupils equal, extraocular movements intact  Respiratory: Patient's speak in full sentences and does not appear short of breath  Cardiovascular: No lower extremity edema, non tender, no erythema  Skin: Warm dry intact with no signs of infection or rash on extremities or on axial skeleton.  Abdomen: Soft nontender  Neuro: Cranial nerves II through XII are intact, neurovascularly intact in all extremities with 2+ DTRs and 2+ pulses.  Lymph: No lymphadenopathy of posterior or anterior cervical chain or axillae bilaterally.  Gait normal with good balance and coordination.  MSK:  Non tender with full range of motion and good stability and  symmetric strength and tone of shoulders, elbows, wrist, hip, knee and ankles bilaterally. Patient does have hyper mobility of the hands and wrists bilaterally   Neck:  improvement in the severity of the lordosis. No palpable stepoffs. Negative Spurling's maneuver. Full range of motion  Grip strength and sensation normal in bilateral hands Strength good C4 to T1 distribution No sensory change to C4 to T1 Negative Hoffman sign bilaterally Reflexes normal   OMT Physical Exam     Cervical  C2 flexed rotated and side bent left C4 flexed rotated and side bent right  Thoracic  left elevated first rib with T1 extended and rotated and side bent left T3 extended rotated and side bent to right T5 extended rotated and side bent left  Lumbar L2 flexed rotated and side bent left  Sacrum Left on left  Mild change in better   Impression and Recommendations:     This case required medical decision making of moderate complexity.

## 2014-10-27 NOTE — Assessment & Plan Note (Signed)
Patient is doing significantly better with this treatment. Discuss with her to continue the scapular training as well as the postural changing. Patient is going to be back in a work environment and will be a more scheduled basis but also has increasing anxiety which may cause patient's obese needed to see more. Patient will continue to do the exercises we discussed him continue the vitamins. Patient will come back and see me again in 6-8 weeks for further evaluation and treatment.

## 2014-10-27 NOTE — Assessment & Plan Note (Signed)
Decision today to treat with OMT was based on Physical Exam  After verbal consent patient was treated with HVLA, ME techniques in cervical, rib, thoracic, lumbar and sacral areas  Patient tolerated the procedure well with improvement in symptoms  Patient given exercises, stretches and lifestyle modifications  See medications in patient instructions if given  Patient will follow up in 6-8 weeks        

## 2014-10-27 NOTE — Progress Notes (Signed)
Pre visit review using our clinic review tool, if applicable. No additional management support is needed unless otherwise documented below in the visit note. 

## 2014-12-04 ENCOUNTER — Other Ambulatory Visit: Payer: Self-pay | Admitting: Internal Medicine

## 2014-12-04 MED ORDER — METOPROLOL TARTRATE 25 MG PO TABS: ORAL_TABLET | ORAL | Status: DC

## 2014-12-14 ENCOUNTER — Telehealth: Payer: Self-pay | Admitting: Behavioral Health

## 2014-12-14 NOTE — Telephone Encounter (Signed)
Unable to reach patient at time of Pre-Visit Call.  Left message for patient to return call when available.    

## 2014-12-15 ENCOUNTER — Encounter: Payer: Self-pay | Admitting: Family Medicine

## 2014-12-15 ENCOUNTER — Ambulatory Visit (INDEPENDENT_AMBULATORY_CARE_PROVIDER_SITE_OTHER): Payer: BC Managed Care – PPO | Admitting: Family Medicine

## 2014-12-15 VITALS — BP 109/68 | HR 87 | Temp 98.4°F | Ht 62.0 in | Wt 126.6 lb

## 2014-12-15 DIAGNOSIS — G4486 Cervicogenic headache: Secondary | ICD-10-CM

## 2014-12-15 DIAGNOSIS — R51 Headache: Secondary | ICD-10-CM

## 2014-12-15 DIAGNOSIS — Z0000 Encounter for general adult medical examination without abnormal findings: Secondary | ICD-10-CM

## 2014-12-15 DIAGNOSIS — R1032 Left lower quadrant pain: Secondary | ICD-10-CM

## 2014-12-15 DIAGNOSIS — J309 Allergic rhinitis, unspecified: Secondary | ICD-10-CM

## 2014-12-15 LAB — CBC
HEMATOCRIT: 38 % (ref 36.0–46.0)
HEMOGLOBIN: 12.9 g/dL (ref 12.0–15.0)
MCHC: 34 g/dL (ref 30.0–36.0)
MCV: 93.1 fl (ref 78.0–100.0)
PLATELETS: 182 10*3/uL (ref 150.0–400.0)
RBC: 4.08 Mil/uL (ref 3.87–5.11)
RDW: 12.5 % (ref 11.5–15.5)
WBC: 3.9 10*3/uL — ABNORMAL LOW (ref 4.0–10.5)

## 2014-12-15 LAB — LIPID PANEL
Cholesterol: 142 mg/dL (ref 0–200)
HDL: 59 mg/dL (ref >39.00)
LDL Cholesterol: 76 mg/dL (ref 0–99)
NonHDL: 83.1
Total CHOL/HDL Ratio: 2
Triglycerides: 38 mg/dL (ref 0.0–149.0)
VLDL: 7.6 mg/dL (ref 0.0–40.0)

## 2014-12-15 LAB — COMPREHENSIVE METABOLIC PANEL
ALBUMIN: 4.2 g/dL (ref 3.5–5.2)
ALT: 9 U/L (ref 0–35)
AST: 14 U/L (ref 0–37)
Alkaline Phosphatase: 46 U/L (ref 39–117)
BUN: 13 mg/dL (ref 6–23)
CHLORIDE: 108 meq/L (ref 96–112)
CO2: 26 mEq/L (ref 19–32)
Calcium: 9.6 mg/dL (ref 8.4–10.5)
Creatinine, Ser: 0.69 mg/dL (ref 0.40–1.20)
GFR: 101.3 mL/min (ref >60.00)
Glucose, Bld: 89 mg/dL (ref 70–99)
Potassium: 5.5 mEq/L — ABNORMAL HIGH (ref 3.5–5.1)
SODIUM: 141 meq/L (ref 135–145)
TOTAL PROTEIN: 7.3 g/dL (ref 6.0–8.3)
Total Bilirubin: 1 mg/dL (ref 0.2–1.2)

## 2014-12-15 LAB — TSH: TSH: 1.4 u[IU]/mL (ref 0.35–4.50)

## 2014-12-15 NOTE — Patient Instructions (Signed)
Preventive Care for Adults, Female A healthy lifestyle and preventive care can promote health and wellness. Preventive health guidelines for women include the following key practices.  A routine yearly physical is a good way to check with your health care provider about your health and preventive screening. It is a chance to share any concerns and updates on your health and to receive a thorough exam.  Visit your dentist for a routine exam and preventive care every 6 months. Brush your teeth twice a day and floss once a day. Good oral hygiene prevents tooth decay and gum disease.  The frequency of eye exams is based on your age, health, family medical history, use of contact lenses, and other factors. Follow your health care provider's recommendations for frequency of eye exams.  Eat a healthy diet. Foods like vegetables, fruits, whole grains, low-fat dairy products, and lean protein foods contain the nutrients you need without too many calories. Decrease your intake of foods high in solid fats, added sugars, and salt. Eat the right amount of calories for you.Get information about a proper diet from your health care provider, if necessary.  Regular physical exercise is one of the most important things you can do for your health. Most adults should get at least 150 minutes of moderate-intensity exercise (any activity that increases your heart rate and causes you to sweat) each week. In addition, most adults need muscle-strengthening exercises on 2 or more days a week.  Maintain a healthy weight. The body mass index (BMI) is a screening tool to identify possible weight problems. It provides an estimate of body fat based on height and weight. Your health care provider can find your BMI and can help you achieve or maintain a healthy weight.For adults 20 years and older:  A BMI below 18.5 is considered underweight.  A BMI of 18.5 to 24.9 is normal.  A BMI of 25 to 29.9 is considered overweight.  A  BMI of 30 and above is considered obese.  Maintain normal blood lipids and cholesterol levels by exercising and minimizing your intake of saturated fat. Eat a balanced diet with plenty of fruit and vegetables. Blood tests for lipids and cholesterol should begin at age 45 and be repeated every 5 years. If your lipid or cholesterol levels are high, you are over 50, or you are at high risk for heart disease, you may need your cholesterol levels checked more frequently.Ongoing high lipid and cholesterol levels should be treated with medicines if diet and exercise are not working.  If you smoke, find out from your health care provider how to quit. If you do not use tobacco, do not start.  Lung cancer screening is recommended for adults aged 45-80 years who are at high risk for developing lung cancer because of a history of smoking. A yearly low-dose CT scan of the lungs is recommended for people who have at least a 30-pack-year history of smoking and are a current smoker or have quit within the past 15 years. A pack year of smoking is smoking an average of 1 pack of cigarettes a day for 1 year (for example: 1 pack a day for 30 years or 2 packs a day for 15 years). Yearly screening should continue until the smoker has stopped smoking for at least 15 years. Yearly screening should be stopped for people who develop a health problem that would prevent them from having lung cancer treatment.  If you are pregnant, do not drink alcohol. If you are  breastfeeding, be very cautious about drinking alcohol. If you are not pregnant and choose to drink alcohol, do not have more than 1 drink per day. One drink is considered to be 12 ounces (355 mL) of beer, 5 ounces (148 mL) of wine, or 1.5 ounces (44 mL) of liquor.  Avoid use of street drugs. Do not share needles with anyone. Ask for help if you need support or instructions about stopping the use of drugs.  High blood pressure causes heart disease and increases the risk  of stroke. Your blood pressure should be checked at least every 1 to 2 years. Ongoing high blood pressure should be treated with medicines if weight loss and exercise do not work.  If you are 55-79 years old, ask your health care provider if you should take aspirin to prevent strokes.  Diabetes screening is done by taking a blood sample to check your blood glucose level after you have not eaten for a certain period of time (fasting). If you are not overweight and you do not have risk factors for diabetes, you should be screened once every 3 years starting at age 45. If you are overweight or obese and you are 40-70 years of age, you should be screened for diabetes every year as part of your cardiovascular risk assessment.  Breast cancer screening is essential preventive care for women. You should practice "breast self-awareness." This means understanding the normal appearance and feel of your breasts and may include breast self-examination. Any changes detected, no matter how small, should be reported to a health care provider. Women in their 20s and 30s should have a clinical breast exam (CBE) by a health care provider as part of a regular health exam every 1 to 3 years. After age 40, women should have a CBE every year. Starting at age 40, women should consider having a mammogram (breast X-ray test) every year. Women who have a family history of breast cancer should talk to their health care provider about genetic screening. Women at a high risk of breast cancer should talk to their health care providers about having an MRI and a mammogram every year.  Breast cancer gene (BRCA)-related cancer risk assessment is recommended for women who have family members with BRCA-related cancers. BRCA-related cancers include breast, ovarian, tubal, and peritoneal cancers. Having family members with these cancers may be associated with an increased risk for harmful changes (mutations) in the breast cancer genes BRCA1 and  BRCA2. Results of the assessment will determine the need for genetic counseling and BRCA1 and BRCA2 testing.  Your health care provider may recommend that you be screened regularly for cancer of the pelvic organs (ovaries, uterus, and vagina). This screening involves a pelvic examination, including checking for microscopic changes to the surface of your cervix (Pap test). You may be encouraged to have this screening done every 3 years, beginning at age 21.  For women ages 30-65, health care providers may recommend pelvic exams and Pap testing every 3 years, or they may recommend the Pap and pelvic exam, combined with testing for human papilloma virus (HPV), every 5 years. Some types of HPV increase your risk of cervical cancer. Testing for HPV may also be done on women of any age with unclear Pap test results.  Other health care providers may not recommend any screening for nonpregnant women who are considered low risk for pelvic cancer and who do not have symptoms. Ask your health care provider if a screening pelvic exam is right for   you.  If you have had past treatment for cervical cancer or a condition that could lead to cancer, you need Pap tests and screening for cancer for at least 20 years after your treatment. If Pap tests have been discontinued, your risk factors (such as having a new sexual partner) need to be reassessed to determine if screening should resume. Some women have medical problems that increase the chance of getting cervical cancer. In these cases, your health care provider may recommend more frequent screening and Pap tests.  Colorectal cancer can be detected and often prevented. Most routine colorectal cancer screening begins at the age of 50 years and continues through age 75 years. However, your health care provider may recommend screening at an earlier age if you have risk factors for colon cancer. On a yearly basis, your health care provider may provide home test kits to check  for hidden blood in the stool. Use of a small camera at the end of a tube, to directly examine the colon (sigmoidoscopy or colonoscopy), can detect the earliest forms of colorectal cancer. Talk to your health care provider about this at age 50, when routine screening begins. Direct exam of the colon should be repeated every 5-10 years through age 75 years, unless early forms of precancerous polyps or small growths are found.  People who are at an increased risk for hepatitis B should be screened for this virus. You are considered at high risk for hepatitis B if:  You were born in a country where hepatitis B occurs often. Talk with your health care provider about which countries are considered high risk.  Your parents were born in a high-risk country and you have not received a shot to protect against hepatitis B (hepatitis B vaccine).  You have HIV or AIDS.  You use needles to inject street drugs.  You live with, or have sex with, someone who has hepatitis B.  You get hemodialysis treatment.  You take certain medicines for conditions like cancer, organ transplantation, and autoimmune conditions.  Hepatitis C blood testing is recommended for all people born from 1945 through 1965 and any individual with known risks for hepatitis C.  Practice safe sex. Use condoms and avoid high-risk sexual practices to reduce the spread of sexually transmitted infections (STIs). STIs include gonorrhea, chlamydia, syphilis, trichomonas, herpes, HPV, and human immunodeficiency virus (HIV). Herpes, HIV, and HPV are viral illnesses that have no cure. They can result in disability, cancer, and death.  You should be screened for sexually transmitted illnesses (STIs) including gonorrhea and chlamydia if:  You are sexually active and are younger than 24 years.  You are older than 24 years and your health care provider tells you that you are at risk for this type of infection.  Your sexual activity has changed  since you were last screened and you are at an increased risk for chlamydia or gonorrhea. Ask your health care provider if you are at risk.  If you are at risk of being infected with HIV, it is recommended that you take a prescription medicine daily to prevent HIV infection. This is called preexposure prophylaxis (PrEP). You are considered at risk if:  You are sexually active and do not regularly use condoms or know the HIV status of your partner(s).  You take drugs by injection.  You are sexually active with a partner who has HIV.  Talk with your health care provider about whether you are at high risk of being infected with HIV. If   you choose to begin PrEP, you should first be tested for HIV. You should then be tested every 3 months for as long as you are taking PrEP.  Osteoporosis is a disease in which the bones lose minerals and strength with aging. This can result in serious bone fractures or breaks. The risk of osteoporosis can be identified using a bone density scan. Women ages 67 years and over and women at risk for fractures or osteoporosis should discuss screening with their health care providers. Ask your health care provider whether you should take a calcium supplement or vitamin D to reduce the rate of osteoporosis.  Menopause can be associated with physical symptoms and risks. Hormone replacement therapy is available to decrease symptoms and risks. You should talk to your health care provider about whether hormone replacement therapy is right for you.  Use sunscreen. Apply sunscreen liberally and repeatedly throughout the day. You should seek shade when your shadow is shorter than you. Protect yourself by wearing long sleeves, pants, a wide-brimmed hat, and sunglasses year round, whenever you are outdoors.  Once a month, do a whole body skin exam, using a mirror to look at the skin on your back. Tell your health care provider of new moles, moles that have irregular borders, moles that  are larger than a pencil eraser, or moles that have changed in shape or color.  Stay current with required vaccines (immunizations).  Influenza vaccine. All adults should be immunized every year.  Tetanus, diphtheria, and acellular pertussis (Td, Tdap) vaccine. Pregnant women should receive 1 dose of Tdap vaccine during each pregnancy. The dose should be obtained regardless of the length of time since the last dose. Immunization is preferred during the 27th-36th week of gestation. An adult who has not previously received Tdap or who does not know her vaccine status should receive 1 dose of Tdap. This initial dose should be followed by tetanus and diphtheria toxoids (Td) booster doses every 10 years. Adults with an unknown or incomplete history of completing a 3-dose immunization series with Td-containing vaccines should begin or complete a primary immunization series including a Tdap dose. Adults should receive a Td booster every 10 years.  Varicella vaccine. An adult without evidence of immunity to varicella should receive 2 doses or a second dose if she has previously received 1 dose. Pregnant females who do not have evidence of immunity should receive the first dose after pregnancy. This first dose should be obtained before leaving the health care facility. The second dose should be obtained 4-8 weeks after the first dose.  Human papillomavirus (HPV) vaccine. Females aged 13-26 years who have not received the vaccine previously should obtain the 3-dose series. The vaccine is not recommended for use in pregnant females. However, pregnancy testing is not needed before receiving a dose. If a female is found to be pregnant after receiving a dose, no treatment is needed. In that case, the remaining doses should be delayed until after the pregnancy. Immunization is recommended for any person with an immunocompromised condition through the age of 61 years if she did not get any or all doses earlier. During the  3-dose series, the second dose should be obtained 4-8 weeks after the first dose. The third dose should be obtained 24 weeks after the first dose and 16 weeks after the second dose.  Zoster vaccine. One dose is recommended for adults aged 30 years or older unless certain conditions are present.  Measles, mumps, and rubella (MMR) vaccine. Adults born  before 1957 generally are considered immune to measles and mumps. Adults born in 1957 or later should have 1 or more doses of MMR vaccine unless there is a contraindication to the vaccine or there is laboratory evidence of immunity to each of the three diseases. A routine second dose of MMR vaccine should be obtained at least 28 days after the first dose for students attending postsecondary schools, health care workers, or international travelers. People who received inactivated measles vaccine or an unknown type of measles vaccine during 1963-1967 should receive 2 doses of MMR vaccine. People who received inactivated mumps vaccine or an unknown type of mumps vaccine before 1979 and are at high risk for mumps infection should consider immunization with 2 doses of MMR vaccine. For females of childbearing age, rubella immunity should be determined. If there is no evidence of immunity, females who are not pregnant should be vaccinated. If there is no evidence of immunity, females who are pregnant should delay immunization until after pregnancy. Unvaccinated health care workers born before 1957 who lack laboratory evidence of measles, mumps, or rubella immunity or laboratory confirmation of disease should consider measles and mumps immunization with 2 doses of MMR vaccine or rubella immunization with 1 dose of MMR vaccine.  Pneumococcal 13-valent conjugate (PCV13) vaccine. When indicated, a person who is uncertain of his immunization history and has no record of immunization should receive the PCV13 vaccine. All adults 65 years of age and older should receive this  vaccine. An adult aged 19 years or older who has certain medical conditions and has not been previously immunized should receive 1 dose of PCV13 vaccine. This PCV13 should be followed with a dose of pneumococcal polysaccharide (PPSV23) vaccine. Adults who are at high risk for pneumococcal disease should obtain the PPSV23 vaccine at least 8 weeks after the dose of PCV13 vaccine. Adults older than 38 years of age who have normal immune system function should obtain the PPSV23 vaccine dose at least 1 year after the dose of PCV13 vaccine.  Pneumococcal polysaccharide (PPSV23) vaccine. When PCV13 is also indicated, PCV13 should be obtained first. All adults aged 65 years and older should be immunized. An adult younger than age 65 years who has certain medical conditions should be immunized. Any person who resides in a nursing home or long-term care facility should be immunized. An adult smoker should be immunized. People with an immunocompromised condition and certain other conditions should receive both PCV13 and PPSV23 vaccines. People with human immunodeficiency virus (HIV) infection should be immunized as soon as possible after diagnosis. Immunization during chemotherapy or radiation therapy should be avoided. Routine use of PPSV23 vaccine is not recommended for American Indians, [place] Natives, or people younger than 65 years unless there are medical conditions that require PPSV23 vaccine. When indicated, people who have unknown immunization and have no record of immunization should receive PPSV23 vaccine. One-time revaccination 5 years after the first dose of PPSV23 is recommended for people aged 19-64 years who have chronic kidney failure, nephrotic syndrome, asplenia, or immunocompromised conditions. People who received 1-2 doses of PPSV23 before age 65 years should receive another dose of PPSV23 vaccine at age 65 years or later if at least 5 years have passed since the previous dose. Doses of PPSV23 are not  needed for people immunized with PPSV23 at or after age 65 years.  Meningococcal vaccine. Adults with asplenia or persistent complement component deficiencies should receive 2 doses of quadrivalent meningococcal conjugate (MenACWY-D) vaccine. The doses should be obtained   at least 2 months apart. Microbiologists working with certain meningococcal bacteria, [place] recruits, people at risk during an outbreak, and people who travel to or live in countries with a high rate of meningitis should be immunized. A first-year college student up through age 34 years who is living in a residence [place] should receive a dose if she did not receive a dose on or after her 16th birthday. Adults who have certain high-risk conditions should receive one or more doses of vaccine.  Hepatitis A vaccine. Adults who wish to be protected from this disease, have certain high-risk conditions, work with hepatitis A-infected animals, work in hepatitis A research labs, or travel to or work in countries with a high rate of hepatitis A should be immunized. Adults who were previously unvaccinated and who anticipate close contact with an international adoptee during the first 60 days after arrival in the [place] from a country with a high rate of hepatitis A should be immunized.  Hepatitis B vaccine. Adults who wish to be protected from this disease, have certain high-risk conditions, may be exposed to blood or other infectious body fluids, are household contacts or sex partners of hepatitis B positive people, are clients or workers in certain care facilities, or travel to or work in countries with a high rate of hepatitis B should be immunized.  Haemophilus influenzae type b (Hib) vaccine. A previously unvaccinated person with asplenia or sickle cell disease or having a scheduled splenectomy should receive 1 dose of Hib vaccine. Regardless of previous immunization, a recipient of a hematopoietic stem cell transplant should receive a  3-dose series 6-12 months after her successful transplant. Hib vaccine is not recommended for adults with HIV infection. Preventive Services / Frequency Ages 35 to 4 years  Blood pressure check.** / Every 3-5 years.  Lipid and cholesterol check.** / Every 5 years beginning at age 60.  Clinical breast exam.** / Every 3 years for women in their 71s and 10s.  BRCA-related cancer risk assessment.** / For women who have family members with a BRCA-related cancer (breast, ovarian, tubal, or peritoneal cancers).  Pap test.** / Every 2 years from ages 76 through 26. Every 3 years starting at age 61 through age 76 or 93 with a history of 3 consecutive normal Pap tests.  HPV screening.** / Every 3 years from ages 37 through ages 60 to 51 with a history of 3 consecutive normal Pap tests.  Hepatitis C blood test.** / For any individual with known risks for hepatitis C.  Skin self-exam. / Monthly.  Influenza vaccine. / Every year.  Tetanus, diphtheria, and acellular pertussis (Tdap, Td) vaccine.** / Consult your health care provider. Pregnant women should receive 1 dose of Tdap vaccine during each pregnancy. 1 dose of Td every 10 years.  Varicella vaccine.** / Consult your health care provider. Pregnant females who do not have evidence of immunity should receive the first dose after pregnancy.  HPV vaccine. / 3 doses over 6 months, if 93 and younger. The vaccine is not recommended for use in pregnant females. However, pregnancy testing is not needed before receiving a dose.  Measles, mumps, rubella (MMR) vaccine.** / You need at least 1 dose of MMR if you were born in 1957 or later. You may also need a 2nd dose. For females of childbearing age, rubella immunity should be determined. If there is no evidence of immunity, females who are not pregnant should be vaccinated. If there is no evidence of immunity, females who are  pregnant should delay immunization until after pregnancy.  Pneumococcal  13-valent conjugate (PCV13) vaccine.** / Consult your health care provider.  Pneumococcal polysaccharide (PPSV23) vaccine.** / 1 to 2 doses if you smoke cigarettes or if you have certain conditions.  Meningococcal vaccine.** / 1 dose if you are age 68 to 8 years and a Market researcher living in a residence [place], or have one of several medical conditions, you need to get vaccinated against meningococcal disease. You may also need additional booster doses.  Hepatitis A vaccine.** / Consult your health care provider.  Hepatitis B vaccine.** / Consult your health care provider.  Haemophilus influenzae type b (Hib) vaccine.** / Consult your health care provider. Ages 7 to 53 years  Blood pressure check.** / Every year.  Lipid and cholesterol check.** / Every 5 years beginning at age 25 years.  Lung cancer screening. / Every year if you are aged 11-80 years and have a 30-pack-year history of smoking and currently smoke or have quit within the past 15 years. Yearly screening is stopped once you have quit smoking for at least 15 years or develop a health problem that would prevent you from having lung cancer treatment.  Clinical breast exam.** / Every year after age 48 years.  BRCA-related cancer risk assessment.** / For women who have family members with a BRCA-related cancer (breast, ovarian, tubal, or peritoneal cancers).  Mammogram.** / Every year beginning at age 41 years and continuing for as long as you are in good health. Consult with your health care provider.  Pap test.** / Every 3 years starting at age 65 years through age 37 or 70 years with a history of 3 consecutive normal Pap tests.  HPV screening.** / Every 3 years from ages 72 years through ages 60 to 40 years with a history of 3 consecutive normal Pap tests.  Fecal occult blood test (FOBT) of stool. / Every year beginning at age 21 years and continuing until age 5 years. You may not need to do this test if you get  a colonoscopy every 10 years.  Flexible sigmoidoscopy or colonoscopy.** / Every 5 years for a flexible sigmoidoscopy or every 10 years for a colonoscopy beginning at age 35 years and continuing until age 48 years.  Hepatitis C blood test.** / For all people born from [birth year] through 1965 and any individual with known risks for hepatitis C.  Skin self-exam. / Monthly.  Influenza vaccine. / Every year.  Tetanus, diphtheria, and acellular pertussis (Tdap/Td) vaccine.** / Consult your health care provider. Pregnant women should receive 1 dose of Tdap vaccine during each pregnancy. 1 dose of Td every 10 years.  Varicella vaccine.** / Consult your health care provider. Pregnant females who do not have evidence of immunity should receive the first dose after pregnancy.  Zoster vaccine.** / 1 dose for adults aged 30 years or older.  Measles, mumps, rubella (MMR) vaccine.** / You need at least 1 dose of MMR if you were born in 1957 or later. You may also need a second dose. For females of childbearing age, rubella immunity should be determined. If there is no evidence of immunity, females who are not pregnant should be vaccinated. If there is no evidence of immunity, females who are pregnant should delay immunization until after pregnancy.  Pneumococcal 13-valent conjugate (PCV13) vaccine.** / Consult your health care provider.  Pneumococcal polysaccharide (PPSV23) vaccine.** / 1 to 2 doses if you smoke cigarettes or if you have certain conditions.  Meningococcal vaccine.** /  Consult your health care provider.  Hepatitis A vaccine.** / Consult your health care provider.  Hepatitis B vaccine.** / Consult your health care provider.  Haemophilus influenzae type b (Hib) vaccine.** / Consult your health care provider. Ages 64 years and over  Blood pressure check.** / Every year.  Lipid and cholesterol check.** / Every 5 years beginning at age 23 years.  Lung cancer screening. / Every year if you  are aged 16-80 years and have a 30-pack-year history of smoking and currently smoke or have quit within the past 15 years. Yearly screening is stopped once you have quit smoking for at least 15 years or develop a health problem that would prevent you from having lung cancer treatment.  Clinical breast exam.** / Every year after age 74 years.  BRCA-related cancer risk assessment.** / For women who have family members with a BRCA-related cancer (breast, ovarian, tubal, or peritoneal cancers).  Mammogram.** / Every year beginning at age 44 years and continuing for as long as you are in good health. Consult with your health care provider.  Pap test.** / Every 3 years starting at age 58 years through age 22 or 39 years with 3 consecutive normal Pap tests. Testing can be stopped between 65 and 70 years with 3 consecutive normal Pap tests and no abnormal Pap or HPV tests in the past 10 years.  HPV screening.** / Every 3 years from ages 64 years through ages 70 or 61 years with a history of 3 consecutive normal Pap tests. Testing can be stopped between 65 and 70 years with 3 consecutive normal Pap tests and no abnormal Pap or HPV tests in the past 10 years.  Fecal occult blood test (FOBT) of stool. / Every year beginning at age 40 years and continuing until age 27 years. You may not need to do this test if you get a colonoscopy every 10 years.  Flexible sigmoidoscopy or colonoscopy.** / Every 5 years for a flexible sigmoidoscopy or every 10 years for a colonoscopy beginning at age 7 years and continuing until age 32 years.  Hepatitis C blood test.** / For all people born from [birth year] through 1964 and any individual with known risks for hepatitis C.  Osteoporosis screening.** / A one-time screening for women ages 30 years and over and women at risk for fractures or osteoporosis.  Skin self-exam. / Monthly.  Influenza vaccine. / Every year.  Tetanus, diphtheria, and acellular pertussis (Tdap/Td)  vaccine.** / 1 dose of Td every 10 years.  Varicella vaccine.** / Consult your health care provider.  Zoster vaccine.** / 1 dose for adults aged 35 years or older.  Pneumococcal 13-valent conjugate (PCV13) vaccine.** / Consult your health care provider.  Pneumococcal polysaccharide (PPSV23) vaccine.** / 1 dose for all adults aged 46 years and older.  Meningococcal vaccine.** / Consult your health care provider.  Hepatitis A vaccine.** / Consult your health care provider.  Hepatitis B vaccine.** / Consult your health care provider.  Haemophilus influenzae type b (Hib) vaccine.** / Consult your health care provider. ** Family history and personal history of risk and conditions may change your health care provider's recommendations.   This information is not intended to replace advice given to you by your health care provider. Make sure you discuss any questions you have with your health care provider.   Document Released: 04/15/2001 Document Revised: 03/10/2014 Document Reviewed: 07/15/2010 Elsevier Interactive Patient Education [employer].

## 2014-12-15 NOTE — Progress Notes (Signed)
Pre visit review using our clinic review tool, if applicable. No additional management support is needed unless otherwise documented below in the visit note. 

## 2014-12-24 ENCOUNTER — Encounter: Payer: Self-pay | Admitting: Family Medicine

## 2014-12-24 DIAGNOSIS — Z0000 Encounter for general adult medical examination without abnormal findings: Secondary | ICD-10-CM

## 2014-12-24 HISTORY — DX: Encounter for general adult medical examination without abnormal findings: Z00.00

## 2014-12-24 NOTE — Progress Notes (Signed)
Subjective:    Patient ID: Valerie Ruiz, female    DOB: February 17, 1976, 39 y.o.   MRN: [id number]  Chief Complaint  Patient presents with  . Establish Care    HPI Patient is in today for new patient appointment to establish care. She feels well. No recent illness. Continues to struggle with ongoing back and neck pain and headaches but has experienced significant relief with adjustments by sports medicine. No other acute or new complaints noted today. She tries to maintain a heart healthy diet and stay active. Denies CP/palp/SOB/HA/congestion/fevers/GI or GU c/o. Taking meds as prescribed  Past Medical History  Diagnosis Date  . Allergic rhinitis   . Asthma   . Chronic headaches   . LLQ pain 09/10/2014  . Depression   . Glaucoma   . History of ovulatory pain 09/10/2014  . Preventative health care 12/24/2014    Past Surgical History  Procedure Laterality Date  . Ankle surgery      Broken ankle    Family History  Problem Relation Age of Onset  . Hyperthyroidism Mother   . Breast cancer Paternal Grandmother   . Cancer Paternal Grandmother     metastatic breast with mets to bone  . Gout Father   . Heart disease Maternal Grandmother 75    MI  . Stroke Maternal Grandfather     Social History   Social History  . Marital Status: Single    Spouse Name: N/A  . Number of Children: N/A  . Years of Education: N/A   Occupational History  . Not on file.   Social History Main Topics  . Smoking status: Never Smoker   . Smokeless tobacco: Never Used  . Alcohol Use: No  . Drug Use: No  . Sexual Activity:    Partners: Female     Comment: lives with spouse, works as Designer, industrial/product support at [employer]    Other Topics Concern  . Not on file   Social History Narrative    Outpatient Prescriptions Prior to Visit  Medication Sig Dispense Refill  . cyclobenzaprine (FLEXERIL) 10 MG tablet Take 0.5-1tab TID prn headache 30 tablet 3  .  metoprolol tartrate (LOPRESSOR) 25 MG tablet Take half tab twice daily 60 tablet 3   No facility-administered medications prior to visit.    No Known Allergies  Review of Systems  Constitutional: Negative for fever, chills and malaise/fatigue.  HENT: Negative for congestion and hearing loss.   Eyes: Negative for discharge.  Respiratory: Negative for cough, sputum production and shortness of breath.   Cardiovascular: Negative for chest pain, palpitations and leg swelling.  Gastrointestinal: Negative for heartburn, nausea, vomiting, abdominal pain, diarrhea, constipation and blood in stool.  Genitourinary: Negative for dysuria, urgency, frequency and hematuria.  Musculoskeletal: Negative for myalgias, back pain and falls.  Skin: Negative for rash.  Neurological: Negative for dizziness, sensory change, loss of consciousness, weakness and headaches.  Endo/Heme/Allergies: Negative for environmental allergies. Does not bruise/bleed easily.  Psychiatric/Behavioral: Negative for depression and suicidal ideas. The patient is not nervous/anxious and does not have insomnia.        Objective:    Physical Exam  Constitutional: She is oriented to person, place, and time. She appears well-developed and well-nourished. No distress.  HENT:  Head: Normocephalic and atraumatic.  Eyes: Conjunctivae are normal.  Neck: Neck supple. No thyromegaly present.  Cardiovascular: Normal rate, regular rhythm and normal heart sounds.   No murmur heard. Pulmonary/Chest: Effort normal and breath sounds normal. No  respiratory distress.  Abdominal: Soft. Bowel sounds are normal. She exhibits no distension and no mass. There is no tenderness.  Musculoskeletal: She exhibits no edema.  Lymphadenopathy:    She has no cervical adenopathy.  Neurological: She is alert and oriented to person, place, and time.  Skin: Skin is warm and dry.  Psychiatric: She has a normal mood and affect. Her behavior is normal.    BP  109/68 mmHg  Pulse 87  Temp(Src) 98.4 F (36.9 C) (Oral)  Ht 5\' 2"  (1.575 m)  Wt 126 lb 9.6 oz (57.425 kg)  BMI 23.15 kg/m2  SpO2 100%  LMP 11/24/2014 Wt Readings from Last 3 Encounters:  12/15/14 126 lb 9.6 oz (57.425 kg)  10/27/14 127 lb (57.607 kg)  09/09/14 129 lb (58.514 kg)     Lab Results  Component Value Date   WBC 3.9* 12/15/2014   HGB 12.9 12/15/2014   HCT 38.0 12/15/2014   PLT 182.0 12/15/2014   GLUCOSE 89 12/15/2014   CHOL 142 12/15/2014   TRIG 38.0 12/15/2014   HDL 59.00 12/15/2014   LDLCALC 76 12/15/2014   ALT 9 12/15/2014   AST 14 12/15/2014   NA 141 12/15/2014   K 5.5* 12/15/2014   CL 108 12/15/2014   CREATININE 0.69 12/15/2014   BUN 13 12/15/2014   CO2 26 12/15/2014   TSH 1.40 12/15/2014    Lab Results  Component Value Date   TSH 1.40 12/15/2014   Lab Results  Component Value Date   WBC 3.9* 12/15/2014   HGB 12.9 12/15/2014   HCT 38.0 12/15/2014   MCV 93.1 12/15/2014   PLT 182.0 12/15/2014   Lab Results  Component Value Date   NA 141 12/15/2014   K 5.5* 12/15/2014   CO2 26 12/15/2014   GLUCOSE 89 12/15/2014   BUN 13 12/15/2014   CREATININE 0.69 12/15/2014   BILITOT 1.0 12/15/2014   ALKPHOS 46 12/15/2014   AST 14 12/15/2014   ALT 9 12/15/2014   PROT 7.3 12/15/2014   ALBUMIN 4.2 12/15/2014   CALCIUM 9.6 12/15/2014   GFR 101.30 12/15/2014   Lab Results  Component Value Date   CHOL 142 12/15/2014   Lab Results  Component Value Date   HDL 59.00 12/15/2014   Lab Results  Component Value Date   LDLCALC 76 12/15/2014   Lab Results  Component Value Date   TRIG 38.0 12/15/2014   Lab Results  Component Value Date   CHOLHDL 2 12/15/2014   No results found for: HGBA1C     Assessment & Plan:   Problem List Items Addressed This Visit    Preventative health care    Patient encouraged to maintain heart healthy diet, regular exercise, adequate sleep. Consider daily probiotics. Take medications as prescribed. Given and  reviewed copy of ACP documents from [employer] of State and encouraged to complete and return. Follows with Gynecology in [place] and has an appt soon in [place]. Will have records forwarded to us.       Relevant Orders   TSH (Completed)   CBC (Completed)   Lipid panel (Completed)   Comprehensive metabolic panel (Completed)   Cervicogenic headache    Moist heat, streching Encouraged increased hydration, 64 ounces of clear fluids daily. Minimize alcohol and caffeine. Eat small frequent meals with lean proteins and complex carbs. Avoid high and low blood sugars. Get adequate sleep, 7-8 hours a night. Needs exercise daily preferably in the morning.      Allergic rhinitis - Primary  Other Visit Diagnoses    LLQ pain        Relevant Orders    CBC (Completed)    Comprehensive metabolic panel (Completed)       I am having Ms. Sundra Aland maintain her cyclobenzaprine and metoprolol tartrate.  No orders of the defined types were placed in this encounter.     Danise Edge, MD

## 2014-12-24 NOTE — Assessment & Plan Note (Signed)
Patient encouraged to maintain heart healthy diet, regular exercise, adequate sleep. Consider daily probiotics. Take medications as prescribed. Given and reviewed copy of ACP documents from [employer] Secretary of State and encouraged to complete and return. Follows with Gynecology in [place] and has an appt soon in [place]. Will have records forwarded to us.

## 2014-12-24 NOTE — Assessment & Plan Note (Signed)
Moist heat, streching Encouraged increased hydration, 64 ounces of clear fluids daily. Minimize alcohol and caffeine. Eat small frequent meals with lean proteins and complex carbs. Avoid high and low blood sugars. Get adequate sleep, 7-8 hours a night. Needs exercise daily preferably in the morning.

## 2014-12-26 ENCOUNTER — Encounter: Payer: Self-pay | Admitting: Family Medicine

## 2014-12-27 ENCOUNTER — Other Ambulatory Visit: Payer: Self-pay | Admitting: Family Medicine

## 2014-12-27 DIAGNOSIS — E875 Hyperkalemia: Secondary | ICD-10-CM

## 2014-12-29 ENCOUNTER — Ambulatory Visit (INDEPENDENT_AMBULATORY_CARE_PROVIDER_SITE_OTHER): Payer: BC Managed Care – PPO | Admitting: Family Medicine

## 2014-12-29 ENCOUNTER — Other Ambulatory Visit (INDEPENDENT_AMBULATORY_CARE_PROVIDER_SITE_OTHER): Payer: BC Managed Care – PPO

## 2014-12-29 ENCOUNTER — Encounter: Payer: Self-pay | Admitting: Family Medicine

## 2014-12-29 VITALS — BP 114/72 | HR 88 | Ht 62.0 in | Wt 126.0 lb

## 2014-12-29 DIAGNOSIS — M9903 Segmental and somatic dysfunction of lumbar region: Secondary | ICD-10-CM | POA: Diagnosis not present

## 2014-12-29 DIAGNOSIS — M9902 Segmental and somatic dysfunction of thoracic region: Secondary | ICD-10-CM

## 2014-12-29 DIAGNOSIS — E875 Hyperkalemia: Secondary | ICD-10-CM | POA: Diagnosis not present

## 2014-12-29 DIAGNOSIS — M9901 Segmental and somatic dysfunction of cervical region: Secondary | ICD-10-CM

## 2014-12-29 DIAGNOSIS — R51 Headache: Secondary | ICD-10-CM | POA: Diagnosis not present

## 2014-12-29 DIAGNOSIS — M999 Biomechanical lesion, unspecified: Secondary | ICD-10-CM

## 2014-12-29 DIAGNOSIS — G4486 Cervicogenic headache: Secondary | ICD-10-CM

## 2014-12-29 LAB — COMPREHENSIVE METABOLIC PANEL
ALT: 13 U/L (ref 0–35)
AST: 19 U/L (ref 0–37)
Albumin: 4.3 g/dL (ref 3.5–5.2)
Alkaline Phosphatase: 48 U/L (ref 39–117)
BUN: 11 mg/dL (ref 6–23)
CALCIUM: 9.6 mg/dL (ref 8.4–10.5)
CHLORIDE: 105 meq/L (ref 96–112)
CO2: 28 meq/L (ref 19–32)
Creatinine, Ser: 0.69 mg/dL (ref 0.40–1.20)
GFR: 101.28 mL/min (ref >60.00)
Glucose, Bld: 90 mg/dL (ref 70–99)
Potassium: 4.7 mEq/L (ref 3.5–5.1)
SODIUM: 141 meq/L (ref 135–145)
Total Bilirubin: 0.9 mg/dL (ref 0.2–1.2)
Total Protein: 7.5 g/dL (ref 6.0–8.3)

## 2014-12-29 NOTE — Progress Notes (Signed)
  Tawana ScaleZach Chistine Dematteo D.O. [facility] [address] [place], [place] [address] Phone: [phone] Subjective:    CC: Headache follow-up   Lower back pain follow-up FAO:[id number]:Subjective Valerie MastersShannon L Ruiz is a 39 y.o. female coming in with complaint of headache. Patient was seen previously for a cervicogenic headache and sacroiliac dysfunction. He did seem to make some improvement after last visit. Patient was changed some of her medications as well as different exercises. Patient states overall she is been doing relatively well. Has had a headache the last week care. Patient has though been doing more activity than usual. Patient is getting her standing desk neck week which she thinks also help most of this discomfort. Denies any new symptoms is worsening a previous symptoms.   Past medical history, social, surgical and family history all reviewed in electronic medical record.   Review of Systems: No headache, visual changes, nausea, vomiting, diarrhea, constipation, dizziness, abdominal pain, skin rash, fevers, chills, night sweats, weight loss, swollen lymph nodes, body aches, joint swelling, muscle aches, chest pain, shortness of breath, mood changes.   Objective Blood pressure 114/72, pulse 88, height 5\' 2"  (1.575 m), weight 126 lb (57.153 kg), last menstrual period 11/24/2014, SpO2 98 %.  General: No apparent distress alert and oriented x3 mood and affect normal, dressed appropriately.  HEENT: Pupils equal, extraocular movements intact  Respiratory: Patient's speak in full sentences and does not appear short of breath  Cardiovascular: No lower extremity edema, non tender, no erythema  Skin: Warm dry intact with no signs of infection or rash on extremities or on axial skeleton.  Abdomen: Soft nontender  Neuro: Cranial nerves II through XII are intact, neurovascularly intact in all extremities with 2+ DTRs and 2+ pulses.  Lymph: No lymphadenopathy of posterior or anterior cervical chain or  axillae bilaterally.  Gait normal with good balance and coordination.  MSK:  Non tender with full range of motion and good stability and symmetric strength and tone of shoulders, elbows, wrist, hip, knee and ankles bilaterally. Patient does have hyper mobility of the hands and wrists bilaterally   Neck:  improvement in the severity of the lordosis. No palpable stepoffs. Negative Spurling's maneuver. Full range of motion patient does have some mild tightness with rotation to the right Grip strength and sensation normal in bilateral hands Strength good C4 to T1 distribution No sensory change to C4 to T1 Negative Hoffman sign bilaterally Reflexes normal   OMT Physical Exam     Cervical  C2 flexed rotated and side bent left C4 flexed rotated and side bent right  Thoracic  left elevated first rib with T1 extended and rotated and side bent left T3 extended rotated and side bent to right T5 extended rotated and side bent left  Lumbar L2 flexed rotated and side bent left  Sacrum Left on left     Impression and Recommendations:     This case required medical decision making of moderate complexity.
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
## 2014-12-29 NOTE — Assessment & Plan Note (Signed)
Decision today to treat with OMT was based on Physical Exam  After verbal consent patient was treated with HVLA, ME techniques in cervical, rib, thoracic, lumbar and sacral areas  Patient tolerated the procedure well with improvement in symptoms  Patient given exercises, stretches and lifestyle modifications  See medications in patient instructions if given  Patient will follow up in 8 weeks

## 2014-12-29 NOTE — Assessment & Plan Note (Signed)
Patient is doing very well overall. We discussed icing regimen and home exercises. We discussed continuing to focus on the upper back. Patient will try to continue to be active and make these changes. I do think that changes and ergonomics at work will be helpful. Patient come back and see me again in 2 months for further evaluation and treatment.

## 2014-12-29 NOTE — Progress Notes (Signed)
Pre visit review using our clinic review tool, if applicable. No additional management support is needed unless otherwise documented below in the visit note. 

## 2015-02-23 ENCOUNTER — Ambulatory Visit (INDEPENDENT_AMBULATORY_CARE_PROVIDER_SITE_OTHER): Payer: BC Managed Care – PPO | Admitting: Family Medicine

## 2015-02-23 ENCOUNTER — Encounter: Payer: Self-pay | Admitting: Family Medicine

## 2015-02-23 VITALS — BP 110/74 | HR 82 | Ht 62.0 in | Wt 124.0 lb

## 2015-02-23 DIAGNOSIS — M9901 Segmental and somatic dysfunction of cervical region: Secondary | ICD-10-CM | POA: Diagnosis not present

## 2015-02-23 DIAGNOSIS — M9902 Segmental and somatic dysfunction of thoracic region: Secondary | ICD-10-CM

## 2015-02-23 DIAGNOSIS — M539 Dorsopathy, unspecified: Secondary | ICD-10-CM

## 2015-02-23 DIAGNOSIS — R51 Headache: Secondary | ICD-10-CM

## 2015-02-23 DIAGNOSIS — M533 Sacrococcygeal disorders, not elsewhere classified: Secondary | ICD-10-CM

## 2015-02-23 DIAGNOSIS — M9903 Segmental and somatic dysfunction of lumbar region: Secondary | ICD-10-CM | POA: Diagnosis not present

## 2015-02-23 DIAGNOSIS — M999 Biomechanical lesion, unspecified: Secondary | ICD-10-CM

## 2015-02-23 DIAGNOSIS — G4486 Cervicogenic headache: Secondary | ICD-10-CM

## 2015-02-23 NOTE — Assessment & Plan Note (Signed)
Patient states no headache since last exam. No change in management

## 2015-02-23 NOTE — Assessment & Plan Note (Signed)
Decision today to treat with OMT was based on Physical Exam  After verbal consent patient was treated with HVLA, ME techniques in cervical, rib, thoracic, lumbar and sacral areas  Patient tolerated the procedure well with improvement in symptoms  Patient given exercises, stretches and lifestyle modifications  See medications in patient instructions if given  Patient will follow up in 8-10 weeks              

## 2015-02-23 NOTE — Progress Notes (Signed)
Pre visit review using our clinic review tool, if applicable. No additional management support is needed unless otherwise documented below in the visit note. 

## 2015-02-23 NOTE — Progress Notes (Signed)
  Tawana ScaleZach Smith D.O.  [facility] [address] [place], [place] [address] Phone: [phone] Subjective:    CC: Headache follow-up   Lower back pain follow-up [id number]:Subjective Valerie MastersShannon L Ruiz is a 39 y.o. female coming in with complaint of headache. Patient was seen previously for a cervicogenic headache and sacroiliac dysfunction. Patient has been doing much better recently. Doing the exercises more. Patient has been on vacation and states that the OUT significantly. Does feel that there is some stress. Doing the upper back exercises and has not had any headaches since last follow-up.   Past medical history, social, surgical and family history all reviewed in electronic medical record.   Review of Systems: No headache, visual changes, nausea, vomiting, diarrhea, constipation, dizziness, abdominal pain, skin rash, fevers, chills, night sweats, weight loss, swollen lymph nodes, body aches, joint swelling, muscle aches, chest pain, shortness of breath, mood changes.   Objective Blood pressure 110/74, pulse 82, height 5\' 2"  (1.575 m), weight 124 lb (56.246 kg), SpO2 99 %.  General: No apparent distress alert and oriented x3 mood and affect normal, dressed appropriately.  HEENT: Pupils equal, extraocular movements intact  Respiratory: Patient's speak in full sentences and does not appear short of breath  Cardiovascular: No lower extremity edema, non tender, no erythema  Skin: Warm dry intact with no signs of infection or rash on extremities or on axial skeleton.  Abdomen: Soft nontender  Neuro: Cranial nerves II through XII are intact, neurovascularly intact in all extremities with 2+ DTRs and 2+ pulses.  Lymph: No lymphadenopathy of posterior or anterior cervical chain or axillae bilaterally.  Gait normal with good balance and coordination.  MSK:  Non tender with full range of motion and good stability and symmetric strength and tone of shoulders, elbows, wrist, hip, knee and ankles  bilaterally. Patient does have hyper mobility of the hands and wrists bilaterally   Neck:  improvement in the severity of the lordosis. No palpable stepoffs. Negative Spurling's maneuver. Full range of motion patient does have some mild tightness with rotation to the right Grip strength and sensation normal in bilateral hands Strength good C4 to T1 distribution No sensory change to C4 to T1 Negative Hoffman sign bilaterally Reflexes normal No change from previous exam  OMT Physical Exam     Cervical  C2 flexed rotated and side bent left C4 flexed rotated and side bent right  Thoracic  left elevated first rib  T3 extended rotated and side bent to right T7 extended rotated and side bent left  Lumbar L2 flexed rotated and side bent left L4 flexed rotated and side bent right  Sacrum Left on left     Impression and Recommendations:     This case required medical decision making of moderate complexity.

## 2015-02-23 NOTE — Patient Instructions (Addendum)
Good to see you Valerie Ruiz is your friend Stay active and do whatever you are doing because it is working See me again in 9-10 weeks.

## 2015-02-23 NOTE — Assessment & Plan Note (Signed)
She is doing fairly well overall. Encourage patient to continue to work on core strength. Patient has not been doing the home exercises as regularly. Does have muscle relaxers as needed for picture pain. Overall though patient continues to do well we will see her in 2-3 months for further evaluation and treatment.

## 2015-03-01 ENCOUNTER — Encounter: Payer: Self-pay | Admitting: Neurology

## 2015-03-01 ENCOUNTER — Ambulatory Visit (INDEPENDENT_AMBULATORY_CARE_PROVIDER_SITE_OTHER): Payer: BC Managed Care – PPO | Admitting: Neurology

## 2015-03-01 VITALS — BP 118/68 | HR 67 | Ht 62.0 in | Wt 126.0 lb

## 2015-03-01 DIAGNOSIS — R51 Headache: Secondary | ICD-10-CM

## 2015-03-01 DIAGNOSIS — G4486 Cervicogenic headache: Secondary | ICD-10-CM

## 2015-03-01 NOTE — Patient Instructions (Signed)
I am glad you are doing well.  Continue therapy with Dr. Katrinka BlazingSmith.  You may follow up as needed.

## 2015-03-01 NOTE — Progress Notes (Signed)
NEUROLOGY FOLLOW UP OFFICE NOTE  Valerie Ruiz [id number]  HISTORY OF PRESENT ILLNESS: Valerie Ruiz is a 39 year old right-handed woman with history of asthma, palpitations, gastroenteritis and chronic headaches who follows up for tension-type headaches.  Records reviewed.  UPDATE: She has been seeing Dr. Katrinka BlazingSmith for OMM about every 8 weeks.  Headaches are significantly improved.  She has a headache roughly every 4 to 5 weeks.  She hasn't needed the Flexeril in awhile.  HISTORY: Onset:  Off and on for several years.  This episode started about 4 weeks ago. Location:  Suboccipital region.  Initially a tender focal spot on the right side and radiated to front.  Now bi-suboccipital and non-radiating.  Some neck tightness.  No pain or numbness involving the arms or hands. Quality:  Dull, nagging Initial intensity:  4-5/10 Aura:  no Prodrome:  no Associated symptoms:  none Initial duration:  Throughout the day (may wake up with it or it can start later in the day); September a day Initial frequency:  daily; September 20 headache days/month; June twice a month Triggers/exacerbating factors:  Laying supine with head on pillow.  Not triggered by neck movement or change in position. Relieving factors:  none Activity:  Able to function  Past abortive therapy:  Cyclobenzaprine 5mg  (helpful), Robaxin 500mg  (not effective) Past preventative therapy:  Amitriptyline 10mg  daily (palpitations), topiramate (headache)  Family history of headache:  Mom (migraines)  She does have history of typical migraines associated with nausea, which are different than this headache  Past history:  She [facility] June [facility] for acute onset left hand and foot numbness.  Exam was normal and imaging was unremarkable.  08/21/11 MRI of the brain wo contrast: essentially normal with incidental left frontal parasagittal focus of gliosis of unclear  significance.  PAST MEDICAL HISTORY: Past Medical History  Diagnosis Date  . Allergic rhinitis   . Asthma   . Chronic headaches   . LLQ pain 09/10/2014  . Depression   . Glaucoma   . History of ovulatory pain 09/10/2014  . Preventative health care 12/24/2014    MEDICATIONS: Current Outpatient Prescriptions on File Prior to Visit  Medication Sig Dispense Refill  . cyclobenzaprine (FLEXERIL) 10 MG tablet Take 0.5-1tab TID prn headache 30 tablet 3  . metoprolol tartrate (LOPRESSOR) 25 MG tablet Take half tab twice daily 60 tablet 3   No current facility-administered medications on file prior to visit.    ALLERGIES: No Known Allergies  FAMILY HISTORY: Family History  Problem Relation Age of Onset  . Hyperthyroidism Mother   . Breast cancer Paternal Grandmother   . Cancer Paternal Grandmother     metastatic breast with mets to bone  . Gout Father   . Heart disease Maternal Grandmother [id number]    MI  . Stroke Maternal Grandfather     SOCIAL HISTORY: Social History   Social History  . Marital Status: Single    Spouse Name: N/A  . Number of Children: N/A  . Years of Education: N/A   Occupational History  . Not on file.   Social History Main Topics  . Smoking status: Never Smoker   . Smokeless tobacco: Never Used  . Alcohol Use: No  . Drug Use: No  . 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 Not  on file   Social History Narrative    REVIEW OF SYSTEMS: Constitutional: No fevers, chills, or sweats, no generalized fatigue, change in appetite Eyes: No visual changes, double vision, eye pain Ear, nose and throat: No hearing loss, ear pain, nasal congestion, sore throat Cardiovascular: No chest pain, palpitations Respiratory:  No shortness of breath at rest or with exertion, wheezes GastrointestinaI: No nausea, vomiting, diarrhea, abdominal  pain, fecal incontinence Genitourinary:  No dysuria, urinary retention or frequency Musculoskeletal:  No neck pain, back pain Integumentary: No rash, pruritus, skin lesions Neurological: as above Psychiatric: No depression, insomnia, anxiety Endocrine: No palpitations, fatigue, diaphoresis, mood swings, change in appetite, change in weight, increased thirst Hematologic/Lymphatic:  No anemia, purpura, petechiae. Allergic/Immunologic: no itchy/runny eyes, nasal congestion, recent allergic reactions, rashes  PHYSICAL EXAM: Filed Vitals:   03/01/15 0850  BP: 118/68  Pulse: 67   General: No acute distress.  Patient appears well-groomed.  normal body habitus. Head:  Normocephalic/atraumatic Eyes:  Fundoscopic exam unremarkable without vessel changes, exudates, hemorrhages or papilledema. Neck: supple, no paraspinal tenderness, full range of motion Heart:  Regular rate and rhythm Lungs:  Clear to auscultation bilaterally Back: No paraspinal tenderness Neurological Exam: alert and oriented to person, place, and time. Attention span and concentration intact, recent and remote memory intact, fund of knowledge intact.  Speech fluent and not dysarthric, language intact.  CN II-XII intact. Fundoscopic exam unremarkable without vessel changes, exudates, hemorrhages or papilledema.  Bulk and tone normal, muscle strength 5/5 throughout.  Sensation to light touch intact.  Deep tendon reflexes 2+ throughout.  Finger to nose and heel to shin testing intact.  Gait normal, Romberg negative.  IMPRESSION: Cervicogenic headaches, controlled  PLAN: Continue treatments with Dr. Katrinka Blazing Follow up with me as needed.  15 minutes spent face to face with patient, over 50% spent discussing management and update of headaches.  Shon Millet, DO  CC:  Danise Edge, MD

## 2015-05-02 ENCOUNTER — Telehealth: Payer: Self-pay | Admitting: Family Medicine

## 2015-05-02 NOTE — Telephone Encounter (Signed)
LM for pt to call and schedule flu shot or update records & need to reschedule appt in 12/2015

## 2015-05-04 ENCOUNTER — Ambulatory Visit: Payer: Self-pay | Admitting: Family Medicine

## 2015-05-11 ENCOUNTER — Ambulatory Visit (INDEPENDENT_AMBULATORY_CARE_PROVIDER_SITE_OTHER): Payer: BC Managed Care – PPO | Admitting: Family Medicine

## 2015-05-11 ENCOUNTER — Encounter: Payer: Self-pay | Admitting: Family Medicine

## 2015-05-11 VITALS — BP 114/72 | HR 77 | Ht 62.0 in | Wt 128.0 lb

## 2015-05-11 DIAGNOSIS — M999 Biomechanical lesion, unspecified: Secondary | ICD-10-CM

## 2015-05-11 DIAGNOSIS — M9901 Segmental and somatic dysfunction of cervical region: Secondary | ICD-10-CM | POA: Diagnosis not present

## 2015-05-11 DIAGNOSIS — R51 Headache: Secondary | ICD-10-CM

## 2015-05-11 DIAGNOSIS — M9902 Segmental and somatic dysfunction of thoracic region: Secondary | ICD-10-CM

## 2015-05-11 DIAGNOSIS — M539 Dorsopathy, unspecified: Secondary | ICD-10-CM | POA: Diagnosis not present

## 2015-05-11 DIAGNOSIS — M9903 Segmental and somatic dysfunction of lumbar region: Secondary | ICD-10-CM

## 2015-05-11 DIAGNOSIS — M533 Sacrococcygeal disorders, not elsewhere classified: Secondary | ICD-10-CM

## 2015-05-11 DIAGNOSIS — G4486 Cervicogenic headache: Secondary | ICD-10-CM

## 2015-05-11 NOTE — Assessment & Plan Note (Signed)
Extremely well controlled at this time. Extremity stable. Does have muscle relaxers if needed. Patient has any worsening symptoms to come back and see us again. Continues respond well to osteopathic manipulation.

## 2015-05-11 NOTE — Assessment & Plan Note (Signed)
Encouraged patient to continue to monitor closely. Patient will continue to work on core and hip abductors. Follow-up as needed.

## 2015-05-11 NOTE — Assessment & Plan Note (Signed)
Decision today to treat with OMT was based on Physical Exam  After verbal consent patient was treated with HVLA, ME techniques in cervical, rib, thoracic, lumbar and sacral areas  Patient tolerated the procedure well with improvement in symptoms  Patient given exercises, stretches and lifestyle modifications  See medications in patient instructions if given  Patient will follow up when necessary

## 2015-05-11 NOTE — Patient Instructions (Signed)
Always great to see you  Continue to focus on the upper back and core strengthening.  Ice when you need it.  I am so impressed.  See me when you need me.

## 2015-05-11 NOTE — Progress Notes (Signed)
Pre visit review using our clinic review tool, if applicable. No additional management support is needed unless otherwise documented below in the visit note. 

## 2015-05-11 NOTE — Progress Notes (Signed)
  Tawana ScaleZach Smith D.O. [facility] [address] [place], [place] [address] Phone: [phone] Subjective:    CC: Headache follow-up   Lower back pain follow-up ONG:[id number]:Subjective Valerie MastersShannon L Ruiz is a 40 y.o. female coming in with complaint of headache. Patient was seen previously for a cervicogenic headache and sacroiliac dysfunction. Patient is been doing well and has not been seen for 3 months. Patient does have muscle relaxer she occasionally uses. Patient states overall that she has not had been significant headaches. Very minimal headaches. Patient states low back has been doing very well. Working on a regular basis. Feeling good overall.   Past medical history, social, surgical and family history all reviewed in electronic medical record.   Review of Systems: No headache, visual changes, nausea, vomiting, diarrhea, constipation, dizziness, abdominal pain, skin rash, fevers, chills, night sweats, weight loss, swollen lymph nodes, body aches, joint swelling, muscle aches, chest pain, shortness of breath, mood changes.   Objective Blood pressure 114/72, pulse 77, height 5\' 2"  (1.575 m), weight 128 lb (58.06 kg), SpO2 99 %.  General: No apparent distress alert and oriented x3 mood and affect normal, dressed appropriately.  HEENT: Pupils equal, extraocular movements intact  Respiratory: Patient's speak in full sentences and does not appear short of breath  Cardiovascular: No lower extremity edema, non tender, no erythema  Skin: Warm dry intact with no signs of infection or rash on extremities or on axial skeleton.  Abdomen: Soft nontender  Neuro: Cranial nerves II through XII are intact, neurovascularly intact in all extremities with 2+ DTRs and 2+ pulses.  Lymph: No lymphadenopathy of posterior or anterior cervical chain or axillae bilaterally.  Gait normal with good balance and coordination.  MSK:  Non tender with full range of motion and good stability and symmetric strength and  tone of shoulders, elbows, wrist, hip, knee and ankles bilaterally. Patient does have hyper mobility of the hands and wrists bilaterally   Neck:  improvement in the severity of the lordosis. No palpable stepoffs. Negative Spurling's maneuver. Full range of motion patient does have some mild tightness with rotation to the right Grip strength and sensation normal in bilateral hands Strength good C4 to T1 distribution No sensory change to C4 to T1 Negative Hoffman sign bilaterally Reflexes normal No change from previous exam  OMT Physical Exam     Cervical  C2 flexed rotated and side bent left C6 flexed rotated and side bent right  Thoracic  left elevated first rib still present T5 extended rotated and side bent to right  Lumbar L2 flexed rotated and side bent left L4 flexed rotated and side bent right  Sacrum Left on left     Impression and Recommendations:     This case required medical decision making of moderate complexity.

## 2015-08-07 ENCOUNTER — Encounter: Payer: Self-pay | Admitting: Family Medicine

## 2015-08-07 ENCOUNTER — Other Ambulatory Visit: Payer: Self-pay | Admitting: Family Medicine

## 2015-08-07 MED ORDER — METOPROLOL TARTRATE 25 MG PO TABS: ORAL_TABLET | ORAL | Status: DC

## 2015-08-28 ENCOUNTER — Encounter: Payer: Self-pay | Admitting: Family Medicine

## 2015-08-28 ENCOUNTER — Ambulatory Visit (INDEPENDENT_AMBULATORY_CARE_PROVIDER_SITE_OTHER): Payer: BC Managed Care – PPO | Admitting: Family Medicine

## 2015-08-28 VITALS — BP 98/68 | HR 74 | Temp 98.1°F | Ht 62.0 in | Wt 125.0 lb

## 2015-08-28 DIAGNOSIS — J209 Acute bronchitis, unspecified: Secondary | ICD-10-CM | POA: Insufficient documentation

## 2015-08-28 HISTORY — DX: Acute bronchitis, unspecified: J20.9

## 2015-08-28 MED ORDER — AZITHROMYCIN 250 MG PO TABS: ORAL_TABLET | ORAL | Status: DC

## 2015-08-28 MED ORDER — HYDROCODONE-HOMATROPINE 5-1.5 MG/5ML PO SYRP: 5.0000 mL | ORAL_SOLUTION | Freq: Three times a day (TID) | ORAL | Status: DC | PRN

## 2015-08-28 NOTE — Patient Instructions (Signed)
Encouraged increased rest and hydration, add probiotics, zinc such as Coldeze or Xicam. Treat fevers as needed. Mucinex twice daily, 64 oz of clear fluids. Vitamin c 500 to 1000 mg daily, probiotics NOW company 10 strain probiotic 1 cap daily at [facility] or [email or web address].com. Elderberry liquid or caps, Aged or Black garlic daily  Sinusitis, Adult Sinusitis is redness, soreness, and inflammation of the paranasal sinuses. Paranasal sinuses are air pockets within the bones of your face. They are located beneath your eyes, in the middle of your forehead, and above your eyes. In healthy paranasal sinuses, mucus is able to drain out, and air is able to circulate through them by way of your nose. However, when your paranasal sinuses are inflamed, mucus and air can become trapped. This can allow bacteria and other germs to grow and cause infection. Sinusitis can develop quickly and last only a short time (acute) or continue over a long period (chronic). Sinusitis that lasts for more than 12 weeks is considered chronic. CAUSES Causes of sinusitis include:  Allergies.  Structural abnormalities, such as displacement of the cartilage that separates your nostrils (deviated septum), which can decrease the air flow through your nose and sinuses and affect sinus drainage.  Functional abnormalities, such as when the small hairs (cilia) that line your sinuses and help remove mucus do not work properly or are not present. SIGNS AND SYMPTOMS Symptoms of acute and chronic sinusitis are the same. The primary symptoms are pain and pressure around the affected sinuses. Other symptoms include:  Upper toothache.  Earache.  Headache.  Bad breath.  Decreased sense of smell and taste.  A cough, which worsens when you are lying flat.  Fatigue.  Fever.  Thick drainage from your nose, which often is green and may contain pus (purulent).  Swelling and warmth over the affected sinuses. DIAGNOSIS Your health care  provider will perform a physical exam. During your exam, your health care provider may perform any of the following to help determine if you have acute sinusitis or chronic sinusitis:  Look in your nose for signs of abnormal growths in your nostrils (nasal polyps).  Tap over the affected sinus to check for signs of infection.  View the inside of your sinuses using an imaging device that has a light attached (endoscope). If your health care provider suspects that you have chronic sinusitis, one or more of the following tests may be recommended:  Allergy tests.  Nasal culture. A sample of mucus is taken from your nose, sent to a lab, and screened for bacteria.  Nasal cytology. A sample of mucus is taken from your nose and examined by your health care provider to determine if your sinusitis is related to an allergy. TREATMENT Most cases of acute sinusitis are related to a viral infection and will resolve on their own within 10 days. Sometimes, medicines are prescribed to help relieve symptoms of both acute and chronic sinusitis. These may include pain medicines, decongestants, nasal steroid sprays, or saline sprays. However, for sinusitis related to a bacterial infection, your health care provider will prescribe antibiotic medicines. These are medicines that will help kill the bacteria causing the infection. Rarely, sinusitis is caused by a fungal infection. In these cases, your health care provider will prescribe antifungal medicine. For some cases of chronic sinusitis, surgery is needed. Generally, these are cases in which sinusitis recurs more than 3 times per year, despite other treatments. HOME CARE INSTRUCTIONS  Drink plenty of water. Water helps thin the mucus so  your sinuses can drain more easily.  Use a humidifier.  Inhale steam 3-4 times a day (for example, sit in the bathroom with the shower running).  Apply a warm, moist washcloth to your face 3-4 times a day, or as directed by  your health care provider.  Use saline nasal sprays to help moisten and clean your sinuses.  Take medicines only as directed by your health care provider.  If you were prescribed either an antibiotic or antifungal medicine, finish it all even if you start to feel better. SEEK IMMEDIATE MEDICAL CARE IF:  You have increasing pain or severe headaches.  You have nausea, vomiting, or drowsiness.  You have swelling around your face.  You have vision problems.  You have a stiff neck.  You have difficulty breathing.   This information is not intended to replace advice given to you by your health care provider. Make sure you discuss any questions you have with your health care provider.   Document Released: 02/17/2005 Document Revised: 03/10/2014 Document Reviewed: 03/04/2011 Elsevier Interactive Patient Education [employer] Elsevier Inc.

## 2015-08-28 NOTE — Progress Notes (Signed)
Pre visit review using our clinic review tool, if applicable. No additional management support is needed unless otherwise documented below in the visit note. 

## 2015-08-28 NOTE — Assessment & Plan Note (Signed)
With sinus symptoms as well. Encouraged increased rest and hydration, add probiotics, zinc such as Coldeze or Xicam. Treat fevers as needed. Zpak and hydromet prn. Probiotics, elderberry, vitamin c and garlic

## 2015-08-28 NOTE — Progress Notes (Signed)
Patient ID: Valerie MastersShannon L Farrington, female   DOB: 06-27-1975, 40 y.o.   MRN: [id number]   Subjective:    Patient ID: Valerie MastersShannon L Farrington, female    DOB: 06-27-1975, 40 y.o.   MRN: [id number]  Chief Complaint  Patient presents with  . Cough    HPI Patient is in today for respiratory symptoms x 3.5 weeks. She is struggling with nasal congestion, yellow rhinorrhea, cough keeping her up at night, productive of yellow phlegm. Some sob with coughing. No fever, chills, myalgias, headaches, ear pain or throat pain. Denies CP/palp/SOB/HA/fevers/GI or GU c/o. Taking meds as prescribed   Past Medical History  Diagnosis Date  . Allergic rhinitis   . Asthma   . Chronic headaches   . LLQ pain 09/10/2014  . Depression   . Glaucoma   . History of ovulatory pain 09/10/2014  . Preventative health care 12/24/2014  . Acute bronchitis 08/28/2015    Past Surgical History  Procedure Laterality Date  . Ankle surgery      Broken ankle    Family History  Problem Relation Age of Onset  . Hyperthyroidism Mother   . Breast cancer Paternal Grandmother   . Cancer Paternal Grandmother     metastatic breast with mets to bone  . Gout Father   . Heart disease Maternal Grandmother [phone]    MI  . Stroke Maternal Grandfather     Social History   Social History  . Marital Status: Single    Spouse Name: N/A  . Number of Children: N/A  . Years of Education: N/A   Occupational History  . Not on file.   Social History Main Topics  . Smoking status: Never Smoker   . Smokeless tobacco: Never Used  . Alcohol Use: No  . Drug Use: No  . 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 Not on file   Social History Narrative    Outpatient Prescriptions Prior to Visit  Medication Sig Dispense Refill  . metoprolol tartrate (LOPRESSOR) 25 MG tablet Take half tab twice daily 60 tablet 3    . cyclobenzaprine (FLEXERIL) 10 MG tablet Take 0.5-1tab TID prn headache (Patient not taking: Reported on 08/28/2015) 30 tablet 3   No facility-administered medications prior to visit.    No Known Allergies  Review of Systems  Constitutional: Negative for fever, chills and malaise/fatigue.  HENT: Positive for congestion. Negative for ear pain, hearing loss and sore throat.   Eyes: Negative for blurred vision.  Respiratory: Positive for cough and shortness of breath.   Cardiovascular: Negative for chest pain, palpitations and leg swelling.  Gastrointestinal: Negative for nausea, abdominal pain and blood in stool.  Genitourinary: Negative for dysuria and frequency.  Musculoskeletal: Negative for falls.  Skin: Negative for rash.  Neurological: Negative for dizziness, loss of consciousness, weakness and headaches.  Endo/Heme/Allergies: Negative for environmental allergies.  Psychiatric/Behavioral: Negative for depression. The patient is not nervous/anxious.        Objective:    Physical Exam  Constitutional: She is oriented to person, place, and time. She appears well-developed and well-nourished. No distress.  HENT:  Head: Normocephalic and atraumatic.  Nose: Nose normal.  Eyes: Right eye exhibits no discharge. Left eye exhibits no discharge.  Neck: Normal range of motion. Neck supple.  Cardiovascular: Normal rate and regular rhythm.   No murmur heard. Pulmonary/Chest: Effort normal and  breath sounds normal.  Abdominal: Soft. Bowel sounds are normal. There is no tenderness.  Musculoskeletal: She exhibits no edema.  Neurological: She is alert and oriented to person, place, and time.  Skin: Skin is warm and dry.  Psychiatric: She has a normal mood and affect.  Nursing note and vitals reviewed.   BP 98/68 mmHg  Pulse 74  Temp(Src) 98.1 F (36.7 C) (Oral)  Ht 5\' 2"  (1.575 m)  Wt 125 lb (56.7 kg)  BMI 22.86 kg/m2  SpO2 98% Wt Readings from Last 3 Encounters:  08/28/15 125  lb (56.7 kg)  05/11/15 128 lb (58.06 kg)  03/01/15 126 lb (57.153 kg)     Lab Results  Component Value Date   WBC 3.9* 12/15/2014   HGB 12.9 12/15/2014   HCT 38.0 12/15/2014   PLT 182.0 12/15/2014   GLUCOSE 90 12/29/2014   CHOL 142 12/15/2014   TRIG 38.0 12/15/2014   HDL 59.00 12/15/2014   LDLCALC 76 12/15/2014   ALT 13 12/29/2014   AST 19 12/29/2014   NA 141 12/29/2014   K 4.7 12/29/2014   CL 105 12/29/2014   CREATININE 0.69 12/29/2014   BUN 11 12/29/2014   CO2 28 12/29/2014   TSH 1.40 12/15/2014    Lab Results  Component Value Date   TSH 1.40 12/15/2014   Lab Results  Component Value Date   WBC 3.9* 12/15/2014   HGB 12.9 12/15/2014   HCT 38.0 12/15/2014   MCV 93.1 12/15/2014   PLT 182.0 12/15/2014   Lab Results  Component Value Date   NA 141 12/29/2014   K 4.7 12/29/2014   CO2 28 12/29/2014   GLUCOSE 90 12/29/2014   BUN 11 12/29/2014   CREATININE 0.69 12/29/2014   BILITOT 0.9 12/29/2014   ALKPHOS 48 12/29/2014   AST 19 12/29/2014   ALT 13 12/29/2014   PROT 7.5 12/29/2014   ALBUMIN 4.3 12/29/2014   CALCIUM 9.6 12/29/2014   GFR 101.28 12/29/2014   Lab Results  Component Value Date   CHOL 142 12/15/2014   Lab Results  Component Value Date   HDL 59.00 12/15/2014   Lab Results  Component Value Date   LDLCALC 76 12/15/2014   Lab Results  Component Value Date   TRIG 38.0 12/15/2014   Lab Results  Component Value Date   CHOLHDL 2 12/15/2014   No results found for: HGBA1C     Assessment & Plan:   Problem List Items Addressed This Visit    Acute bronchitis - Primary    With sinus symptoms as well. Encouraged increased rest and hydration, add probiotics, zinc such as Coldeze or Xicam. Treat fevers as needed. Zpak and hydromet prn. Probiotics, elderberry, vitamin c and garlic         I am having Ms. Farrington start on azithromycin and HYDROcodone-homatropine. I am also having her maintain her cyclobenzaprine and metoprolol  tartrate.  Meds ordered this encounter  Medications  . azithromycin (ZITHROMAX) 250 MG tablet    Sig: 2 tabs po once then 1 tab po daily x 4 days    Dispense:  6 tablet    Refill:  0  . HYDROcodone-homatropine (HYCODAN) 5-1.5 MG/5ML syrup    Sig: Take 5 mLs by mouth every 8 (eight) hours as needed for cough.    Dispense:  120 mL    Refill:  0     Danise EdgeBLYTH, STACEY, MD

## 2015-10-31 ENCOUNTER — Ambulatory Visit (INDEPENDENT_AMBULATORY_CARE_PROVIDER_SITE_OTHER): Payer: BC Managed Care – PPO | Admitting: Family Medicine

## 2015-10-31 ENCOUNTER — Encounter: Payer: Self-pay | Admitting: Family Medicine

## 2015-10-31 VITALS — BP 113/62 | HR 76 | Ht 62.0 in | Wt 132.0 lb

## 2015-10-31 DIAGNOSIS — Z01419 Encounter for gynecological examination (general) (routine) without abnormal findings: Secondary | ICD-10-CM

## 2015-10-31 DIAGNOSIS — Z124 Encounter for screening for malignant neoplasm of cervix: Secondary | ICD-10-CM

## 2015-10-31 NOTE — Progress Notes (Signed)
Subjective:     Valerie Ruiz is a 40 y.o. female and is here for a comprehensive physical exam. The patient reports no problems.  Social History   Social History  . Marital status: Single    Spouse name: N/A  . Number of children: N/A  . Years of education: N/A   Occupational History  . Not on file.   Social History Main Topics  . Smoking status: Never Smoker  . Smokeless tobacco: Never Used  . Alcohol use No  . Drug use: No  . 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 Not on file   Social History Narrative  . No narrative on file   Health Maintenance  Topic Date Due  . HIV Screening  02/21/1992  . MAMMOGRAM  02/21/1995  . TETANUS/TDAP  02/21/1996  . PAP SMEAR  04/25/2013  . INFLUENZA VACCINE  10/02/2015    The following portions of the patient's history were reviewed and updated as appropriate: allergies, current medications, past family history, past medical history, past social history, past surgical history and problem list.  Review of Systems Pertinent items are noted in HPI.   Objective:      General Appearance:    Alert, cooperative, no distress, appears stated age  Head:    Normocephalic, without obvious abnormality, atraumatic  Eyes:    PERRL, conjunctiva/corneas clear, EOM's intact, fundi    benign, both eyes  Throat:   Lips, mucosa, and tongue normal; teeth and gums normal  Neck:   Supple, symmetrical, trachea midline, no adenopathy;    thyroid:  no enlargement/tenderness/nodules; no carotid   bruit or JVD  Back:     Symmetric, no curvature, ROM normal, no CVA tenderness  Lungs:     Clear to auscultation bilaterally, respirations unlabored  Chest Wall:    No tenderness or deformity   Heart:    Regular rate and rhythm, S1 and S2 normal, no murmur, rub   or gallop  Breast Exam:    No tenderness, masses, or nipple  abnormality  Abdomen:     Soft, non-tender, bowel sounds active all four quadrants,    no masses, no organomegaly  Genitalia:    Normal female without lesion, discharge or tenderness.         Vaginal rugae normal.  Cervix normal in appearance.  Uterus   normal in size.  Adnexa normal, no masses or fullness   palpated.  Extremities:   Extremities normal, atraumatic, no cyanosis or edema  Pulses:   2+ and symmetric all extremities  Skin:   Skin color, texture, turgor normal, no rashes or lesions  Lymph nodes:   Cervical, supraclavicular, and axillary nodes normal  Neurologic:   CNII-XII intact, normal strength, sensation and reflexes    throughout    Assessment:    Healthy female exam.      Plan:    Pap done Patient will get yearly blood work with PCP Discussed breast exams, appropriate diet, weightbearing exercise.  See After Visit Summary for Counseling Recommendations

## 2015-10-31 NOTE — Patient Instructions (Signed)
Health Maintenance, Female Adopting a healthy lifestyle and getting preventive care can go a long way to promote health and wellness. Talk with your health care provider about what schedule of regular examinations is right for you. This is a good chance for you to check in with your provider about disease prevention and staying healthy. In between checkups, there are plenty of things you can do on your own. Experts have done a lot of research about which lifestyle changes and preventive measures are most likely to keep you healthy. Ask your health care provider for more information. WEIGHT AND DIET  Eat a healthy diet  Be sure to include plenty of vegetables, fruits, low-fat dairy products, and lean protein.  Do not eat a lot of foods high in solid fats, added sugars, or salt.  Get regular exercise. This is one of the most important things you can do for your health.  Most adults should exercise for at least 150 minutes each week. The exercise should increase your heart rate and make you sweat (moderate-intensity exercise).  Most adults should also do strengthening exercises at least twice a week. This is in addition to the moderate-intensity exercise.  Maintain a healthy weight  Body mass index (BMI) is a measurement that can be used to identify possible weight problems. It estimates body fat based on height and weight. Your health care provider can help determine your BMI and help you achieve or maintain a healthy weight.  For females 20 years of age and older:   A BMI below 18.5 is considered underweight.  A BMI of 18.5 to 24.9 is normal.  A BMI of 25 to 29.9 is considered overweight.  A BMI of 30 and above is considered obese.  Watch levels of cholesterol and blood lipids  You should start having your blood tested for lipids and cholesterol at 39 years of age, then have this test every 5 years.  You may need to have your cholesterol levels checked more often if:  Your lipid  or cholesterol levels are high.  You are older than 39 years of age.  You are at high risk for heart disease.  CANCER SCREENING   Lung Cancer  Lung cancer screening is recommended for adults 55-80 years old who are at high risk for lung cancer because of a history of smoking.  A yearly low-dose CT scan of the lungs is recommended for people who:  Currently smoke.  Have quit within the past 15 years.  Have at least a 30-pack-year history of smoking. A pack year is smoking an average of one pack of cigarettes a day for 1 year.  Yearly screening should continue until it has been 15 years since you quit.  Yearly screening should stop if you develop a health problem that would prevent you from having lung cancer treatment.  Breast Cancer  Practice breast self-awareness. This means understanding how your breasts normally appear and feel.  It also means doing regular breast self-exams. Let your health care provider know about any changes, no matter how small.  If you are in your 20s or 30s, you should have a clinical breast exam (CBE) by a health care provider every 1-3 years as part of a regular health exam.  If you are 40 or older, have a CBE every year. Also consider having a breast X-ray (mammogram) every year.  If you have a family history of breast cancer, talk to your health care provider about genetic screening.  If you   are at high risk for breast cancer, talk to your health care provider about having an MRI and a mammogram every year.  Breast cancer gene (BRCA) assessment is recommended for women who have family members with BRCA-related cancers. BRCA-related cancers include:  Breast.  Ovarian.  Tubal.  Peritoneal cancers.  Results of the assessment will determine the need for genetic counseling and BRCA1 and BRCA2 testing. Cervical Cancer Your health care provider may recommend that you be screened regularly for cancer of the pelvic organs (ovaries, uterus, and  vagina). This screening involves a pelvic examination, including checking for microscopic changes to the surface of your cervix (Pap test). You may be encouraged to have this screening done every 3 years, beginning at age 21.  For women ages 30-65, health care providers may recommend pelvic exams and Pap testing every 3 years, or they may recommend the Pap and pelvic exam, combined with testing for human papilloma virus (HPV), every 5 years. Some types of HPV increase your risk of cervical cancer. Testing for HPV may also be done on women of any age with unclear Pap test results.  Other health care providers may not recommend any screening for nonpregnant women who are considered low risk for pelvic cancer and who do not have symptoms. Ask your health care provider if a screening pelvic exam is right for you.  If you have had past treatment for cervical cancer or a condition that could lead to cancer, you need Pap tests and screening for cancer for at least 20 years after your treatment. If Pap tests have been discontinued, your risk factors (such as having a new sexual partner) need to be reassessed to determine if screening should resume. Some women have medical problems that increase the chance of getting cervical cancer. In these cases, your health care provider may recommend more frequent screening and Pap tests. Colorectal Cancer  This type of cancer can be detected and often prevented.  Routine colorectal cancer screening usually begins at 39 years of age and continues through 39 years of age.  Your health care provider may recommend screening at an earlier age if you have risk factors for colon cancer.  Your health care provider may also recommend using home test kits to check for hidden blood in the stool.  A small camera at the end of a tube can be used to examine your colon directly (sigmoidoscopy or colonoscopy). This is done to check for the earliest forms of colorectal  cancer.  Routine screening usually begins at age 50.  Direct examination of the colon should be repeated every 5-10 years through 39 years of age. However, you may need to be screened more often if early forms of precancerous polyps or small growths are found. Skin Cancer  Check your skin from head to toe regularly.  Tell your health care provider about any new moles or changes in moles, especially if there is a change in a mole's shape or color.  Also tell your health care provider if you have a mole that is larger than the size of a pencil eraser.  Always use sunscreen. Apply sunscreen liberally and repeatedly throughout the day.  Protect yourself by wearing long sleeves, pants, a wide-brimmed hat, and sunglasses whenever you are outside. HEART DISEASE, DIABETES, AND HIGH BLOOD PRESSURE   High blood pressure causes heart disease and increases the risk of stroke. High blood pressure is more likely to develop in:  People who have blood pressure in the high end   of the normal range (130-139/85-89 mm Hg).  People who are overweight or obese.  People who are African American.  If you are 38-23 years of age, have your blood pressure checked every 3-5 years. If you are 61 years of age or older, have your blood pressure checked every year. You should have your blood pressure measured twice--once when you are at a hospital or clinic, and once when you are not at a hospital or clinic. Record the average of the two measurements. To check your blood pressure when you are not at a hospital or clinic, you can use:  An automated blood pressure machine at a pharmacy.  A home blood pressure monitor.  If you are between 45 years and 39 years old, ask your health care provider if you should take aspirin to prevent strokes.  Have regular diabetes screenings. This involves taking a blood sample to check your fasting blood sugar level.  If you are at a normal weight and have a low risk for diabetes,  have this test once every three years after 39 years of age.  If you are overweight and have a high risk for diabetes, consider being tested at a younger age or more often. PREVENTING INFECTION  Hepatitis B  If you have a higher risk for hepatitis B, you should be screened for this virus. You are considered at high risk for hepatitis B if:  You were born in a country where hepatitis B is common. Ask your health care provider which countries are considered high risk.  Your parents were born in a high-risk country, and you have not been immunized against hepatitis B (hepatitis B vaccine).  You have HIV or AIDS.  You use needles to inject street drugs.  You live with someone who has hepatitis B.  You have had sex with someone who has hepatitis B.  You get hemodialysis treatment.  You take certain medicines for conditions, including cancer, organ transplantation, and autoimmune conditions. Hepatitis C  Blood testing is recommended for:  Everyone born from [birth year] through 1964.  Anyone with known risk factors for hepatitis C. Sexually transmitted infections (STIs)  You should be screened for sexually transmitted infections (STIs) including gonorrhea and chlamydia if:  You are sexually active and are younger than 39 years of age.  You are older than 39 years of age and your health care provider tells you that you are at risk for this type of infection.  Your sexual activity has changed since you were last screened and you are at an increased risk for chlamydia or gonorrhea. Ask your health care provider if you are at risk.  If you do not have HIV, but are at risk, it may be recommended that you take a prescription medicine daily to prevent HIV infection. This is called pre-exposure prophylaxis (PrEP). You are considered at risk if:  You are sexually active and do not regularly use condoms or know the HIV status of your partner(s).  You take drugs by injection.  You are sexually  active with a partner who has HIV. Talk with your health care provider about whether you are at high risk of being infected with HIV. If you choose to begin PrEP, you should first be tested for HIV. You should then be tested every 3 months for as long as you are taking PrEP.  PREGNANCY   If you are premenopausal and you may become pregnant, ask your health care provider about preconception counseling.  If you may  become pregnant, take 400 to 800 micrograms (mcg) of folic acid every day.  If you want to prevent pregnancy, talk to your health care provider about birth control (contraception). OSTEOPOROSIS AND MENOPAUSE   Osteoporosis is a disease in which the bones lose minerals and strength with aging. This can result in serious bone fractures. Your risk for osteoporosis can be identified using a bone density scan.  If you are 61 years of age or older, or if you are at risk for osteoporosis and fractures, ask your health care provider if you should be screened.  Ask your health care provider whether you should take a calcium or vitamin D supplement to lower your risk for osteoporosis.  Menopause may have certain physical symptoms and risks.  Hormone replacement therapy may reduce some of these symptoms and risks. Talk to your health care provider about whether hormone replacement therapy is right for you.  HOME CARE INSTRUCTIONS   Schedule regular health, dental, and eye exams.  Stay current with your immunizations.   Do not use any tobacco products including cigarettes, chewing tobacco, or electronic cigarettes.  If you are pregnant, do not drink alcohol.  If you are breastfeeding, limit how much and how often you drink alcohol.  Limit alcohol intake to no more than 1 drink per day for nonpregnant women. One drink equals 12 ounces of beer, 5 ounces of wine, or 1 ounces of hard liquor.  Do not use street drugs.  Do not share needles.  Ask your health care provider for help if  you need support or information about quitting drugs.  Tell your health care provider if you often feel depressed.  Tell your health care provider if you have ever been abused or do not feel safe at home.   This information is not intended to replace advice given to you by your health care provider. Make sure you discuss any questions you have with your health care provider.   Document Released: 09/02/2010 Document Revised: 03/10/2014 Document Reviewed: 01/19/2013 Elsevier Interactive Patient Education [employer].

## 2015-11-01 LAB — CYTOLOGY - PAP

## 2015-11-03 ENCOUNTER — Encounter: Payer: Self-pay | Admitting: Family Medicine

## 2015-11-15 ENCOUNTER — Other Ambulatory Visit (HOSPITAL_COMMUNITY)
Admission: RE | Admit: 2015-11-15 | Discharge: 2015-11-15 | Disposition: A | Discharge status: Home / Self Care | Payer: BC Managed Care – PPO | Source: Ambulatory Visit | Attending: Family Medicine | Admitting: Family Medicine

## 2015-11-15 ENCOUNTER — Ambulatory Visit (INDEPENDENT_AMBULATORY_CARE_PROVIDER_SITE_OTHER): Payer: BC Managed Care – PPO | Admitting: Family Medicine

## 2015-11-15 ENCOUNTER — Encounter: Payer: Self-pay | Admitting: Family Medicine

## 2015-11-15 VITALS — BP 115/71 | HR 76 | Ht 62.0 in | Wt 123.0 lb

## 2015-11-15 DIAGNOSIS — Z01812 Encounter for preprocedural laboratory examination: Secondary | ICD-10-CM

## 2015-11-15 DIAGNOSIS — R87610 Atypical squamous cells of undetermined significance on cytologic smear of cervix (ASC-US): Secondary | ICD-10-CM

## 2015-11-15 DIAGNOSIS — R87810 Cervical high risk human papillomavirus (HPV) DNA test positive: Secondary | ICD-10-CM

## 2015-11-15 DIAGNOSIS — Z3202 Encounter for pregnancy test, result negative: Secondary | ICD-10-CM | POA: Diagnosis not present

## 2015-11-15 DIAGNOSIS — N870 Mild cervical dysplasia: Secondary | ICD-10-CM | POA: Diagnosis not present

## 2015-11-15 LAB — POCT URINE PREGNANCY: Preg Test, Ur: NEGATIVE

## 2015-11-15 NOTE — Addendum Note (Signed)
Addended by: Anell BarrHOWARD, JENNIFER L on: 11/15/2015 11:19 AM   Modules accepted: Orders

## 2015-11-15 NOTE — Progress Notes (Signed)
Colposcopy Note:  PAP: ASCUS with + HR HPV  Patient given informed consent, signed copy in the chart, time out was performed.  Placed in lithotomy position. Cervix viewed with speculum and colposcope after application of acetic acid.   Colposcopy adequate?  yes Acetowhite lesions? 2 o'clock Punctation? no Mosaicism?  no Abnormal vasculature?  no Biopsies? 2 o'clock ECC? yes   Patient was given post procedure instructions.  Will call patient with results.

## 2015-11-15 NOTE — Patient Instructions (Signed)

## 2015-11-21 ENCOUNTER — Encounter: Payer: Self-pay | Admitting: Family Medicine

## 2015-11-21 DIAGNOSIS — N871 Moderate cervical dysplasia: Secondary | ICD-10-CM | POA: Insufficient documentation

## 2015-11-23 ENCOUNTER — Encounter: Payer: Self-pay | Admitting: Family Medicine

## 2015-11-23 ENCOUNTER — Ambulatory Visit (INDEPENDENT_AMBULATORY_CARE_PROVIDER_SITE_OTHER): Payer: BC Managed Care – PPO | Admitting: Family Medicine

## 2015-11-23 VITALS — BP 102/62 | HR 66 | Temp 97.9°F | Ht 62.0 in | Wt 131.5 lb

## 2015-11-23 DIAGNOSIS — D72819 Decreased white blood cell count, unspecified: Secondary | ICD-10-CM | POA: Diagnosis not present

## 2015-11-23 DIAGNOSIS — M9902 Segmental and somatic dysfunction of thoracic region: Secondary | ICD-10-CM

## 2015-11-23 DIAGNOSIS — Z23 Encounter for immunization: Secondary | ICD-10-CM | POA: Diagnosis not present

## 2015-11-23 DIAGNOSIS — Z0000 Encounter for general adult medical examination without abnormal findings: Secondary | ICD-10-CM

## 2015-11-23 DIAGNOSIS — N870 Mild cervical dysplasia: Secondary | ICD-10-CM

## 2015-11-23 DIAGNOSIS — M999 Biomechanical lesion, unspecified: Secondary | ICD-10-CM

## 2015-11-23 LAB — CBC WITH DIFFERENTIAL/PLATELET
BASOS PCT: 0.6 % (ref 0.0–3.0)
Basophils Absolute: 0 10*3/uL (ref 0.0–0.1)
EOS ABS: 0.1 10*3/uL (ref 0.0–0.7)
EOS PCT: 1.6 % (ref 0.0–5.0)
HEMATOCRIT: 33.8 % — AB (ref 36.0–46.0)
Hemoglobin: 11.4 g/dL — ABNORMAL LOW (ref 12.0–15.0)
LYMPHS PCT: 31.1 % (ref 12.0–46.0)
Lymphs Abs: 1 10*3/uL (ref 0.7–4.0)
MCHC: 33.8 g/dL (ref 30.0–36.0)
MCV: 86.1 fl (ref 78.0–100.0)
MONOS PCT: 13.6 % — AB (ref 3.0–12.0)
Monocytes Absolute: 0.5 10*3/uL (ref 0.1–1.0)
NEUTROS ABS: 1.8 10*3/uL (ref 1.4–7.7)
Neutrophils Relative %: 53.1 % (ref 43.0–77.0)
PLATELETS: 188 10*3/uL (ref 150.0–400.0)
RBC: 3.92 Mil/uL (ref 3.87–5.11)
RDW: 15.7 % — AB (ref 11.5–15.5)
WBC: 3.4 10*3/uL — ABNORMAL LOW (ref 4.0–10.5)

## 2015-11-23 NOTE — Assessment & Plan Note (Signed)
Repeat cbc with diff today

## 2015-11-23 NOTE — Addendum Note (Signed)
Addended by: Scharlene GlossEWING, Dez Stauffer B on: 11/23/2015 09:44 AM   Modules accepted: Orders

## 2015-11-23 NOTE — Progress Notes (Signed)
Patient ID: Valerie Ruiz, female   DOB: 1976-01-24, 40 y.o.   MRN: [id number]   Subjective:    Patient ID: Valerie Ruiz, female    DOB: 06/25/1975, 40 y.o.   MRN: [id number]  Chief Complaint  Patient presents with  . Annual Exam    HPI Patient is in today for annual preventative exam she is feeling well today. No recent illness or hospitalization. Back pain is resolved with increased exercise and improved diet. 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 Denies CP/palp/SOB/HA/congestion/fevers/GI or GU c/o. Taking meds as prescribed  Past Medical History:  Diagnosis Date  . Acute bronchitis 08/28/2015  . Allergic rhinitis   . Asthma   . Chronic headaches   . Depression   . Glaucoma   . History of ovulatory pain 09/10/2014  . LLQ pain 09/10/2014  . Preventative health care 12/24/2014    Past Surgical History:  Procedure Laterality Date  . ANKLE SURGERY     Broken ankle    Family History  Problem Relation Age of Onset  . Hyperthyroidism Mother   . Breast cancer Paternal Grandmother   . Cancer Paternal Grandmother     metastatic breast with mets to bone  . Gout Father   . Heart disease Maternal Grandmother 32    MI  . Stroke Maternal Grandfather     Social History   Social History  . Marital status: Single    Spouse name: N/A  . Number of children: N/A  . Years of education: N/A   Occupational History  . Not on file.   Social History Main Topics  . Smoking status: Never Smoker  . Smokeless tobacco: Never Used  . Alcohol use No  . Drug use: No  . Sexual activity: Yes    Partners: Female     Comment: lives with spouse, works as Designer, industrial/product support at [employer]    Other Topics Concern  . Not on file   Social History Narrative  . No narrative on file    Outpatient Medications Prior to Visit  Medication Sig Dispense Refill  . metoprolol tartrate (LOPRESSOR) 25 MG tablet Take half tab twice daily 60  tablet 3  . azithromycin (ZITHROMAX) 250 MG tablet 2 tabs po once then 1 tab po daily x 4 days (Patient not taking: Reported on 11/15/2015) 6 tablet 0  . cyclobenzaprine (FLEXERIL) 10 MG tablet Take 0.5-1tab TID prn headache (Patient not taking: Reported on 11/15/2015) 30 tablet 3  . HYDROcodone-homatropine (HYCODAN) 5-1.5 MG/5ML syrup Take 5 mLs by mouth every 8 (eight) hours as needed for cough. (Patient not taking: Reported on 11/15/2015) 120 mL 0   No facility-administered medications prior to visit.     No Known Allergies  Review of Systems  Constitutional: Negative for chills, fever and malaise/fatigue.  HENT: Negative for congestion and hearing loss.   Eyes: Negative for discharge.  Respiratory: Negative for cough, sputum production and shortness of breath.   Cardiovascular: Negative for chest pain, palpitations and leg swelling.  Gastrointestinal: Negative for abdominal pain, blood in stool, constipation, diarrhea, heartburn, nausea and vomiting.  Genitourinary: Negative for dysuria, frequency, hematuria and urgency.  Musculoskeletal: Negative for back pain, falls and myalgias.  Skin: Negative for rash.  Neurological: Negative for dizziness, sensory change, loss of consciousness, weakness and headaches.  Endo/Heme/Allergies: Negative for environmental allergies. Does not bruise/bleed easily.  Psychiatric/Behavioral: Negative for depression and suicidal ideas. The patient is not nervous/anxious and does not have insomnia.  Objective:    Physical Exam  Constitutional: She is oriented to person, place, and time. She appears well-developed and well-nourished. No distress.  HENT:  Head: Normocephalic and atraumatic.  Eyes: Conjunctivae are normal.  Neck: Neck supple. No thyromegaly present.  Cardiovascular: Normal rate, regular rhythm and normal heart sounds.   No murmur heard. Pulmonary/Chest: Effort normal and breath sounds normal. No respiratory distress.  Abdominal:  Soft. Bowel sounds are normal. She exhibits no distension and no mass. There is no tenderness.  Genitourinary: Rectal exam shows guaiac negative stool.  Musculoskeletal: She exhibits no edema.  Lymphadenopathy:    She has no cervical adenopathy.  Neurological: She is alert and oriented to person, place, and time.  Skin: Skin is warm and dry.  Psychiatric: She has a normal mood and affect. Her behavior is normal.    BP 102/62 (BP Location: Left Arm, Patient Position: Sitting, Cuff Size: Normal)   Pulse 66   Temp 97.9 F (36.6 C) (Oral)   Ht 5\' 2"  (1.575 m)   Wt 131 lb 8 oz (59.6 kg)   LMP 11/10/2015 (Exact Date)   SpO2 98%   BMI 24.05 kg/m  Wt Readings from Last 3 Encounters:  11/23/15 131 lb 8 oz (59.6 kg)  11/15/15 123 lb (55.8 kg)  10/31/15 132 lb (59.9 kg)     Lab Results  Component Value Date   WBC 3.9 (L) 12/15/2014   HGB 12.9 12/15/2014   HCT 38.0 12/15/2014   PLT 182.0 12/15/2014   GLUCOSE 90 12/29/2014   CHOL 142 12/15/2014   TRIG 38.0 12/15/2014   HDL 59.00 12/15/2014   LDLCALC 76 12/15/2014   ALT 13 12/29/2014   AST 19 12/29/2014   NA 141 12/29/2014   K 4.7 12/29/2014   CL 105 12/29/2014   CREATININE 0.69 12/29/2014   BUN 11 12/29/2014   CO2 28 12/29/2014   TSH 1.40 12/15/2014    Lab Results  Component Value Date   TSH 1.40 12/15/2014   Lab Results  Component Value Date   WBC 3.9 (L) 12/15/2014   HGB 12.9 12/15/2014   HCT 38.0 12/15/2014   MCV 93.1 12/15/2014   PLT 182.0 12/15/2014   Lab Results  Component Value Date   NA 141 12/29/2014   K 4.7 12/29/2014   CO2 28 12/29/2014   GLUCOSE 90 12/29/2014   BUN 11 12/29/2014   CREATININE 0.69 12/29/2014   BILITOT 0.9 12/29/2014   ALKPHOS 48 12/29/2014   AST 19 12/29/2014   ALT 13 12/29/2014   PROT 7.5 12/29/2014   ALBUMIN 4.3 12/29/2014   CALCIUM 9.6 12/29/2014   GFR 101.28 12/29/2014   Lab Results  Component Value Date   CHOL 142 12/15/2014   Lab Results  Component Value Date    HDL 59.00 12/15/2014   Lab Results  Component Value Date   LDLCALC 76 12/15/2014   Lab Results  Component Value Date   TRIG 38.0 12/15/2014   Lab Results  Component Value Date   CHOLHDL 2 12/15/2014   No results found for: HGBA1C     Assessment & Plan:   Problem List Items Addressed This Visit    Nonallopathic lesion of thoracic region    Pain is much better with increased exercise and better diet      Preventative health care    Patient encouraged to maintain heart healthy diet, regular exercise, adequate sleep. Consider daily probiotics. Take medications as prescribed. Read the [facility] literature. Sees GYN in  [facility] saw them recently. Given and reviewed copy of ACP documents from [employer] of [place] and encouraged to complete and return. Tdap today      CIN I (cervical intraepithelial neoplasia I)    Has just undergone colposcopy and is going to have a repeat exam in one year      Leukopenia - Primary    Repeat cbc with diff today      Relevant Orders   CBC w/Diff    Other Visit Diagnoses   None.     I have discontinued Ms. Farrington's cyclobenzaprine, azithromycin, and HYDROcodone-homatropine. I am also having her maintain her metoprolol tartrate.  No orders of the defined types were placed in this encounter.    Danise EdgeBLYTH, Albaro Deviney, MD

## 2015-11-23 NOTE — Patient Instructions (Signed)
Preventive Care for Adults, Female A healthy lifestyle and preventive care can promote health and wellness. Preventive health guidelines for women include the following key practices.  A routine yearly physical is a good way to check with your health care provider about your health and preventive screening. It is a chance to share any concerns and updates on your health and to receive a thorough exam.  Visit your dentist for a routine exam and preventive care every 6 months. Brush your teeth twice a day and floss once a day. Good oral hygiene prevents tooth decay and gum disease.  The frequency of eye exams is based on your age, health, family medical history, use of contact lenses, and other factors. Follow your health care provider's recommendations for frequency of eye exams.  Eat a healthy diet. Foods like vegetables, fruits, whole grains, low-fat dairy products, and lean protein foods contain the nutrients you need without too many calories. Decrease your intake of foods high in solid fats, added sugars, and salt. Eat the right amount of calories for you.Get information about a proper diet from your health care provider, if necessary.  Regular physical exercise is one of the most important things you can do for your health. Most adults should get at least 150 minutes of moderate-intensity exercise (any activity that increases your heart rate and causes you to sweat) each week. In addition, most adults need muscle-strengthening exercises on 2 or more days a week.  Maintain a healthy weight. The body mass index (BMI) is a screening tool to identify possible weight problems. It provides an estimate of body fat based on height and weight. Your health care provider can find your BMI and can help you achieve or maintain a healthy weight.For adults 20 years and older:  A BMI below 18.5 is considered underweight.  A BMI of 18.5 to 24.9 is normal.  A BMI of 25 to 29.9 is considered overweight.  A  BMI of 30 and above is considered obese.  Maintain normal blood lipids and cholesterol levels by exercising and minimizing your intake of saturated fat. Eat a balanced diet with plenty of fruit and vegetables. Blood tests for lipids and cholesterol should begin at age 45 and be repeated every 5 years. If your lipid or cholesterol levels are high, you are over 50, or you are at high risk for heart disease, you may need your cholesterol levels checked more frequently.Ongoing high lipid and cholesterol levels should be treated with medicines if diet and exercise are not working.  If you smoke, find out from your health care provider how to quit. If you do not use tobacco, do not start.  Lung cancer screening is recommended for adults aged 45-80 years who are at high risk for developing lung cancer because of a history of smoking. A yearly low-dose CT scan of the lungs is recommended for people who have at least a 30-pack-year history of smoking and are a current smoker or have quit within the past 15 years. A pack year of smoking is smoking an average of 1 pack of cigarettes a day for 1 year (for example: 1 pack a day for 30 years or 2 packs a day for 15 years). Yearly screening should continue until the smoker has stopped smoking for at least 15 years. Yearly screening should be stopped for people who develop a health problem that would prevent them from having lung cancer treatment.  If you are pregnant, do not drink alcohol. If you are  breastfeeding, be very cautious about drinking alcohol. If you are not pregnant and choose to drink alcohol, do not have more than 1 drink per day. One drink is considered to be 12 ounces (355 mL) of beer, 5 ounces (148 mL) of wine, or 1.5 ounces (44 mL) of liquor.  Avoid use of street drugs. Do not share needles with anyone. Ask for help if you need support or instructions about stopping the use of drugs.  High blood pressure causes heart disease and increases the risk  of stroke. Your blood pressure should be checked at least every 1 to 2 years. Ongoing high blood pressure should be treated with medicines if weight loss and exercise do not work.  If you are 55-79 years old, ask your health care provider if you should take aspirin to prevent strokes.  Diabetes screening is done by taking a blood sample to check your blood glucose level after you have not eaten for a certain period of time (fasting). If you are not overweight and you do not have risk factors for diabetes, you should be screened once every 3 years starting at age 45. If you are overweight or obese and you are 40-70 years of age, you should be screened for diabetes every year as part of your cardiovascular risk assessment.  Breast cancer screening is essential preventive care for women. You should practice "breast self-awareness." This means understanding the normal appearance and feel of your breasts and may include breast self-examination. Any changes detected, no matter how small, should be reported to a health care provider. Women in their 20s and 30s should have a clinical breast exam (CBE) by a health care provider as part of a regular health exam every 1 to 3 years. After age 40, women should have a CBE every year. Starting at age 40, women should consider having a mammogram (breast X-ray test) every year. Women who have a family history of breast cancer should talk to their health care provider about genetic screening. Women at a high risk of breast cancer should talk to their health care providers about having an MRI and a mammogram every year.  Breast cancer gene (BRCA)-related cancer risk assessment is recommended for women who have family members with BRCA-related cancers. BRCA-related cancers include breast, ovarian, tubal, and peritoneal cancers. Having family members with these cancers may be associated with an increased risk for harmful changes (mutations) in the breast cancer genes BRCA1 and  BRCA2. Results of the assessment will determine the need for genetic counseling and BRCA1 and BRCA2 testing.  Your health care provider may recommend that you be screened regularly for cancer of the pelvic organs (ovaries, uterus, and vagina). This screening involves a pelvic examination, including checking for microscopic changes to the surface of your cervix (Pap test). You may be encouraged to have this screening done every 3 years, beginning at age 21.  For women ages 30-65, health care providers may recommend pelvic exams and Pap testing every 3 years, or they may recommend the Pap and pelvic exam, combined with testing for human papilloma virus (HPV), every 5 years. Some types of HPV increase your risk of cervical cancer. Testing for HPV may also be done on women of any age with unclear Pap test results.  Other health care providers may not recommend any screening for nonpregnant women who are considered low risk for pelvic cancer and who do not have symptoms. Ask your health care provider if a screening pelvic exam is right for   you.  If you have had past treatment for cervical cancer or a condition that could lead to cancer, you need Pap tests and screening for cancer for at least 20 years after your treatment. If Pap tests have been discontinued, your risk factors (such as having a new sexual partner) need to be reassessed to determine if screening should resume. Some women have medical problems that increase the chance of getting cervical cancer. In these cases, your health care provider may recommend more frequent screening and Pap tests.  Colorectal cancer can be detected and often prevented. Most routine colorectal cancer screening begins at the age of 50 years and continues through age 75 years. However, your health care provider may recommend screening at an earlier age if you have risk factors for colon cancer. On a yearly basis, your health care provider may provide home test kits to check  for hidden blood in the stool. Use of a small camera at the end of a tube, to directly examine the colon (sigmoidoscopy or colonoscopy), can detect the earliest forms of colorectal cancer. Talk to your health care provider about this at age 50, when routine screening begins. Direct exam of the colon should be repeated every 5-10 years through age 75 years, unless early forms of precancerous polyps or small growths are found.  People who are at an increased risk for hepatitis B should be screened for this virus. You are considered at high risk for hepatitis B if:  You were born in a country where hepatitis B occurs often. Talk with your health care provider about which countries are considered high risk.  Your parents were born in a high-risk country and you have not received a shot to protect against hepatitis B (hepatitis B vaccine).  You have HIV or AIDS.  You use needles to inject street drugs.  You live with, or have sex with, someone who has hepatitis B.  You get hemodialysis treatment.  You take certain medicines for conditions like cancer, organ transplantation, and autoimmune conditions.  Hepatitis C blood testing is recommended for all people born from 1945 through 1965 and any individual with known risks for hepatitis C.  Practice safe sex. Use condoms and avoid high-risk sexual practices to reduce the spread of sexually transmitted infections (STIs). STIs include gonorrhea, chlamydia, syphilis, trichomonas, herpes, HPV, and human immunodeficiency virus (HIV). Herpes, HIV, and HPV are viral illnesses that have no cure. They can result in disability, cancer, and death.  You should be screened for sexually transmitted illnesses (STIs) including gonorrhea and chlamydia if:  You are sexually active and are younger than 24 years.  You are older than 24 years and your health care provider tells you that you are at risk for this type of infection.  Your sexual activity has changed  since you were last screened and you are at an increased risk for chlamydia or gonorrhea. Ask your health care provider if you are at risk.  If you are at risk of being infected with HIV, it is recommended that you take a prescription medicine daily to prevent HIV infection. This is called preexposure prophylaxis (PrEP). You are considered at risk if:  You are sexually active and do not regularly use condoms or know the HIV status of your partner(s).  You take drugs by injection.  You are sexually active with a partner who has HIV.  Talk with your health care provider about whether you are at high risk of being infected with HIV. If   you choose to begin PrEP, you should first be tested for HIV. You should then be tested every 3 months for as long as you are taking PrEP.  Osteoporosis is a disease in which the bones lose minerals and strength with aging. This can result in serious bone fractures or breaks. The risk of osteoporosis can be identified using a bone density scan. Women ages 67 years and over and women at risk for fractures or osteoporosis should discuss screening with their health care providers. Ask your health care provider whether you should take a calcium supplement or vitamin D to reduce the rate of osteoporosis.  Menopause can be associated with physical symptoms and risks. Hormone replacement therapy is available to decrease symptoms and risks. You should talk to your health care provider about whether hormone replacement therapy is right for you.  Use sunscreen. Apply sunscreen liberally and repeatedly throughout the day. You should seek shade when your shadow is shorter than you. Protect yourself by wearing long sleeves, pants, a wide-brimmed hat, and sunglasses year round, whenever you are outdoors.  Once a month, do a whole body skin exam, using a mirror to look at the skin on your back. Tell your health care provider of new moles, moles that have irregular borders, moles that  are larger than a pencil eraser, or moles that have changed in shape or color.  Stay current with required vaccines (immunizations).  Influenza vaccine. All adults should be immunized every year.  Tetanus, diphtheria, and acellular pertussis (Td, Tdap) vaccine. Pregnant women should receive 1 dose of Tdap vaccine during each pregnancy. The dose should be obtained regardless of the length of time since the last dose. Immunization is preferred during the 27th-36th week of gestation. An adult who has not previously received Tdap or who does not know her vaccine status should receive 1 dose of Tdap. This initial dose should be followed by tetanus and diphtheria toxoids (Td) booster doses every 10 years. Adults with an unknown or incomplete history of completing a 3-dose immunization series with Td-containing vaccines should begin or complete a primary immunization series including a Tdap dose. Adults should receive a Td booster every 10 years.  Varicella vaccine. An adult without evidence of immunity to varicella should receive 2 doses or a second dose if she has previously received 1 dose. Pregnant females who do not have evidence of immunity should receive the first dose after pregnancy. This first dose should be obtained before leaving the health care facility. The second dose should be obtained 4-8 weeks after the first dose.  Human papillomavirus (HPV) vaccine. Females aged 13-26 years who have not received the vaccine previously should obtain the 3-dose series. The vaccine is not recommended for use in pregnant females. However, pregnancy testing is not needed before receiving a dose. If a female is found to be pregnant after receiving a dose, no treatment is needed. In that case, the remaining doses should be delayed until after the pregnancy. Immunization is recommended for any person with an immunocompromised condition through the age of 61 years if she did not get any or all doses earlier. During the  3-dose series, the second dose should be obtained 4-8 weeks after the first dose. The third dose should be obtained 24 weeks after the first dose and 16 weeks after the second dose.  Zoster vaccine. One dose is recommended for adults aged 30 years or older unless certain conditions are present.  Measles, mumps, and rubella (MMR) vaccine. Adults born  before 1957 generally are considered immune to measles and mumps. Adults born in 1957 or later should have 1 or more doses of MMR vaccine unless there is a contraindication to the vaccine or there is laboratory evidence of immunity to each of the three diseases. A routine second dose of MMR vaccine should be obtained at least 28 days after the first dose for students attending postsecondary schools, health care workers, or international travelers. People who received inactivated measles vaccine or an unknown type of measles vaccine during 1963-1967 should receive 2 doses of MMR vaccine. People who received inactivated mumps vaccine or an unknown type of mumps vaccine before 1979 and are at high risk for mumps infection should consider immunization with 2 doses of MMR vaccine. For females of childbearing age, rubella immunity should be determined. If there is no evidence of immunity, females who are not pregnant should be vaccinated. If there is no evidence of immunity, females who are pregnant should delay immunization until after pregnancy. Unvaccinated health care workers born before 1957 who lack laboratory evidence of measles, mumps, or rubella immunity or laboratory confirmation of disease should consider measles and mumps immunization with 2 doses of MMR vaccine or rubella immunization with 1 dose of MMR vaccine.  Pneumococcal 13-valent conjugate (PCV13) vaccine. When indicated, a person who is uncertain of his immunization history and has no record of immunization should receive the PCV13 vaccine. All adults 65 years of age and older should receive this  vaccine. An adult aged 19 years or older who has certain medical conditions and has not been previously immunized should receive 1 dose of PCV13 vaccine. This PCV13 should be followed with a dose of pneumococcal polysaccharide (PPSV23) vaccine. Adults who are at high risk for pneumococcal disease should obtain the PPSV23 vaccine at least 8 weeks after the dose of PCV13 vaccine. Adults older than 39 years of age who have normal immune system function should obtain the PPSV23 vaccine dose at least 1 year after the dose of PCV13 vaccine.  Pneumococcal polysaccharide (PPSV23) vaccine. When PCV13 is also indicated, PCV13 should be obtained first. All adults aged 65 years and older should be immunized. An adult younger than age 65 years who has certain medical conditions should be immunized. Any person who resides in a nursing home or long-term care facility should be immunized. An adult smoker should be immunized. People with an immunocompromised condition and certain other conditions should receive both PCV13 and PPSV23 vaccines. People with human immunodeficiency virus (HIV) infection should be immunized as soon as possible after diagnosis. Immunization during chemotherapy or radiation therapy should be avoided. Routine use of PPSV23 vaccine is not recommended for American Indians, [place] Natives, or people younger than 65 years unless there are medical conditions that require PPSV23 vaccine. When indicated, people who have unknown immunization and have no record of immunization should receive PPSV23 vaccine. One-time revaccination 5 years after the first dose of PPSV23 is recommended for people aged 19-64 years who have chronic kidney failure, nephrotic syndrome, asplenia, or immunocompromised conditions. People who received 1-2 doses of PPSV23 before age 65 years should receive another dose of PPSV23 vaccine at age 65 years or later if at least 5 years have passed since the previous dose. Doses of PPSV23 are not  needed for people immunized with PPSV23 at or after age 65 years.  Meningococcal vaccine. Adults with asplenia or persistent complement component deficiencies should receive 2 doses of quadrivalent meningococcal conjugate (MenACWY-D) vaccine. The doses should be obtained   at least 2 months apart. Microbiologists working with certain meningococcal bacteria, [place] recruits, people at risk during an outbreak, and people who travel to or live in countries with a high rate of meningitis should be immunized. A first-year college student up through age 34 years who is living in a residence [place] should receive a dose if she did not receive a dose on or after her 16th birthday. Adults who have certain high-risk conditions should receive one or more doses of vaccine.  Hepatitis A vaccine. Adults who wish to be protected from this disease, have certain high-risk conditions, work with hepatitis A-infected animals, work in hepatitis A research labs, or travel to or work in countries with a high rate of hepatitis A should be immunized. Adults who were previously unvaccinated and who anticipate close contact with an international adoptee during the first 60 days after arrival in the [place] from a country with a high rate of hepatitis A should be immunized.  Hepatitis B vaccine. Adults who wish to be protected from this disease, have certain high-risk conditions, may be exposed to blood or other infectious body fluids, are household contacts or sex partners of hepatitis B positive people, are clients or workers in certain care facilities, or travel to or work in countries with a high rate of hepatitis B should be immunized.  Haemophilus influenzae type b (Hib) vaccine. A previously unvaccinated person with asplenia or sickle cell disease or having a scheduled splenectomy should receive 1 dose of Hib vaccine. Regardless of previous immunization, a recipient of a hematopoietic stem cell transplant should receive a  3-dose series 6-12 months after her successful transplant. Hib vaccine is not recommended for adults with HIV infection. Preventive Services / Frequency Ages 35 to 4 years  Blood pressure check.** / Every 3-5 years.  Lipid and cholesterol check.** / Every 5 years beginning at age 60.  Clinical breast exam.** / Every 3 years for women in their 71s and 10s.  BRCA-related cancer risk assessment.** / For women who have family members with a BRCA-related cancer (breast, ovarian, tubal, or peritoneal cancers).  Pap test.** / Every 2 years from ages 76 through 26. Every 3 years starting at age 61 through age 76 or 93 with a history of 3 consecutive normal Pap tests.  HPV screening.** / Every 3 years from ages 37 through ages 60 to 51 with a history of 3 consecutive normal Pap tests.  Hepatitis C blood test.** / For any individual with known risks for hepatitis C.  Skin self-exam. / Monthly.  Influenza vaccine. / Every year.  Tetanus, diphtheria, and acellular pertussis (Tdap, Td) vaccine.** / Consult your health care provider. Pregnant women should receive 1 dose of Tdap vaccine during each pregnancy. 1 dose of Td every 10 years.  Varicella vaccine.** / Consult your health care provider. Pregnant females who do not have evidence of immunity should receive the first dose after pregnancy.  HPV vaccine. / 3 doses over 6 months, if 93 and younger. The vaccine is not recommended for use in pregnant females. However, pregnancy testing is not needed before receiving a dose.  Measles, mumps, rubella (MMR) vaccine.** / You need at least 1 dose of MMR if you were born in 1957 or later. You may also need a 2nd dose. For females of childbearing age, rubella immunity should be determined. If there is no evidence of immunity, females who are not pregnant should be vaccinated. If there is no evidence of immunity, females who are  pregnant should delay immunization until after pregnancy.  Pneumococcal  13-valent conjugate (PCV13) vaccine.** / Consult your health care provider.  Pneumococcal polysaccharide (PPSV23) vaccine.** / 1 to 2 doses if you smoke cigarettes or if you have certain conditions.  Meningococcal vaccine.** / 1 dose if you are age 68 to 8 years and a Market researcher living in a residence [place], or have one of several medical conditions, you need to get vaccinated against meningococcal disease. You may also need additional booster doses.  Hepatitis A vaccine.** / Consult your health care provider.  Hepatitis B vaccine.** / Consult your health care provider.  Haemophilus influenzae type b (Hib) vaccine.** / Consult your health care provider. Ages 7 to 53 years  Blood pressure check.** / Every year.  Lipid and cholesterol check.** / Every 5 years beginning at age 25 years.  Lung cancer screening. / Every year if you are aged 11-80 years and have a 30-pack-year history of smoking and currently smoke or have quit within the past 15 years. Yearly screening is stopped once you have quit smoking for at least 15 years or develop a health problem that would prevent you from having lung cancer treatment.  Clinical breast exam.** / Every year after age 48 years.  BRCA-related cancer risk assessment.** / For women who have family members with a BRCA-related cancer (breast, ovarian, tubal, or peritoneal cancers).  Mammogram.** / Every year beginning at age 41 years and continuing for as long as you are in good health. Consult with your health care provider.  Pap test.** / Every 3 years starting at age 65 years through age 37 or 70 years with a history of 3 consecutive normal Pap tests.  HPV screening.** / Every 3 years from ages 72 years through ages 60 to 40 years with a history of 3 consecutive normal Pap tests.  Fecal occult blood test (FOBT) of stool. / Every year beginning at age 21 years and continuing until age 5 years. You may not need to do this test if you get  a colonoscopy every 10 years.  Flexible sigmoidoscopy or colonoscopy.** / Every 5 years for a flexible sigmoidoscopy or every 10 years for a colonoscopy beginning at age 35 years and continuing until age 48 years.  Hepatitis C blood test.** / For all people born from [birth year] through 1965 and any individual with known risks for hepatitis C.  Skin self-exam. / Monthly.  Influenza vaccine. / Every year.  Tetanus, diphtheria, and acellular pertussis (Tdap/Td) vaccine.** / Consult your health care provider. Pregnant women should receive 1 dose of Tdap vaccine during each pregnancy. 1 dose of Td every 10 years.  Varicella vaccine.** / Consult your health care provider. Pregnant females who do not have evidence of immunity should receive the first dose after pregnancy.  Zoster vaccine.** / 1 dose for adults aged 30 years or older.  Measles, mumps, rubella (MMR) vaccine.** / You need at least 1 dose of MMR if you were born in 1957 or later. You may also need a second dose. For females of childbearing age, rubella immunity should be determined. If there is no evidence of immunity, females who are not pregnant should be vaccinated. If there is no evidence of immunity, females who are pregnant should delay immunization until after pregnancy.  Pneumococcal 13-valent conjugate (PCV13) vaccine.** / Consult your health care provider.  Pneumococcal polysaccharide (PPSV23) vaccine.** / 1 to 2 doses if you smoke cigarettes or if you have certain conditions.  Meningococcal vaccine.** /  Consult your health care provider.  Hepatitis A vaccine.** / Consult your health care provider.  Hepatitis B vaccine.** / Consult your health care provider.  Haemophilus influenzae type b (Hib) vaccine.** / Consult your health care provider. Ages 64 years and over  Blood pressure check.** / Every year.  Lipid and cholesterol check.** / Every 5 years beginning at age 23 years.  Lung cancer screening. / Every year if you  are aged 16-80 years and have a 30-pack-year history of smoking and currently smoke or have quit within the past 15 years. Yearly screening is stopped once you have quit smoking for at least 15 years or develop a health problem that would prevent you from having lung cancer treatment.  Clinical breast exam.** / Every year after age 74 years.  BRCA-related cancer risk assessment.** / For women who have family members with a BRCA-related cancer (breast, ovarian, tubal, or peritoneal cancers).  Mammogram.** / Every year beginning at age 44 years and continuing for as long as you are in good health. Consult with your health care provider.  Pap test.** / Every 3 years starting at age 58 years through age 22 or 39 years with 3 consecutive normal Pap tests. Testing can be stopped between 65 and 70 years with 3 consecutive normal Pap tests and no abnormal Pap or HPV tests in the past 10 years.  HPV screening.** / Every 3 years from ages 64 years through ages 70 or 61 years with a history of 3 consecutive normal Pap tests. Testing can be stopped between 65 and 70 years with 3 consecutive normal Pap tests and no abnormal Pap or HPV tests in the past 10 years.  Fecal occult blood test (FOBT) of stool. / Every year beginning at age 40 years and continuing until age 27 years. You may not need to do this test if you get a colonoscopy every 10 years.  Flexible sigmoidoscopy or colonoscopy.** / Every 5 years for a flexible sigmoidoscopy or every 10 years for a colonoscopy beginning at age 7 years and continuing until age 32 years.  Hepatitis C blood test.** / For all people born from [birth year] through 1965 and any individual with known risks for hepatitis C.  Osteoporosis screening.** / A one-time screening for women ages 30 years and over and women at risk for fractures or osteoporosis.  Skin self-exam. / Monthly.  Influenza vaccine. / Every year.  Tetanus, diphtheria, and acellular pertussis (Tdap/Td)  vaccine.** / 1 dose of Td every 10 years.  Varicella vaccine.** / Consult your health care provider.  Zoster vaccine.** / 1 dose for adults aged 35 years or older.  Pneumococcal 13-valent conjugate (PCV13) vaccine.** / Consult your health care provider.  Pneumococcal polysaccharide (PPSV23) vaccine.** / 1 dose for all adults aged 46 years and older.  Meningococcal vaccine.** / Consult your health care provider.  Hepatitis A vaccine.** / Consult your health care provider.  Hepatitis B vaccine.** / Consult your health care provider.  Haemophilus influenzae type b (Hib) vaccine.** / Consult your health care provider. ** Family history and personal history of risk and conditions may change your health care provider's recommendations.   This information is not intended to replace advice given to you by your health care provider. Make sure you discuss any questions you have with your health care provider.   Document Released: 04/15/2001 Document Revised: 03/10/2014 Document Reviewed: 07/15/2010 Elsevier Interactive Patient Education [employer].

## 2015-11-23 NOTE — Assessment & Plan Note (Signed)
Has just undergone colposcopy and is going to have a repeat exam in one year

## 2015-11-23 NOTE — Assessment & Plan Note (Signed)
Pain is much better with increased exercise and better diet

## 2015-11-23 NOTE — Assessment & Plan Note (Addendum)
Patient encouraged to maintain heart healthy diet, regular exercise, adequate sleep. Consider daily probiotics. Take medications as prescribed. [facility]. Given and reviewed copy of ACP documents from [employer] of [place] and encouraged to complete and return. Tdap today

## 2015-11-23 NOTE — Progress Notes (Signed)
Pre visit review using our clinic review tool, if applicable. No additional management support is needed unless otherwise documented below in the visit note. 

## 2015-12-21 ENCOUNTER — Encounter: Payer: BC Managed Care – PPO | Admitting: Family Medicine

## 2016-05-06 NOTE — Progress Notes (Unsigned)
Patient called [facility] with c/o irregular heart beat. Appointment scheduled with Dr. Abner GreenspanBlyth for May 08, 2016 per advisement of [facility]. Follow up call made to patient left message with instructions to go to ED if she should develop chest pains or SOB before her appointment.

## 2016-05-08 ENCOUNTER — Ambulatory Visit (INDEPENDENT_AMBULATORY_CARE_PROVIDER_SITE_OTHER): Payer: BC Managed Care – PPO | Admitting: Family Medicine

## 2016-05-08 ENCOUNTER — Encounter: Payer: Self-pay | Admitting: Family Medicine

## 2016-05-08 VITALS — BP 108/68 | HR 77 | Temp 98.1°F | Ht 62.0 in | Wt 132.0 lb

## 2016-05-08 DIAGNOSIS — R42 Dizziness and giddiness: Secondary | ICD-10-CM | POA: Diagnosis not present

## 2016-05-08 DIAGNOSIS — R002 Palpitations: Secondary | ICD-10-CM

## 2016-05-08 DIAGNOSIS — R5383 Other fatigue: Secondary | ICD-10-CM

## 2016-05-08 DIAGNOSIS — D649 Anemia, unspecified: Secondary | ICD-10-CM | POA: Diagnosis not present

## 2016-05-08 HISTORY — DX: Anemia, unspecified: D64.9

## 2016-05-08 LAB — COMPREHENSIVE METABOLIC PANEL
ALT: 13 U/L (ref 0–35)
AST: 17 U/L (ref 0–37)
Albumin: 4.3 g/dL (ref 3.5–5.2)
Alkaline Phosphatase: 44 U/L (ref 39–117)
BILIRUBIN TOTAL: 1 mg/dL (ref 0.2–1.2)
BUN: 10 mg/dL (ref 6–23)
CALCIUM: 9.5 mg/dL (ref 8.4–10.5)
CO2: 29 mEq/L (ref 19–32)
CREATININE: 0.67 mg/dL (ref 0.40–1.20)
Chloride: 105 mEq/L (ref 96–112)
GFR: 104.03 mL/min (ref >60.00)
Glucose, Bld: 86 mg/dL (ref 70–99)
Potassium: 4.2 mEq/L (ref 3.5–5.1)
SODIUM: 140 meq/L (ref 135–145)
Total Protein: 7.2 g/dL (ref 6.0–8.3)

## 2016-05-08 LAB — CBC
HEMATOCRIT: 38.9 % (ref 36.0–46.0)
Hemoglobin: 13.4 g/dL (ref 12.0–15.0)
MCHC: 34.5 g/dL (ref 30.0–36.0)
MCV: 94.5 fl (ref 78.0–100.0)
Platelets: 196 10*3/uL (ref 150.0–400.0)
RBC: 4.12 Mil/uL (ref 3.87–5.11)
RDW: 12.3 % (ref 11.5–15.5)
WBC: 3.4 10*3/uL — ABNORMAL LOW (ref 4.0–10.5)

## 2016-05-08 LAB — TSH: TSH: 1.71 u[IU]/mL (ref 0.35–4.50)

## 2016-05-08 NOTE — Assessment & Plan Note (Signed)
Increase leafy greens, consider increased lean red meat and using cast iron cookware. Continue to monitor, report any concerns, check cbc 

## 2016-05-08 NOTE — Progress Notes (Signed)
Patient ID: Valerie Ruiz, female   DOB: 1975/04/01, 41 y.o.   MRN: [id number]   Subjective:    Patient ID: Valerie Ruiz, female    DOB: 07-Jan-1976, 41 y.o.   MRN: [id number]  Chief Complaint  Patient presents with  . Palpitations  . Fatigue  . Dizziness    HPI Patient is in today for 2 week history of increased palpitations occurring most days. Initially she had some episodes of dizziness with the room spinning but that has resolved. She drinks caffeine in the am but her intake has not changed. She has changed her job role some but does not feel she has been too stressed by that. She also endorses generalized fatigue not worse with episodes. No SOB, diaphoresis or nausea with episodes. No change in diet or sleep patterns. Denies CP/SOB/HA/congestion/fevers/GI or GU c/o. Taking meds as prescribed  Past Medical History:  Diagnosis Date  . Acute bronchitis 08/28/2015  . Allergic rhinitis   . Anemia 05/08/2016  . Asthma   . Chronic headaches   . Depression   . Glaucoma   . History of ovulatory pain 09/10/2014  . LLQ pain 09/10/2014  . Preventative health care 12/24/2014    Past Surgical History:  Procedure Laterality Date  . ANKLE SURGERY     Broken ankle    Family History  Problem Relation Age of Onset  . Hyperthyroidism Mother   . Breast cancer Paternal Grandmother   . Cancer Paternal Grandmother     metastatic breast with mets to bone  . Gout Father   . Heart disease Maternal Grandmother 17    MI  . Stroke Maternal Grandfather     Social History   Social History  . Marital status: Married    Spouse name: N/A  . Number of children: N/A  . Years of education: N/A   Occupational History  . Not on file.   Social History Main Topics  . Smoking status: Never Smoker  . Smokeless tobacco: Never Used  . Alcohol use No  . Drug use: No  . Sexual activity: Yes    Partners: Female     Comment: lives with spouse, works as Designer, industrial/product support at [employer]    Other Topics Concern  . Not on file   Social History Narrative  . No narrative on file    Outpatient Medications Prior to Visit  Medication Sig Dispense Refill  . metoprolol tartrate (LOPRESSOR) 25 MG tablet Take half tab twice daily 60 tablet 3   No facility-administered medications prior to visit.     No Known Allergies  Review of Systems  Constitutional: Negative for fever and malaise/fatigue.  HENT: Negative for congestion.   Eyes: Negative for blurred vision.  Respiratory: Negative for shortness of breath.   Cardiovascular: Positive for palpitations. Negative for chest pain and leg swelling.  Gastrointestinal: Negative for abdominal pain, blood in stool and nausea.  Genitourinary: Negative for dysuria and frequency.  Musculoskeletal: Negative for falls.  Skin: Negative for rash.  Neurological: Positive for dizziness. Negative for loss of consciousness and headaches.  Endo/Heme/Allergies: Negative for environmental allergies.  Psychiatric/Behavioral: Negative for depression. The patient is not nervous/anxious.        Objective:    Physical Exam  Constitutional: She is oriented to person, place, and time. She appears well-developed and well-nourished. No distress.  HENT:  Head: Normocephalic and atraumatic.  Nose: Nose normal.  Eyes: Right eye exhibits no discharge. Left eye exhibits no discharge.  Neck: Normal range of motion. Neck supple.  Cardiovascular: Normal rate and regular rhythm.   No murmur heard. Pulmonary/Chest: Effort normal and breath sounds normal.  Abdominal: Soft. Bowel sounds are normal. There is no tenderness.  Musculoskeletal: She exhibits no edema.  Neurological: She is alert and oriented to person, place, and time.  Skin: Skin is warm and dry.  Psychiatric: She has a normal mood and affect.  Nursing note and vitals reviewed.   BP 108/68 (BP Location: Left Arm, Patient Position: Sitting, Cuff Size: Normal)   Pulse 77    Temp 98.1 F (36.7 C) (Oral)   Ht 5\' 2"  (1.575 m)   Wt 132 lb (59.9 kg)   SpO2 97%   BMI 24.14 kg/m  Wt Readings from Last 3 Encounters:  05/08/16 132 lb (59.9 kg)  11/23/15 131 lb 8 oz (59.6 kg)  11/15/15 123 lb (55.8 kg)     Lab Results  Component Value Date   WBC 3.4 (L) 11/23/2015   HGB 11.4 (L) 11/23/2015   HCT 33.8 (L) 11/23/2015   PLT 188.0 11/23/2015   GLUCOSE 90 12/29/2014   CHOL 142 12/15/2014   TRIG 38.0 12/15/2014   HDL 59.00 12/15/2014   LDLCALC 76 12/15/2014   ALT 13 12/29/2014   AST 19 12/29/2014   NA 141 12/29/2014   K 4.7 12/29/2014   CL 105 12/29/2014   CREATININE 0.69 12/29/2014   BUN 11 12/29/2014   CO2 28 12/29/2014   TSH 1.40 12/15/2014    Lab Results  Component Value Date   TSH 1.40 12/15/2014   Lab Results  Component Value Date   WBC 3.4 (L) 11/23/2015   HGB 11.4 (L) 11/23/2015   HCT 33.8 (L) 11/23/2015   MCV 86.1 11/23/2015   PLT 188.0 11/23/2015   Lab Results  Component Value Date   NA 141 12/29/2014   K 4.7 12/29/2014   CO2 28 12/29/2014   GLUCOSE 90 12/29/2014   BUN 11 12/29/2014   CREATININE 0.69 12/29/2014   BILITOT 0.9 12/29/2014   ALKPHOS 48 12/29/2014   AST 19 12/29/2014   ALT 13 12/29/2014   PROT 7.5 12/29/2014   ALBUMIN 4.3 12/29/2014   CALCIUM 9.6 12/29/2014   GFR 101.28 12/29/2014   Lab Results  Component Value Date   CHOL 142 12/15/2014   Lab Results  Component Value Date   HDL 59.00 12/15/2014   Lab Results  Component Value Date   LDLCALC 76 12/15/2014   Lab Results  Component Value Date   TRIG 38.0 12/15/2014   Lab Results  Component Value Date   CHOLHDL 2 12/15/2014   No results found for: HGBA1C     Assessment & Plan:   Problem List Items Addressed This Visit    Dizziness    Noted episodes of the room spinning a couple of weeks ago.      Relevant Orders   EKG 12-Lead (Completed)   PALPITATIONS, OCCASIONAL    Recent increased frequency lately. ekg done today, labs ordered,  symptoms began about 2 weeks. Discussed need to cut out caffeine but her intake is stable. Will likely proceed wit event monitor if labs are normal      Anemia    Increase leafy greens, consider increased lean red meat and using cast iron cookware. Continue to monitor, report any concerns, check cbc.       Other Visit Diagnoses    Heart palpitations    -  Primary   Relevant Orders  EKG 12-Lead (Completed)   Comprehensive metabolic panel   CBC   TSH   Other fatigue       Relevant Orders   EKG 12-Lead (Completed)      I am having Ms. Swartz maintain her metoprolol tartrate.  No orders of the defined types were placed in this encounter.    Danise Edge, MD

## 2016-05-08 NOTE — Patient Instructions (Signed)
Palpitations A palpitation is the feeling that your heartbeat is irregular or is faster than normal. It may feel like your heart is fluttering or skipping a beat. Palpitations are usually not a serious problem. They may be caused by many things, including smoking, caffeine, alcohol, stress, and certain medicines. Although most causes of palpitations are not serious, palpitations can be a sign of a serious medical problem. In some cases, you may need further medical evaluation. Follow these instructions at home: Pay attention to any changes in your symptoms. Take these actions to help with your condition:  Avoid the following: ? Caffeinated coffee, tea, soft drinks, diet pills, and energy drinks. ? Chocolate. ? Alcohol.  Do not use any tobacco products, such as cigarettes, chewing tobacco, and e-cigarettes. If you need help quitting, ask your health care provider.  Try to reduce your stress and anxiety. Things that can help you relax include: ? Yoga. ? Meditation. ? Physical activity, such as swimming, jogging, or walking. ? Biofeedback. This is a method that helps you learn to use your mind to control things in your body, such as your heartbeats.  Get plenty of rest and sleep.  Take over-the-counter and prescription medicines only as told by your health care provider.  Keep all follow-up visits as told by your health care provider. This is important.  Contact a health care provider if:  You continue to have a fast or irregular heartbeat after 24 hours.  Your palpitations occur more often. Get help right away if:  You have chest pain or shortness of breath.  You have a severe headache.  You feel dizzy or you faint. This information is not intended to replace advice given to you by your health care provider. Make sure you discuss any questions you have with your health care provider. Document Released: 02/15/2000 Document Revised: 07/23/2015 Document Reviewed: 11/02/2014 Elsevier  Interactive Patient Education  2017 Elsevier Inc.  

## 2016-05-08 NOTE — Assessment & Plan Note (Addendum)
Recent increased frequency lately. ekg done today, labs ordered, symptoms began about 2 weeks. Discussed need to cut out caffeine but her intake is stable. Will likely proceed wit event monitor if labs are normal

## 2016-05-08 NOTE — Assessment & Plan Note (Signed)
Noted episodes of the room spinning a couple of weeks ago.

## 2016-05-08 NOTE — Progress Notes (Signed)
Pre visit review using our clinic review tool, if applicable. No additional management support is needed unless otherwise documented below in the visit note. 

## 2016-05-09 ENCOUNTER — Encounter: Payer: Self-pay | Admitting: Family Medicine

## 2016-05-11 ENCOUNTER — Other Ambulatory Visit: Payer: Self-pay | Admitting: Family Medicine

## 2016-05-11 DIAGNOSIS — R002 Palpitations: Secondary | ICD-10-CM

## 2016-05-19 ENCOUNTER — Other Ambulatory Visit: Payer: Self-pay | Admitting: Family Medicine

## 2016-05-19 MED ORDER — METOPROLOL TARTRATE 25 MG PO TABS: ORAL_TABLET | ORAL | 3 refills | Status: DC

## 2016-05-21 ENCOUNTER — Ambulatory Visit (INDEPENDENT_AMBULATORY_CARE_PROVIDER_SITE_OTHER): Payer: BC Managed Care – PPO

## 2016-05-21 DIAGNOSIS — R002 Palpitations: Secondary | ICD-10-CM

## 2016-11-28 ENCOUNTER — Encounter: Payer: BC Managed Care – PPO | Admitting: Family Medicine

## 2017-01-02 ENCOUNTER — Ambulatory Visit (INDEPENDENT_AMBULATORY_CARE_PROVIDER_SITE_OTHER): Payer: Managed Care, Other (non HMO) | Admitting: Family Medicine

## 2017-01-02 ENCOUNTER — Encounter: Payer: Self-pay | Admitting: Family Medicine

## 2017-01-02 VITALS — BP 104/70 | Ht 62.0 in | Wt 132.0 lb

## 2017-01-02 DIAGNOSIS — N870 Mild cervical dysplasia: Secondary | ICD-10-CM

## 2017-01-02 DIAGNOSIS — Z124 Encounter for screening for malignant neoplasm of cervix: Secondary | ICD-10-CM | POA: Diagnosis not present

## 2017-01-02 DIAGNOSIS — Z01419 Encounter for gynecological examination (general) (routine) without abnormal findings: Secondary | ICD-10-CM | POA: Diagnosis not present

## 2017-01-02 NOTE — Progress Notes (Signed)
GYNECOLOGY ANNUAL PREVENTATIVE CARE ENCOUNTER NOTE  Subjective:   Valerie Ruiz is a 41 y.o. G0P0000 female here for a routine annual gynecologic exam.  Current complaints: none.   Denies abnormal vaginal bleeding, discharge, pelvic pain, problems with intercourse or other gynecologic concerns.    Gynecologic History Patient's last menstrual period was 12/22/2016. 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 Acute bronchitis 08/28/2015  . Allergic rhinitis   . Anemia 05/08/2016  . Asthma   . Chronic headaches   . Depression   . Glaucoma   . History of ovulatory pain 09/10/2014  . LLQ pain 09/10/2014  . Preventative health care 12/24/2014    Past Surgical History:  Procedure Laterality Date  . ANKLE SURGERY     Broken ankle    Current Outpatient Prescriptions on File Prior to Visit  Medication Sig Dispense Refill  . metoprolol tartrate (LOPRESSOR) 25 MG tablet Take half tab twice daily 60 tablet 3   No current facility-administered medications on file prior to visit.     No Known Allergies  Social History   Social History  . Marital status: Married    Spouse name: N/A  . Number of children: N/A  . Years of education: N/A   Occupational History  . Not on file.   Social History Main Topics  . Smoking status: Never Smoker  . Smokeless tobacco: Never Used  . Alcohol use No  . Drug use: No  . 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 Not on file   Social History Narrative  . No narrative on file    Family History  Problem Relation Age of Onset    . Hyperthyroidism Mother   . Breast cancer Paternal Grandmother   . Cancer Paternal Grandmother        metastatic breast with mets to bone  . Gout Father   . Heart disease Maternal Grandmother [phone]       MI  . Stroke Maternal Grandfather     The following portions of the patient's history were reviewed and updated as appropriate: allergies, current medications, past family history, past medical history, past social history, past surgical history and problem list.  Review of Systems Pertinent items are noted in HPI.   Objective:  BP 104/70 (BP Location: Left Arm)   Ht 5\' 2"  (1.575 m)   Wt 132 lb (59.9 kg)   LMP 12/22/2016   BMI 24.14 kg/m  CONSTITUTIONAL: Well-developed, well-nourished female in no acute distress.  HENT:  Normocephalic, atraumatic, External right and left ear normal. Oropharynx is clear and moist EYES: Conjunctivae and EOM are normal. Pupils are equal, round, and reactive to light. No scleral icterus.  NECK: Normal range of motion, supple, no masses.  Normal thyroid.   CARDIOVASCULAR: Normal heart rate noted, regular rhythm RESPIRATORY: Clear to auscultation bilaterally. Effort and breath sounds normal, no problems with respiration noted. BREASTS: Symmetric in size. No masses, skin changes, nipple drainage, or lymphadenopathy. ABDOMEN: Soft, normal bowel  sounds, no distention noted.  No tenderness, rebound or guarding.  PELVIC: Normal appearing external genitalia; normal appearing vaginal mucosa and cervix.  No abnormal discharge noted.  Pap smear obtained.  Normal uterine size, no other palpable masses, no uterine or adnexal tenderness. MUSCULOSKELETAL: Normal range of motion. No tenderness.  No cyanosis, clubbing, or edema.  2+ distal pulses. SKIN: Skin is warm and dry. No rash noted. Not diaphoretic. No erythema. No pallor. NEUROLOGIC: Alert and oriented to person, place, and time. Normal reflexes, muscle tone coordination. No cranial nerve deficit  noted. PSYCHIATRIC: Normal mood and affect. Normal behavior. Normal judgment and thought content.  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 Will follow up results of pap smear and manage accordingly. Discussed exercise and diet Calcium and Vitamin D supplementation discussed  Routine preventative health maintenance measures emphasized. Please refer to After Visit Summary for other counseling recommendations.    Candelaria Celeste, DO [facility]

## 2017-01-06 ENCOUNTER — Telehealth: Payer: Self-pay

## 2017-01-06 ENCOUNTER — Encounter: Payer: Self-pay | Admitting: Family Medicine

## 2017-01-06 LAB — CYTOLOGY - PAP: DIAGNOSIS: HIGH — AB

## 2017-01-06 NOTE — Telephone Encounter (Signed)
-----   Message from Levie HeritageJacob J Stinson, DO sent at 01/06/2017 10:10 AM EST ----- Please schedule the patient for colposcopy as soon as possible. I emailed the patient the results. Thanks!

## 2017-01-06 NOTE — Telephone Encounter (Signed)
Left message for patient to return call to office.  Caitrin Pendergraph RNBSN 

## 2017-01-07 NOTE — Telephone Encounter (Signed)
Patient is aware of results via my chart. Patient is able to come for colposcopy on Friday 01-09-17. Armandina StammerJennifer Howard RNBSN

## 2017-01-09 ENCOUNTER — Ambulatory Visit (INDEPENDENT_AMBULATORY_CARE_PROVIDER_SITE_OTHER): Payer: Managed Care, Other (non HMO) | Admitting: Family Medicine

## 2017-01-09 ENCOUNTER — Other Ambulatory Visit (HOSPITAL_COMMUNITY)
Admission: RE | Admit: 2017-01-09 | Discharge: 2017-01-09 | Disposition: A | Discharge status: Home / Self Care | Payer: Managed Care, Other (non HMO) | Source: Ambulatory Visit | Attending: Family Medicine | Admitting: Family Medicine

## 2017-01-09 VITALS — BP 119/65 | HR 74 | Wt 132.0 lb

## 2017-01-09 DIAGNOSIS — D069 Carcinoma in situ of cervix, unspecified: Secondary | ICD-10-CM | POA: Insufficient documentation

## 2017-01-09 DIAGNOSIS — Z01812 Encounter for preprocedural laboratory examination: Secondary | ICD-10-CM

## 2017-01-09 DIAGNOSIS — Z3202 Encounter for pregnancy test, result negative: Secondary | ICD-10-CM | POA: Diagnosis not present

## 2017-01-09 LAB — POCT URINE PREGNANCY: Preg Test, Ur: NEGATIVE

## 2017-01-09 NOTE — Patient Instructions (Signed)
COLPOSCOPY POST-PROCEDURE INSTRUCTIONS  1. You may take Ibuprofen, Aleve or Tylenol for cramping if needed.  2. If Monsel's solution was used, you will have a black discharge.  3. Light bleeding is normal.  If bleeding is heavier than your period, please call.  4. Put nothing in your vagina until the bleeding or discharge stops (usually 2 or 3 days).  5. We will call you within one week with biopsy results or discuss the results at your follow-up appointment if needed.  

## 2017-01-09 NOTE — Progress Notes (Signed)
Patient Name: Valerie Ruiz, female   DOB: [birth year], 41 y.o.  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    Exam: Vulva and Vagina grossly normal.  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  Will call patient with results.

## 2017-01-15 ENCOUNTER — Encounter: Payer: Self-pay | Admitting: Family Medicine

## 2017-02-06 ENCOUNTER — Other Ambulatory Visit (HOSPITAL_COMMUNITY)
Admission: RE | Admit: 2017-02-06 | Discharge: 2017-02-06 | Disposition: A | Discharge status: Home / Self Care | Payer: Managed Care, Other (non HMO) | Source: Ambulatory Visit | Attending: Family Medicine | Admitting: Family Medicine

## 2017-02-06 ENCOUNTER — Ambulatory Visit (INDEPENDENT_AMBULATORY_CARE_PROVIDER_SITE_OTHER): Payer: Managed Care, Other (non HMO) | Admitting: Family Medicine

## 2017-02-06 ENCOUNTER — Encounter: Payer: Self-pay | Admitting: Family Medicine

## 2017-02-06 VITALS — BP 114/71 | HR 80 | Wt 132.0 lb

## 2017-02-06 DIAGNOSIS — D069 Carcinoma in situ of cervix, unspecified: Secondary | ICD-10-CM

## 2017-02-06 DIAGNOSIS — N871 Moderate cervical dysplasia: Secondary | ICD-10-CM | POA: Diagnosis not present

## 2017-02-06 DIAGNOSIS — Z01812 Encounter for preprocedural laboratory examination: Secondary | ICD-10-CM

## 2017-02-06 DIAGNOSIS — Z3202 Encounter for pregnancy test, result negative: Secondary | ICD-10-CM

## 2017-02-06 LAB — POCT URINE PREGNANCY: PREG TEST UR: NEGATIVE

## 2017-02-06 NOTE — Progress Notes (Signed)
  LEEP PROCEDURE NOTE Pap smear and colposcopy reviewed.   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  Role of HPV,cervical dysplasia and need for close followup was empasized. Informed written consent was obtained. All questions were answered. Time out performed.  Procedure: The patient was placed in lithotomy position and the bivalved coated speculum was placed in the patient's vagina. A grounding pad placed on the patient. Acetic acid solution was applied to the cervix and areas of acetowhite were noted 5-7 oclock.   Local anesthesia was administered via an intracervical block using 20cc of 2% Lidocaine with epinephrine. The suction was turned on and the Large 1X Fisher Cone Biopsy Excisor on 50 Watts of cutting current was used to excise the area of decreased uptake and excise the entire transformation zone. Excellent hemostasis was achieved using roller ball coagulation set at 50 Watts coagulation current. Monsel's solution was then applied and the speculum was removed from the vagina. Specimens were sent to pathology. The patient tolerated the procedure well. Post-operative instructions given to patient, including instruction to seek medical attention for persistent bright red bleeding, fever, abdominal/pelvic pain, dysuria, nausea or vomiting. She was also told about the possibility of having copious yellow to black tinged discharge. She was counseled to avoid anything in the vagina (sex/douching/tampons) for 4-6 weeks. She has a  2-week post-operative check to review results and assess wound healing. Follow up in 4 months for repeat pap or as needed.

## 2017-02-06 NOTE — Addendum Note (Signed)
Addended by: Anell BarrHOWARD, Khye Hochstetler L on: 02/06/2017 12:05 PM   Modules accepted: Orders

## 2017-02-06 NOTE — Patient Instructions (Signed)

## 2017-02-12 ENCOUNTER — Encounter: Payer: Managed Care, Other (non HMO) | Admitting: Obstetrics & Gynecology

## 2017-02-14 ENCOUNTER — Encounter: Payer: Self-pay | Admitting: Family Medicine

## 2017-02-16 ENCOUNTER — Encounter: Payer: Self-pay | Admitting: Family Medicine

## 2017-02-16 ENCOUNTER — Ambulatory Visit (INDEPENDENT_AMBULATORY_CARE_PROVIDER_SITE_OTHER): Payer: Managed Care, Other (non HMO) | Admitting: Family Medicine

## 2017-02-16 DIAGNOSIS — N871 Moderate cervical dysplasia: Secondary | ICD-10-CM

## 2017-02-16 NOTE — Progress Notes (Signed)
   Subjective:    Patient ID: Valerie MoriShannon F Carmickle, female    DOB: [birth year], 41 y.o.   MRN: [id number]  HPI Patient seen for follow up of LEEP. Path results show CIN2 with clear margins. Patient experiencing no complaints. Minimal grey discharge. No pain.   Review of Systems     Objective:   Physical Exam  Constitutional: She is oriented to person, place, and time. She appears well-developed and well-nourished.  Neurological: She is alert and oriented to person, place, and time.  Skin: Skin is warm and dry.  Psychiatric: She has a normal mood and affect. Her behavior is normal. Judgment and thought content normal.       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 Pt to call with any problems.

## 2017-02-20 ENCOUNTER — Other Ambulatory Visit: Payer: Self-pay

## 2017-02-20 ENCOUNTER — Telehealth: Payer: Self-pay | Admitting: Family Medicine

## 2017-02-20 MED ORDER — METOPROLOL TARTRATE 25 MG PO TABS: ORAL_TABLET | ORAL | 3 refills | Status: DC

## 2017-02-20 NOTE — Telephone Encounter (Signed)
Copied from CRM [id number]. Topic: Quick Communication - Rx Refill/Question >> Feb 20, 2017  9:12 AM Crist InfanteHarrald, Kathy J wrote: [facility] request refill of metoprolol tartrate (LOPRESSOR) 25 MG tablet 90 day  [place] ok   [phone] Fax: [phone]

## 2017-02-20 NOTE — Telephone Encounter (Signed)
rx sent

## 2017-05-29 ENCOUNTER — Ambulatory Visit (INDEPENDENT_AMBULATORY_CARE_PROVIDER_SITE_OTHER): Payer: 59 | Admitting: Psychology

## 2017-05-29 DIAGNOSIS — F331 Major depressive disorder, recurrent, moderate: Secondary | ICD-10-CM | POA: Diagnosis not present

## 2017-07-03 ENCOUNTER — Ambulatory Visit (INDEPENDENT_AMBULATORY_CARE_PROVIDER_SITE_OTHER): Payer: BC Managed Care – PPO | Admitting: Psychology

## 2017-07-03 DIAGNOSIS — F331 Major depressive disorder, recurrent, moderate: Secondary | ICD-10-CM

## 2017-07-28 ENCOUNTER — Emergency Department
Admission: EM | Admit: 2017-07-28 | Discharge: 2017-07-28 | Disposition: A | Discharge status: Home / Self Care | Payer: BC Managed Care – PPO | Source: Home / Self Care | Attending: Family Medicine | Admitting: Family Medicine

## 2017-07-28 ENCOUNTER — Other Ambulatory Visit: Payer: Self-pay

## 2017-07-28 ENCOUNTER — Encounter: Payer: Self-pay | Admitting: Emergency Medicine

## 2017-07-28 DIAGNOSIS — R350 Frequency of micturition: Secondary | ICD-10-CM | POA: Diagnosis not present

## 2017-07-28 DIAGNOSIS — R319 Hematuria, unspecified: Secondary | ICD-10-CM

## 2017-07-28 DIAGNOSIS — N390 Urinary tract infection, site not specified: Secondary | ICD-10-CM

## 2017-07-28 LAB — POCT URINALYSIS DIP (MANUAL ENTRY)
Bilirubin, UA: NEGATIVE
Glucose, UA: NEGATIVE mg/dL
Ketones, POC UA: NEGATIVE mg/dL
Nitrite, UA: NEGATIVE
Protein Ur, POC: 30 mg/dL — AB
Spec Grav, UA: 1.025 (ref 1.010–1.025)
Urobilinogen, UA: 0.2 E.U./dL
pH, UA: 7 (ref 5.0–8.0)

## 2017-07-28 MED ORDER — CEPHALEXIN 500 MG PO CAPS: 500.0000 mg | ORAL_CAPSULE | Freq: Two times a day (BID) | ORAL | 0 refills | Status: DC

## 2017-07-28 NOTE — Discharge Instructions (Signed)
°  Please take antibiotics as prescribed and be sure to complete entire course even if you start to feel better to ensure infection does not come back.  Be sure to stay well hydrated.  Your urine has been sent for further testing to grow out the type of bacteria. You have been started on an antibiotic that covers the most common bacteria.  In some occasions the antibiotic will need to be changed.  If this occurs, or if the culture does not grow out any bacteria, you will be notified.   Please follow up with primary care in 1 week if not improving.

## 2017-07-28 NOTE — ED Triage Notes (Signed)
42 y.o female presents to the clinic with dysuria, frequency, and lower abd pain that began on Sunday. She denies fever, chills.

## 2017-07-28 NOTE — ED Provider Notes (Signed)
[facility]    CSN: [id number] Arrival date & time: 07/28/17  0807     History   Chief Complaint Chief Complaint  Patient presents with  . Dysuria    HPI Valerie Ruiz is a 42 y.o. female.   HPI  Valerie Ruiz is a 42 y.o. female presenting to UC with c/o gradually worsening dysuria, urinary frequency, and lower abdominal pain that is mildly aching.  Denies fever, chills, n/v/d. She has not tried anything for her symptoms. Hx of UTI "many years ago."  No change in her daily routine. Denies back pain. No hx of kidney stones. No vaginal symptoms.    Past Medical History:  Diagnosis Date  . Acute bronchitis 08/28/2015  . Allergic rhinitis   . Anemia 05/08/2016  . Asthma   . Chronic headaches   . Depression   . Glaucoma   . History of ovulatory pain 09/10/2014  . LLQ pain 09/10/2014  . Preventative health care 12/24/2014    Patient Active Problem List   Diagnosis Date Noted  . Anemia 05/08/2016  . Leukopenia 11/23/2015  . 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 Preventative health care 12/24/2014  . History of ovulatory pain 09/10/2014  . Sacral dysfunction 01/30/2014  . Scapular dyskinesis 12/02/2013  . Nonallopathic lesion of cervical region 12/02/2013  . Nonallopathic lesion of thoracic region 12/02/2013  . Nonallopathic lesion of lumbar region 12/02/2013  . Cervicogenic headache 11/28/2013  . Dizziness 10/06/2008  . PALPITATIONS, OCCASIONAL 10/06/2008  . SEBACEOUS CYST, BREAST 04/25/2008    Past Surgical History:  Procedure Laterality Date  . ANKLE SURGERY     Broken ankle    OB History    Gravida  0   Para  0   Term  0   Preterm  0   AB  0   Living  0     SAB  0   TAB  0   Ectopic  0   Multiple  0   Live Births  0            Home Medications    Prior to Admission medications   Medication Sig Start Date End Date Taking? Authorizing Provider  cephALEXin (KEFLEX) 500 MG capsule Take 1 capsule (500 mg  total) by mouth 2 (two) times daily. 07/28/17   Lurene Shadow, PA-C  metoprolol tartrate (LOPRESSOR) 25 MG tablet Take half tab twice daily 02/20/17   [name], MD    Family History Family History  Problem Relation Age of Onset  . Hyperthyroidism Mother   . Breast cancer Paternal Grandmother   . Cancer Paternal Grandmother        metastatic breast with mets to bone  . Gout Father   . Heart disease Maternal Grandmother 45       MI  . Stroke Maternal Grandfather     Social History Social History   Tobacco Use  . Smoking status: Never Smoker  . Smokeless tobacco: Never Used  Substance Use Topics  . Alcohol use: No    Alcohol/week: 1.8 oz    Types: 3 Standard drinks or equivalent per week  . Drug use: No     Allergies   Patient has no known allergies.   Review of Systems Review of Systems  Constitutional: Negative for chills and fever.  Gastrointestinal: Positive for abdominal pain. Negative for diarrhea, nausea and vomiting.  Genitourinary: Positive for dysuria, frequency and urgency. Negative for decreased urine volume,  hematuria and pelvic pain.  Musculoskeletal: Negative for back pain.     Physical Exam Triage Vital Signs ED Triage Vitals  Enc Vitals Group     BP 07/28/17 0825 115/77     Pulse Rate 07/28/17 0825 85     Resp --      Temp 07/28/17 0825 98.2 F (36.8 C)     Temp Source 07/28/17 0825 Oral     SpO2 07/28/17 0825 100 %     Weight 07/28/17 0826 135 lb (61.2 kg)     Height 07/28/17 0826  (1.575 m)     Head Circumference --      Peak Flow --      Pain Score 07/28/17 0826 3     Pain Loc --      Pain Edu? --      Excl. in GC? --    No data found.  Updated Vital Signs BP 115/77 (BP Location: Right Arm)   Pulse 85   Temp 98.2 F (36.8 C) (Oral)   Ht  (1.575 m)   Wt 135 lb (61.2 kg)   SpO2 100%   BMI 24.69 kg/m   Visual Acuity Right Eye Distance:   Left Eye Distance:   Bilateral Distance:    Right Eye Near:   Left  Eye Near:    Bilateral Near:     Physical Exam  Constitutional: She is oriented to person, place, and time. She appears well-developed and well-nourished. No distress.  HENT:  Head: Normocephalic and atraumatic.  Mouth/Throat: Oropharynx is clear and moist.  Eyes: EOM are normal.  Neck: Normal range of motion.  Cardiovascular: Normal rate and regular rhythm.  Pulmonary/Chest: Effort normal and breath sounds normal. No stridor. No respiratory distress. She has no wheezes. She has no rales.  Abdominal: Soft. She exhibits no distension. There is no tenderness. There is no CVA tenderness.  Musculoskeletal: Normal range of motion.  Neurological: She is alert and oriented to person, place, and time.  Skin: Skin is warm and dry. She is not diaphoretic.  Psychiatric: She has a normal mood and affect. Her behavior is normal.  Nursing note and vitals reviewed.    UC Treatments / Results  Labs (all labs ordered are listed, but only abnormal results are displayed) Labs Reviewed  POCT URINALYSIS DIP (MANUAL ENTRY) - Abnormal; Notable for the following components:      Result Value   Blood, UA large (*)    Protein Ur, POC =30 (*)    Leukocytes, UA Large (3+) (*)    All other components within normal limits  URINE CULTURE    EKG None  Radiology No results found.  Procedures Procedures (including critical care time)  Medications Ordered in UC Medications - No data to display  Initial Impression / Assessment and Plan / UC Course  I have reviewed the triage vital signs and the nursing notes.  Pertinent labs & imaging results that were available during my care of the patient were reviewed by me and considered in my medical decision making (see chart for details).     Hx and UA c/w UTI Culture sent Will start on Keflex while culture pending.  Final Clinical Impressions(s) / UC Diagnoses   Final diagnoses:  Urinary frequency  Urinary tract infection with hematuria, site  unspecified     Discharge Instructions      Please take antibiotics as prescribed and be sure to complete entire course even if you start to feel  better to ensure infection does not come back.  Be sure to stay well hydrated.  Your urine has been sent for further testing to grow out the type of bacteria. You have been started on an antibiotic that covers the most common bacteria.  In some occasions the antibiotic will need to be changed.  If this occurs, or if the culture does not grow out any bacteria, you will be notified.   Please follow up with primary care in 1 week if not improving.     ED Prescriptions    Medication Sig Dispense Auth. Provider   cephALEXin (KEFLEX) 500 MG capsule Take 1 capsule (500 mg total) by mouth 2 (two) times daily. 14 capsule Lurene Shadow, PA-C     Controlled Substance Prescriptions [place] Controlled Substance Registry consulted? Not Applicable   Rolla Plate 07/28/17 1610

## 2017-07-29 ENCOUNTER — Ambulatory Visit: Payer: BC Managed Care – PPO | Admitting: Psychology

## 2017-07-29 DIAGNOSIS — F331 Major depressive disorder, recurrent, moderate: Secondary | ICD-10-CM

## 2017-07-30 ENCOUNTER — Ambulatory Visit: Payer: BC Managed Care – PPO | Admitting: Family Medicine

## 2017-07-31 ENCOUNTER — Ambulatory Visit: Payer: BC Managed Care – PPO | Admitting: Psychology

## 2017-07-31 ENCOUNTER — Telehealth: Payer: Self-pay | Admitting: Emergency Medicine

## 2017-07-31 DIAGNOSIS — F900 Attention-deficit hyperactivity disorder, predominantly inattentive type: Secondary | ICD-10-CM | POA: Diagnosis not present

## 2017-07-31 DIAGNOSIS — F341 Dysthymic disorder: Secondary | ICD-10-CM | POA: Diagnosis not present

## 2017-07-31 DIAGNOSIS — F330 Major depressive disorder, recurrent, mild: Secondary | ICD-10-CM | POA: Diagnosis not present

## 2017-07-31 LAB — URINE CULTURE
MICRO NUMBER:: 90640334
SPECIMEN QUALITY:: ADEQUATE

## 2017-07-31 NOTE — Telephone Encounter (Signed)
Drink lots of water and finish meds

## 2017-08-13 ENCOUNTER — Telehealth: Payer: Self-pay | Admitting: Family Medicine

## 2017-08-13 NOTE — Telephone Encounter (Signed)
Copied from CRM [phone]. Topic: Quick Communication - See Telephone Encounter >> Aug 13, 2017 11:39 AM Ninfa MeekerPoole, Bridgett H wrote: CRM for notification. See Telephone encounter for: 08/13/17.  Left voicemail appt needs to be rescheduled per pcp.

## 2017-08-14 ENCOUNTER — Ambulatory Visit: Payer: BC Managed Care – PPO | Admitting: Psychology

## 2017-08-14 DIAGNOSIS — F331 Major depressive disorder, recurrent, moderate: Secondary | ICD-10-CM

## 2017-08-28 ENCOUNTER — Ambulatory Visit: Payer: BC Managed Care – PPO | Admitting: Psychology

## 2017-08-31 ENCOUNTER — Ambulatory Visit (INDEPENDENT_AMBULATORY_CARE_PROVIDER_SITE_OTHER): Payer: BC Managed Care – PPO | Admitting: Psychology

## 2017-08-31 DIAGNOSIS — F3189 Other bipolar disorder: Secondary | ICD-10-CM | POA: Diagnosis not present

## 2017-08-31 DIAGNOSIS — F900 Attention-deficit hyperactivity disorder, predominantly inattentive type: Secondary | ICD-10-CM

## 2017-09-04 ENCOUNTER — Ambulatory Visit (INDEPENDENT_AMBULATORY_CARE_PROVIDER_SITE_OTHER): Payer: BC Managed Care – PPO | Admitting: Family Medicine

## 2017-09-04 ENCOUNTER — Encounter: Payer: Self-pay | Admitting: Family Medicine

## 2017-09-04 ENCOUNTER — Other Ambulatory Visit: Payer: Self-pay | Admitting: Family Medicine

## 2017-09-04 VITALS — BP 115/68 | HR 72 | Temp 98.1°F | Ht 64.0 in | Wt 134.4 lb

## 2017-09-04 DIAGNOSIS — F988 Other specified behavioral and emotional disorders with onset usually occurring in childhood and adolescence: Secondary | ICD-10-CM

## 2017-09-04 DIAGNOSIS — Z0000 Encounter for general adult medical examination without abnormal findings: Secondary | ICD-10-CM | POA: Diagnosis not present

## 2017-09-04 DIAGNOSIS — F3181 Bipolar II disorder: Secondary | ICD-10-CM | POA: Diagnosis not present

## 2017-09-04 DIAGNOSIS — Z1239 Encounter for other screening for malignant neoplasm of breast: Secondary | ICD-10-CM

## 2017-09-04 DIAGNOSIS — Z1231 Encounter for screening mammogram for malignant neoplasm of breast: Secondary | ICD-10-CM

## 2017-09-04 LAB — COMPREHENSIVE METABOLIC PANEL
ALT: 7 U/L (ref 0–35)
AST: 15 U/L (ref 0–37)
Albumin: 4.7 g/dL (ref 3.5–5.2)
Alkaline Phosphatase: 52 U/L (ref 39–117)
BUN: 12 mg/dL (ref 6–23)
CALCIUM: 9.7 mg/dL (ref 8.4–10.5)
CO2: 27 mEq/L (ref 19–32)
Chloride: 105 mEq/L (ref 96–112)
Creatinine, Ser: 0.73 mg/dL (ref 0.40–1.20)
GFR: 93.59 mL/min (ref >60.00)
GLUCOSE: 89 mg/dL (ref 70–99)
Potassium: 5.5 mEq/L — ABNORMAL HIGH (ref 3.5–5.1)
Sodium: 141 mEq/L (ref 135–145)
TOTAL PROTEIN: 7.6 g/dL (ref 6.0–8.3)
Total Bilirubin: 1.1 mg/dL (ref 0.2–1.2)

## 2017-09-04 LAB — CBC
HCT: 42.1 % (ref 36.0–46.0)
HEMOGLOBIN: 14.4 g/dL (ref 12.0–15.0)
MCHC: 34.1 g/dL (ref 30.0–36.0)
MCV: 94.8 fl (ref 78.0–100.0)
PLATELETS: 196 10*3/uL (ref 150.0–400.0)
RBC: 4.45 Mil/uL (ref 3.87–5.11)
RDW: 12.7 % (ref 11.5–15.5)
WBC: 4.2 10*3/uL (ref 4.0–10.5)

## 2017-09-04 LAB — LIPID PANEL
CHOLESTEROL: 171 mg/dL (ref 0–200)
HDL: 65.7 mg/dL (ref >39.00)
LDL CALC: 96 mg/dL (ref 0–99)
NonHDL: 105.7
Total CHOL/HDL Ratio: 3
Triglycerides: 49 mg/dL (ref 0.0–149.0)
VLDL: 9.8 mg/dL (ref 0.0–40.0)

## 2017-09-04 LAB — TSH: TSH: 1.56 u[IU]/mL (ref 0.35–4.50)

## 2017-09-04 MED ORDER — QUETIAPINE FUMARATE 100 MG PO TABS: ORAL_TABLET | ORAL | 1 refills | Status: DC

## 2017-09-04 NOTE — Patient Instructions (Signed)
Preventive Care 18-39 Years, Female Preventive care refers to lifestyle choices and visits with your health care provider that can promote health and wellness. What does preventive care include?  A yearly physical exam. This is also called an annual well check.  Dental exams once or twice a year.  Routine eye exams. Ask your health care provider how often you should have your eyes checked.  Personal lifestyle choices, including: ? Daily care of your teeth and gums. ? Regular physical activity. ? Eating a healthy diet. ? Avoiding tobacco and drug use. ? Limiting alcohol use. ? Practicing safe sex. ? Taking vitamin and mineral supplements as recommended by your health care provider. What happens during an annual well check? The services and screenings done by your health care provider during your annual well check will depend on your age, overall health, lifestyle risk factors, and family history of disease. Counseling Your health care provider may ask you questions about your:  Alcohol use.  Tobacco use.  Drug use.  Emotional well-being.  Home and relationship well-being.  Sexual activity.  Eating habits.  Work and work Statistician.  Method of birth control.  Menstrual cycle.  Pregnancy history.  Screening You may have the following tests or measurements:  Height, weight, and BMI.  Diabetes screening. This is done by checking your blood sugar (glucose) after you have not eaten for a while (fasting).  Blood pressure.  Lipid and cholesterol levels. These may be checked every 5 years starting at age 66.  Skin check.  Hepatitis C blood test.  Hepatitis B blood test.  Sexually transmitted disease (STD) testing.  BRCA-related cancer screening. This may be done if you have a family history of breast, ovarian, tubal, or peritoneal cancers.  Pelvic exam and Pap test. This may be done every 3 years starting at age 40. Starting at age 59, this may be done every 5  years if you have a Pap test in combination with an HPV test.  Discuss your test results, treatment options, and if necessary, the need for more tests with your health care provider. Vaccines Your health care provider may recommend certain vaccines, such as:  Influenza vaccine. This is recommended every year.  Tetanus, diphtheria, and acellular pertussis (Tdap, Td) vaccine. You may need a Td booster every 10 years.  Varicella vaccine. You may need this if you have not been vaccinated.  HPV vaccine. If you are 69 or younger, you may need three doses over 6 months.  Measles, mumps, and rubella (MMR) vaccine. You may need at least one dose of MMR. You may also need a second dose.  Pneumococcal 13-valent conjugate (PCV13) vaccine. You may need this if you have certain conditions and were not previously vaccinated.  Pneumococcal polysaccharide (PPSV23) vaccine. You may need one or two doses if you smoke cigarettes or if you have certain conditions.  Meningococcal vaccine. One dose is recommended if you are age 27-21 years and a first-year college student living in a residence [place], or if you have one of several medical conditions. You may also need additional booster doses.  Hepatitis A vaccine. You may need this if you have certain conditions or if you travel or work in places where you may be exposed to hepatitis A.  Hepatitis B vaccine. You may need this if you have certain conditions or if you travel or work in places where you may be exposed to hepatitis B.  Haemophilus influenzae type b (Hib) vaccine. You may need this if  you have certain risk factors.  Talk to your health care provider about which screenings and vaccines you need and how often you need them. This information is not intended to replace advice given to you by your health care provider. Make sure you discuss any questions you have with your health care provider. Document Released: 04/15/2001 Document Revised: 11/07/2015  Document Reviewed: 12/19/2014 Elsevier Interactive Patient Education  [employer].

## 2017-09-05 ENCOUNTER — Ambulatory Visit (HOSPITAL_BASED_OUTPATIENT_CLINIC_OR_DEPARTMENT_OTHER)
Admission: RE | Admit: 2017-09-05 | Discharge: 2017-09-05 | Disposition: A | Discharge status: Home / Self Care | Payer: BC Managed Care – PPO | Source: Ambulatory Visit | Attending: Family Medicine | Admitting: Family Medicine

## 2017-09-05 DIAGNOSIS — Z1239 Encounter for other screening for malignant neoplasm of breast: Secondary | ICD-10-CM

## 2017-09-05 DIAGNOSIS — Z1231 Encounter for screening mammogram for malignant neoplasm of breast: Secondary | ICD-10-CM | POA: Insufficient documentation

## 2017-09-06 DIAGNOSIS — F988 Other specified behavioral and emotional disorders with onset usually occurring in childhood and adolescence: Secondary | ICD-10-CM | POA: Insufficient documentation

## 2017-09-06 DIAGNOSIS — F3181 Bipolar II disorder: Secondary | ICD-10-CM | POA: Insufficient documentation

## 2017-09-06 NOTE — Progress Notes (Signed)
Subjective:    Patient ID: Valerie Ruiz, female    DOB: 1976-01-23, 42 y.o.   MRN: [id number]  Chief Complaint  Patient presents with  . Annual Exam    Pt here for CPE. Last PAP: End of 2018.     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 She is comfortable with the diagnosis and would like to pursue treatment. She is interested in referral to psychiatry. She notes anhedonia but no suicidal or homicidal ideation. Denies CP/palp/SOB/HA/congestion/fevers/GI or GU c/o. Taking meds as prescribed  Past Medical History:  Diagnosis Date  . Acute bronchitis 08/28/2015  . Allergic rhinitis   . Anemia 05/08/2016  . Asthma   . Chronic headaches   . Depression   . Glaucoma   . History of ovulatory pain 09/10/2014  . LLQ pain 09/10/2014  . Preventative health care 12/24/2014    Past Surgical History:  Procedure Laterality Date  . ANKLE SURGERY     Broken ankle    Family History  Problem Relation Age of Onset  . Hyperthyroidism Mother   . Breast cancer Paternal Grandmother   . Cancer Paternal Grandmother        metastatic breast with mets to bone  . Gout Father   . Heart disease Maternal Grandmother 56       MI  . Stroke Maternal Grandfather     Social History   Socioeconomic History  . Marital status: Married    Spouse name: Not on file  . Number of children: Not on file  . Years of education: Not on file  . Highest education level: Not on file  Occupational History  . Not on file  Social Needs  . Financial resource strain: Not on file  . Food insecurity:    Worry: Not on file    Inability: Not on file  . Transportation needs:    Medical: Not on file    Non-medical: Not on file  Tobacco Use  . Smoking status: Never Smoker  . Smokeless tobacco: Never Used  Substance and Sexual Activity  . Alcohol use: No    Alcohol/week: 1.8 oz    Types: 3  Standard drinks or equivalent per week  . Drug use: No  . 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 Physical activity:    Days per week: Not on file    Minutes per session: Not on file  . Stress: Not on file  Relationships  . Social connections:    Talks on phone: Not on file    Gets together: Not on file    Attends religious service: Not on file    Active member of club or organization: Not on file    Attends meetings of clubs or organizations: Not on file    Relationship status: Not on file  . Intimate partner violence:    Fear of current or ex partner: Not on file    Emotionally abused: Not on file    Physically abused: Not on file    Forced sexual activity: Not on file  Other Topics Concern  . Not on file  Social History Narrative  . Not on file    Outpatient Medications Prior to Visit  Medication Sig Dispense Refill  . metoprolol tartrate (LOPRESSOR) 25  MG tablet Take half tab twice daily 60 tablet 3  . cephALEXin (KEFLEX) 500 MG capsule Take 1 capsule (500 mg total) by mouth 2 (two) times daily. (Patient not taking: Reported on 09/04/2017) 14 capsule 0   No facility-administered medications prior to visit.     No Known Allergies  Review of Systems  Constitutional: Negative for chills, fever and malaise/fatigue.  HENT: Negative for congestion and hearing loss.   Eyes: Negative for discharge.  Respiratory: Negative for cough, sputum production and shortness of breath.   Cardiovascular: Negative for chest pain, palpitations and leg swelling.  Gastrointestinal: Negative for abdominal pain, blood in stool, constipation, diarrhea, heartburn, nausea and vomiting.  Genitourinary: Negative for dysuria, frequency, hematuria and urgency.  Musculoskeletal: Negative for back pain, falls and myalgias.  Skin: Negative for rash.  Neurological: Negative for  dizziness, sensory change, loss of consciousness, weakness and headaches.  Endo/Heme/Allergies: Negative for environmental allergies. Does not bruise/bleed easily.  Psychiatric/Behavioral: Positive for depression. Negative for suicidal ideas. The patient is nervous/anxious. The patient does not have insomnia.        Objective:    Physical Exam  Constitutional: She is oriented to person, place, and time. She appears well-developed and well-nourished. No distress.  HENT:  Head: Normocephalic and atraumatic.  Eyes: Conjunctivae are normal.  Neck: Neck supple. No thyromegaly present.  Cardiovascular: Normal rate, regular rhythm and normal heart sounds.  No murmur heard. Pulmonary/Chest: Effort normal and breath sounds normal. No respiratory distress.  Abdominal: Soft. Bowel sounds are normal. She exhibits no distension and no mass. There is no tenderness.  Musculoskeletal: She exhibits no edema.  Lymphadenopathy:    She has no cervical adenopathy.  Neurological: She is alert and oriented to person, place, and time.  Skin: Skin is warm and dry.  Psychiatric: She has a normal mood and affect. Her behavior is normal.    BP 115/68 (BP Location: Left Arm, Patient Position: Sitting, Cuff Size: Normal)   Pulse 72   Temp 98.1 F (36.7 C) (Oral)   Ht 5\' 4"  (1.626 m)   Wt 134 lb 6.4 oz (61 kg)   LMP 08/12/2017   SpO2 100%   BMI 23.07 kg/m  Wt Readings from Last 3 Encounters:  09/04/17 134 lb 6.4 oz (61 kg)  07/28/17 135 lb (61.2 kg)  02/16/17 132 lb (59.9 kg)     Lab Results  Component Value Date   WBC 4.2 09/04/2017   HGB 14.4 09/04/2017   HCT 42.1 09/04/2017   PLT 196.0 09/04/2017   GLUCOSE 89 09/04/2017   CHOL 171 09/04/2017   TRIG 49.0 09/04/2017   HDL 65.70 09/04/2017   LDLCALC 96 09/04/2017   ALT 7 09/04/2017   AST 15 09/04/2017   NA 141 09/04/2017   K 5.5 (H) 09/04/2017   CL 105 09/04/2017   CREATININE 0.73 09/04/2017   BUN 12 09/04/2017   CO2 27 09/04/2017    TSH 1.56 09/04/2017    Lab Results  Component Value Date   TSH 1.56 09/04/2017   Lab Results  Component Value Date   WBC 4.2 09/04/2017   HGB 14.4 09/04/2017   HCT 42.1 09/04/2017   MCV 94.8 09/04/2017   PLT 196.0 09/04/2017   Lab Results  Component Value Date   NA 141 09/04/2017   K 5.5 (H) 09/04/2017   CO2 27 09/04/2017   GLUCOSE 89 09/04/2017   BUN 12 09/04/2017   CREATININE 0.73 09/04/2017   BILITOT 1.1 09/04/2017   ALKPHOS  52 09/04/2017   AST 15 09/04/2017   ALT 7 09/04/2017   PROT 7.6 09/04/2017   ALBUMIN 4.7 09/04/2017   CALCIUM 9.7 09/04/2017   GFR 93.59 09/04/2017   Lab Results  Component Value Date   CHOL 171 09/04/2017   Lab Results  Component Value Date   HDL 65.70 09/04/2017   Lab Results  Component Value Date   LDLCALC 96 09/04/2017   Lab Results  Component Value Date   TRIG 49.0 09/04/2017   Lab Results  Component Value Date   CHOLHDL 3 09/04/2017   No results found for: HGBA1C     Assessment & Plan:   Problem List Items Addressed This Visit    Preventative health care    Patient encouraged to maintain heart healthy diet, regular exercise, adequate sleep. Consider daily probiotics. Take medications as prescribed. Given and reviewed copy of ACP documents from [employer] and encouraged to complete and return. Labs ordered. MGM ordered.       Relevant Orders   CBC (Completed)   Comprehensive metabolic panel (Completed)   Lipid panel (Completed)   TSH (Completed)   Attention deficit disorder (ADD)   Bipolar 2 disorder (HCC) - Primary    Has been evaluated by behavioral health sees [facility] counselor Nilda Riggs and had an ADD and BH evaluatioin with Dr Forbes Cellar and her diagnosis was confirmed. She denies being in any immediate danger but does accept the diagnosis. She is referred to psychiatry for further evaluation and treatments.       Relevant Orders   Ambulatory referral to Psychiatry   Comprehensive metabolic panel  (Completed)    Other Visit Diagnoses    Breast cancer screening       Relevant Orders   MM DIAG BREAST TOMO BILATERAL      I have discontinued Valerie Ruiz's cephALEXin. I am also having her start on QUEtiapine. Additionally, I am having her maintain her metoprolol tartrate.  Meds ordered this encounter  Medications  . QUEtiapine (SEROQUEL) 100 MG tablet    Sig: 1/2 tab po qhs X 1d then 1 tab po qhs X 1d then 2 tab po qhs  1d then 3 tabs po qhs daily    Dispense:  90 tablet    Refill:  1     Danise Edge, MD

## 2017-09-06 NOTE — Assessment & Plan Note (Signed)
Patient encouraged to maintain heart healthy diet, regular exercise, adequate sleep. Consider daily probiotics. Take medications as prescribed. Given and reviewed copy of ACP documents from [employer] and encouraged to complete and return. Labs ordered. MGM ordered.

## 2017-09-06 NOTE — Assessment & Plan Note (Signed)
Has been evaluated by behavioral health sees [facility] counselor Nilda Riggserry Bauert and had an ADD and BH evaluatioin with Dr Forbes CellarAllison Bray and her diagnosis was confirmed. She denies being in any immediate danger but does accept the diagnosis. She is referred to psychiatry for further evaluation and treatments.

## 2017-09-15 ENCOUNTER — Encounter: Payer: Self-pay | Admitting: Family Medicine

## 2017-09-17 ENCOUNTER — Other Ambulatory Visit: Payer: Self-pay | Admitting: Family Medicine

## 2017-09-17 MED ORDER — QUETIAPINE FUMARATE 100 MG PO TABS: 100.0000 mg | ORAL_TABLET | Freq: Every day | ORAL | 1 refills | Status: DC

## 2017-09-17 MED ORDER — FLUOXETINE HCL 10 MG PO TABS: 10.0000 mg | ORAL_TABLET | Freq: Every day | ORAL | 1 refills | Status: DC

## 2017-09-24 ENCOUNTER — Encounter: Payer: Self-pay | Admitting: Family Medicine

## 2017-09-24 DIAGNOSIS — E875 Hyperkalemia: Secondary | ICD-10-CM

## 2017-09-25 ENCOUNTER — Other Ambulatory Visit (INDEPENDENT_AMBULATORY_CARE_PROVIDER_SITE_OTHER): Payer: BC Managed Care – PPO

## 2017-09-25 ENCOUNTER — Ambulatory Visit (INDEPENDENT_AMBULATORY_CARE_PROVIDER_SITE_OTHER): Payer: BC Managed Care – PPO | Admitting: Psychology

## 2017-09-25 DIAGNOSIS — E875 Hyperkalemia: Secondary | ICD-10-CM | POA: Diagnosis not present

## 2017-09-25 DIAGNOSIS — F331 Major depressive disorder, recurrent, moderate: Secondary | ICD-10-CM | POA: Diagnosis not present

## 2017-09-25 NOTE — Addendum Note (Signed)
Addended by: Harley AltoPRICE, Somalia Segler M on: 09/25/2017 03:36 PM   Modules accepted: Orders

## 2017-09-25 NOTE — Addendum Note (Signed)
Addended by: Harley AltoPRICE, Mylia Pondexter M on: 09/25/2017 03:38 PM   Modules accepted: Orders

## 2017-09-26 LAB — COMPREHENSIVE METABOLIC PANEL
AG Ratio: 1.6 (calc) (ref 1.0–2.5)
ALBUMIN MSPROF: 4.6 g/dL (ref 3.6–5.1)
ALT: 13 U/L (ref 6–29)
AST: 18 U/L (ref 10–30)
Alkaline phosphatase (APISO): 54 U/L (ref 33–115)
BILIRUBIN TOTAL: 0.7 mg/dL (ref 0.2–1.2)
BUN: 11 mg/dL (ref 7–25)
CO2: 26 mmol/L (ref 20–32)
Calcium: 9.2 mg/dL (ref 8.6–10.2)
Chloride: 101 mmol/L (ref 98–110)
Creat: 0.67 mg/dL (ref 0.50–1.10)
Globulin: 2.8 g/dL (calc) (ref 1.9–3.7)
Glucose, Bld: 84 mg/dL (ref 65–99)
POTASSIUM: 3.9 mmol/L (ref 3.5–5.3)
SODIUM: 139 mmol/L (ref 135–146)
TOTAL PROTEIN: 7.4 g/dL (ref 6.1–8.1)

## 2017-09-28 ENCOUNTER — Encounter: Payer: Self-pay | Admitting: Family Medicine

## 2017-10-06 ENCOUNTER — Ambulatory Visit: Payer: BC Managed Care – PPO | Admitting: Psychology

## 2017-10-09 ENCOUNTER — Ambulatory Visit: Payer: BC Managed Care – PPO | Admitting: Psychology

## 2017-10-20 ENCOUNTER — Encounter: Payer: Self-pay | Admitting: Family Medicine

## 2017-10-23 ENCOUNTER — Ambulatory Visit: Payer: BC Managed Care – PPO | Admitting: Psychology

## 2017-10-23 DIAGNOSIS — F331 Major depressive disorder, recurrent, moderate: Secondary | ICD-10-CM | POA: Diagnosis not present

## 2017-10-29 ENCOUNTER — Ambulatory Visit (INDEPENDENT_AMBULATORY_CARE_PROVIDER_SITE_OTHER): Payer: BC Managed Care – PPO | Admitting: Family Medicine

## 2017-10-29 ENCOUNTER — Encounter: Payer: Self-pay | Admitting: Family Medicine

## 2017-10-29 DIAGNOSIS — F3181 Bipolar II disorder: Secondary | ICD-10-CM

## 2017-10-29 DIAGNOSIS — R002 Palpitations: Secondary | ICD-10-CM | POA: Diagnosis not present

## 2017-10-29 NOTE — Patient Instructions (Signed)

## 2017-10-29 NOTE — Progress Notes (Signed)
Subjective:  I acted as a Neurosurgeon for Dr. Abner Greenspan. Valerie Ruiz, [place]  Patient ID: Valerie Ruiz, female    DOB: 01/24/1976, 42 y.o.   MRN: [id number]  No chief complaint on file.   HPI  Patient is in today for 2 month follow up and she reports feeling much better. Her anxiety and anhedonia are much better on he Seroquel at 100 mg. She didd not tolerate the 300 mg dose with somnolence but is much calmer with the 100 mg dose. No recent febrile illness or hospitalization. Denies CP/palp/SOB/HA/congestion/fevers/GI or GU c/o. Taking meds as prescribed  Patient Care Team: [name], MD as PCP - General (Family Medicine)   Past Medical History:  Diagnosis Date  . Acute bronchitis 08/28/2015  . Allergic rhinitis   . Anemia 05/08/2016  . Asthma   . Chronic headaches   . Depression   . Glaucoma   . History of ovulatory pain 09/10/2014  . LLQ pain 09/10/2014  . Preventative health care 12/24/2014    Past Surgical History:  Procedure Laterality Date  . ANKLE SURGERY     Broken ankle    Family History  Problem Relation Age of Onset  . Hyperthyroidism Mother   . Breast cancer Paternal Grandmother   . Cancer Paternal Grandmother        metastatic breast with mets to bone  . Gout Father   . Heart disease Maternal Grandmother 59       MI  . Stroke Maternal Grandfather     Social History   Socioeconomic History  . Marital status: Married    Spouse name: Not on file  . Number of children: Not on file  . Years of education: Not on file  . Highest education level: Not on file  Occupational History  . Not on file  Social Needs  . Financial resource strain: Not on file  . Food insecurity:    Worry: Not on file    Inability: Not on file  . Transportation needs:    Medical: Not on file    Non-medical: Not on file  Tobacco Use  . Smoking status: Never Smoker  . Smokeless tobacco: Never Used  Substance and Sexual Activity  . Alcohol use: No    Alcohol/week: 3.0 standard  drinks    Types: 3 Standard drinks or equivalent per week  . Drug use: No  . 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 Physical activity:    Days per week: Not on file    Minutes per session: Not on file  . Stress: Not on file  Relationships  . Social connections:    Talks on phone: Not on file    Gets together: Not on file    Attends religious service: Not on file    Active member of club or organization: Not on file    Attends meetings of clubs or organizations: Not on file    Relationship status: Not on file  . Intimate partner violence:    Fear of current or ex partner: Not on file    Emotionally abused: Not on file    Physically abused: Not on file    Forced sexual activity: Not on file  Other Topics Concern  . Not on file  Social History Narrative  . Not on file    Outpatient Medications Prior to Visit  Medication  Sig Dispense Refill  . FLUoxetine (PROZAC) 10 MG tablet Take 1 tablet (10 mg total) by mouth daily. 30 tablet 1  . metoprolol tartrate (LOPRESSOR) 25 MG tablet Take half tab twice daily 60 tablet 3  . QUEtiapine (SEROQUEL) 100 MG tablet Take 1 tablet (100 mg total) by mouth at bedtime. 90 tablet 1   No facility-administered medications prior to visit.     No Known Allergies  Review of Systems  Constitutional: Negative for fever and malaise/fatigue.  HENT: Negative for congestion.   Eyes: Negative for blurred vision.  Respiratory: Negative for shortness of breath.   Cardiovascular: Negative for chest pain, palpitations and leg swelling.  Gastrointestinal: Negative for abdominal pain, blood in stool and nausea.  Genitourinary: Negative for dysuria and frequency.  Musculoskeletal: Negative for falls.  Skin: Negative for rash.  Neurological: Negative for dizziness, loss of consciousness and headaches.  Endo/Heme/Allergies: Negative for  environmental allergies.  Psychiatric/Behavioral: Negative for depression. The patient is not nervous/anxious.        Objective:    Physical Exam  BP 103/63 (BP Location: Left Arm, Patient Position: Sitting, Cuff Size: Normal)   Pulse 74   Temp (!) 97.5 F (36.4 C) (Oral)   Resp 18   Wt 139 lb 3.2 oz (63.1 kg)   SpO2 99%   BMI 23.89 kg/m  Wt Readings from Last 3 Encounters:  10/29/17 139 lb 3.2 oz (63.1 kg)  09/04/17 134 lb 6.4 oz (61 kg)  07/28/17 135 lb (61.2 kg)   BP Readings from Last 3 Encounters:  10/29/17 103/63  09/04/17 115/68  07/28/17 115/77     Immunization History  Administered Date(s) Administered  . Influenza Whole 12/19/2008, 12/04/2009  . Influenza-Unspecified 01/03/2017  . Tdap 11/23/2015    Health Maintenance  Topic Date Due  . HIV Screening  02/21/1992  . INFLUENZA VACCINE  10/01/2017  . MAMMOGRAM  09/06/2018  . PAP SMEAR  01/03/2020  . TETANUS/TDAP  11/22/2025    Lab Results  Component Value Date   WBC 4.2 09/04/2017   HGB 14.4 09/04/2017   HCT 42.1 09/04/2017   PLT 196.0 09/04/2017   GLUCOSE 84 09/25/2017   CHOL 171 09/04/2017   TRIG 49.0 09/04/2017   HDL 65.70 09/04/2017   LDLCALC 96 09/04/2017   ALT 13 09/25/2017   AST 18 09/25/2017   NA 139 09/25/2017   K 3.9 09/25/2017   CL 101 09/25/2017   CREATININE 0.67 09/25/2017   BUN 11 09/25/2017   CO2 26 09/25/2017   TSH 1.56 09/04/2017    Lab Results  Component Value Date   TSH 1.56 09/04/2017   Lab Results  Component Value Date   WBC 4.2 09/04/2017   HGB 14.4 09/04/2017   HCT 42.1 09/04/2017   MCV 94.8 09/04/2017   PLT 196.0 09/04/2017   Lab Results  Component Value Date   NA 139 09/25/2017   K 3.9 09/25/2017   CO2 26 09/25/2017   GLUCOSE 84 09/25/2017   BUN 11 09/25/2017   CREATININE 0.67 09/25/2017   BILITOT 0.7 09/25/2017   ALKPHOS 52 09/04/2017   AST 18 09/25/2017   ALT 13 09/25/2017   PROT 7.4 09/25/2017   ALBUMIN 4.7 09/04/2017   CALCIUM 9.2  09/25/2017   GFR 93.59 09/04/2017   Lab Results  Component Value Date   CHOL 171 09/04/2017   Lab Results  Component Value Date   HDL 65.70 09/04/2017   Lab Results  Component Value Date   LDLCALC 96  09/04/2017   Lab Results  Component Value Date   TRIG 49.0 09/04/2017   Lab Results  Component Value Date   CHOLHDL 3 09/04/2017   No results found for: HGBA1C       Assessment & Plan:   Problem List Items Addressed This Visit    PALPITATIONS, OCCASIONAL    Well controlled on Metoprolol      Bipolar 2 disorder (HCC)    Doing much better with the addition of Seroquel. She did not tolerate the 300 mg dose but is doing well on 100 mg she is still awaiting her appointment with psychiatry.          I am having Valerie Ruiz maintain her metoprolol tartrate, FLUoxetine, and QUEtiapine.  No orders of the defined types were placed in this encounter.   Marland Kitchen.CMA served as Neurosurgeonscribe during this visit. History, Physical and Plan performed by medical provider. Documentation and orders reviewed and attested to.  Danise EdgeStacey Laurier Jasperson, MD

## 2017-11-02 NOTE — Assessment & Plan Note (Signed)
Well controlled on Metoprolol

## 2017-11-02 NOTE — Assessment & Plan Note (Signed)
Doing much better with the addition of Seroquel. She did not tolerate the 300 mg dose but is doing well on 100 mg she is still awaiting her appointment with psychiatry.

## 2017-11-05 ENCOUNTER — Other Ambulatory Visit: Payer: Self-pay | Admitting: Family Medicine

## 2017-11-06 ENCOUNTER — Ambulatory Visit: Payer: BC Managed Care – PPO | Admitting: Psychology

## 2017-11-20 ENCOUNTER — Ambulatory Visit: Payer: BC Managed Care – PPO | Admitting: Psychology

## 2017-12-04 ENCOUNTER — Ambulatory Visit: Payer: BC Managed Care – PPO | Admitting: Psychology

## 2018-01-07 ENCOUNTER — Encounter: Payer: Self-pay | Admitting: Family Medicine

## 2018-01-07 ENCOUNTER — Ambulatory Visit (INDEPENDENT_AMBULATORY_CARE_PROVIDER_SITE_OTHER): Payer: BC Managed Care – PPO | Admitting: Family Medicine

## 2018-01-07 VITALS — BP 113/59 | HR 75 | Ht 62.0 in | Wt 143.0 lb

## 2018-01-07 DIAGNOSIS — Z113 Encounter for screening for infections with a predominantly sexual mode of transmission: Secondary | ICD-10-CM | POA: Diagnosis not present

## 2018-01-07 DIAGNOSIS — Z01419 Encounter for gynecological examination (general) (routine) without abnormal findings: Secondary | ICD-10-CM | POA: Diagnosis not present

## 2018-01-07 DIAGNOSIS — Z124 Encounter for screening for malignant neoplasm of cervix: Secondary | ICD-10-CM | POA: Diagnosis not present

## 2018-01-07 DIAGNOSIS — D069 Carcinoma in situ of cervix, unspecified: Secondary | ICD-10-CM

## 2018-01-07 NOTE — Progress Notes (Signed)
GYNECOLOGY ANNUAL PREVENTATIVE CARE ENCOUNTER NOTE  Subjective:   Valerie Ruiz is a 42 y.o. G0P0000 female here for a routine annual gynecologic exam.  Current complaints: none.   Denies abnormal vaginal bleeding, discharge, pelvic pain, problems with intercourse or other gynecologic concerns.    Gynecologic History Patient's last menstrual period was 12/11/2017. Patient is sexually active  Contraception: n/a - same sex partner Last Pap: 01/2017. 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 Results were: normal  Obstetric History OB History  Gravida Para Term Preterm AB Living  0 0 0 0 0 0  SAB TAB Ectopic Multiple Live Births  0 0 0 0 0    Past Medical History:  Diagnosis Date  . Acute bronchitis 08/28/2015  . Allergic rhinitis   . Anemia 05/08/2016  . Asthma   . Chronic headaches   . Depression   . Glaucoma   . History of ovulatory pain 09/10/2014  . LLQ pain 09/10/2014  . Preventative health care 12/24/2014    Past Surgical History:  Procedure Laterality Date  . ANKLE SURGERY     Broken ankle    Current Outpatient Medications on File Prior to Visit  Medication Sig Dispense Refill  . FLUoxetine (PROZAC) 10 MG tablet TAKE 1 TABLET BY MOUTH EVERY DAY 30 tablet 1  . metoprolol tartrate (LOPRESSOR) 25 MG tablet Take half tab twice daily 60 tablet 3  . QUEtiapine (SEROQUEL) 100 MG tablet Take 1 tablet (100 mg total) by mouth at bedtime. 90 tablet 1   No current facility-administered medications on file prior to visit.     No Known Allergies  Social History   Socioeconomic History  . Marital status: Married    Spouse name: Not on file  . Number of children: Not on file  . Years of education: Not on file  . Highest education level: Not on file  Occupational History  . Not on file  Social Needs  . Financial resource strain: Not on file  . Food insecurity:    Worry: Not on file    Inability: Not on file  .  Transportation needs:    Medical: Not on file    Non-medical: Not on file  Tobacco Use  . Smoking status: Never Smoker  . Smokeless tobacco: Never Used  Substance and Sexual Activity  . Alcohol use: No    Alcohol/week: 3.0 standard drinks    Types: 3 Standard drinks or equivalent per week  . Drug use: No  . 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 Physical activity:    Days per week: Not on file    Minutes per session: Not on file  . Stress: Not on file  Relationships  . Social connections:    Talks on phone: Not on file    Gets together: Not on file    Attends religious service: Not on file    Active member of club or organization: Not on file    Attends meetings of clubs or organizations: Not on file    Relationship status: Not on file  . Intimate partner violence:    Fear of current or ex partner: Not on file    Emotionally abused: Not on file    Physically abused: Not on file    Forced sexual activity: Not on file  Other Topics Concern  .  Not on file  Social History Narrative  . Not on file    Family History  Problem Relation Age of Onset  . Hyperthyroidism Mother   . Breast cancer Paternal Grandmother   . Cancer Paternal Grandmother        metastatic breast with mets to bone  . Gout Father   . Heart disease Maternal Grandmother 11       MI  . Stroke Maternal Grandfather     The following portions of the patient's history were reviewed and updated as appropriate: allergies, current medications, past family history, past medical history, past social history, past surgical history and problem list.  Review of Systems Pertinent items are noted in HPI. Pertinent items noted in HPI and remainder of comprehensive ROS otherwise negative.   Objective:  BP (!) 113/59   Pulse 75   Ht 5\' 2"  (1.575 m)   Wt 143 lb (64.9 kg)   LMP 12/11/2017   BMI  26.16 kg/m  CONSTITUTIONAL: Well-developed, well-nourished female in no acute distress.  HENT:  Normocephalic, atraumatic, External right and left ear normal. Oropharynx is clear and moist EYES: Conjunctivae and EOM are normal. Pupils are equal, round, and reactive to light. No scleral icterus.  NECK: Normal range of motion, supple, no masses.  Normal thyroid.   CARDIOVASCULAR: Normal heart rate noted, regular rhythm RESPIRATORY: Clear to auscultation bilaterally. Effort and breath sounds normal, no problems with respiration noted. BREASTS: Symmetric in size. No masses, skin changes, nipple drainage, or lymphadenopathy. ABDOMEN: Soft, normal bowel sounds, no distention noted.  No tenderness, rebound or guarding.  PELVIC: Normal appearing external genitalia; normal appearing vaginal mucosa and cervix.  No abnormal discharge noted.  Normal uterine size, no other palpable masses, no uterine or adnexal tenderness. MUSCULOSKELETAL: Normal range of motion. No tenderness.  No cyanosis, clubbing, or edema.  2+ distal pulses. SKIN: Skin is warm and dry. No rash noted. Not diaphoretic. No erythema. No pallor. NEUROLOGIC: Alert and oriented to person, place, and time. Normal reflexes, muscle tone coordination. No cranial nerve deficit noted. PSYCHIATRIC: Normal mood and affect. Normal behavior. Normal judgment and thought content.  Assessment:  Annual gynecologic examination with pap smear   Plan:  1. Well Woman Exam Will follow up results of pap smear and manage accordingly. Mammogram results reviewed. STD testing discussed. Patient declined testing - Cytology - PAP( [place])   Routine preventative health maintenance measures emphasized. Please refer to After Visit Summary for other counseling recommendations.    Candelaria Celeste, DO [facility]

## 2018-01-11 LAB — CYTOLOGY - PAP
Diagnosis: NEGATIVE
HPV: NOT DETECTED

## 2018-01-16 ENCOUNTER — Other Ambulatory Visit: Payer: Self-pay | Admitting: Family Medicine

## 2018-02-01 ENCOUNTER — Other Ambulatory Visit: Payer: Self-pay | Admitting: Family Medicine

## 2018-04-01 ENCOUNTER — Ambulatory Visit: Payer: Self-pay | Admitting: Family Medicine

## 2018-04-26 ENCOUNTER — Encounter: Payer: Self-pay | Admitting: Family Medicine

## 2018-04-26 ENCOUNTER — Ambulatory Visit: Payer: BC Managed Care – PPO | Admitting: Family Medicine

## 2018-04-26 ENCOUNTER — Telehealth: Payer: Self-pay | Admitting: *Deleted

## 2018-04-26 ENCOUNTER — Other Ambulatory Visit (INDEPENDENT_AMBULATORY_CARE_PROVIDER_SITE_OTHER): Payer: BC Managed Care – PPO

## 2018-04-26 VITALS — BP 90/58 | HR 88 | Temp 98.4°F | Resp 18 | Ht 62.0 in | Wt 145.6 lb

## 2018-04-26 DIAGNOSIS — F3181 Bipolar II disorder: Secondary | ICD-10-CM | POA: Diagnosis not present

## 2018-04-26 DIAGNOSIS — E875 Hyperkalemia: Secondary | ICD-10-CM

## 2018-04-26 DIAGNOSIS — R002 Palpitations: Secondary | ICD-10-CM | POA: Diagnosis not present

## 2018-04-26 DIAGNOSIS — D649 Anemia, unspecified: Secondary | ICD-10-CM | POA: Diagnosis not present

## 2018-04-26 LAB — CBC
HEMATOCRIT: 41.4 % (ref 36.0–46.0)
Hemoglobin: 14.2 g/dL (ref 12.0–15.0)
MCHC: 34.3 g/dL (ref 30.0–36.0)
MCV: 94.6 fl (ref 78.0–100.0)
PLATELETS: 190 10*3/uL (ref 150.0–400.0)
RBC: 4.38 Mil/uL (ref 3.87–5.11)
RDW: 12.4 % (ref 11.5–15.5)
WBC: 4.4 10*3/uL (ref 4.0–10.5)

## 2018-04-26 LAB — COMPREHENSIVE METABOLIC PANEL
ALBUMIN: 4.5 g/dL (ref 3.5–5.2)
ALT: 7 U/L (ref 0–35)
AST: 13 U/L (ref 0–37)
Alkaline Phosphatase: 51 U/L (ref 39–117)
BUN: 15 mg/dL (ref 6–23)
CALCIUM: 9.6 mg/dL (ref 8.4–10.5)
CHLORIDE: 107 meq/L (ref 96–112)
CO2: 28 mEq/L (ref 19–32)
CREATININE: 0.76 mg/dL (ref 0.40–1.20)
GFR: 83.79 mL/min (ref >60.00)
Glucose, Bld: 87 mg/dL (ref 70–99)
Potassium: 6.4 mEq/L (ref 3.5–5.1)
Sodium: 141 mEq/L (ref 135–145)
TOTAL PROTEIN: 7.1 g/dL (ref 6.0–8.3)
Total Bilirubin: 0.7 mg/dL (ref 0.2–1.2)

## 2018-04-26 NOTE — Patient Instructions (Signed)
Palpitations  Palpitations are feelings that your heartbeat is irregular or is faster than normal. It may feel like your heart is fluttering or skipping a beat. Palpitations are usually not a serious problem. They may be caused by many things, including smoking, caffeine, alcohol, stress, and certain medicines or drugs. Most causes of palpitations are not serious. However, some palpitations can be a sign of a serious problem. You may need further tests to rule out serious medical problems.  Follow these instructions at home:         Pay attention to any changes in your condition. Take these actions to help manage your symptoms:  Eating and drinking  Avoid foods and drinks that may cause palpitations. These may include:  Caffeinated coffee, tea, soft drinks, diet pills, and energy drinks.  Chocolate.  Alcohol.  Lifestyle  Take steps to reduce your stress and anxiety. Things that can help you relax include:  Yoga.  Mind-body activities, such as deep breathing, meditation, or using words and images to create positive thoughts (guided imagery).  Physical activity, such as swimming, jogging, or walking. Tell your health care provider if your palpitations increase with activity. If you have chest pain or shortness of breath with activity, do not continue the activity until you are seen by your health care provider.  Biofeedback. This is a method that helps you learn to use your mind to control things in your body, such as your heartbeat.  Do not use drugs, including cocaine or ecstasy. Do not use marijuana.  Get plenty of rest and sleep. Keep a regular bed time.  General instructions  Take over-the-counter and prescription medicines only as told by your health care provider.  Do not use any products that contain nicotine or tobacco, such as cigarettes and e-cigarettes. If you need help quitting, ask your health care provider.  Keep all follow-up visits as told by your health care provider. This is important. These may  include visits for further testing if palpitations do not go away or get worse.  Contact a health care provider if you:  Continue to have a fast or irregular heartbeat after 24 hours.  Notice that your palpitations occur more often.  Get help right away if you:  Have chest pain or shortness of breath.  Have a severe headache.  Feel dizzy or you faint.  Summary  Palpitations are feelings that your heartbeat is irregular or is faster than normal. It may feel like your heart is fluttering or skipping a beat.  Palpitations may be caused by many things, including smoking, caffeine, alcohol, stress, certain medicines, and drugs.  Although most causes of palpitations are not serious, some causes can be a sign of a serious medical problem.  Get help right away if you faint or have chest pain, shortness of breath, a severe headache, or dizziness.  This information is not intended to replace advice given to you by your health care provider. Make sure you discuss any questions you have with your health care provider.  Document Released: 02/15/2000 Document Revised: 04/01/2017 Document Reviewed: 04/01/2017  Elsevier Interactive Patient Education  2019 [employer]

## 2018-04-26 NOTE — Assessment & Plan Note (Signed)
Following with Dr Evelene Croon and has been placed on Lamictal and continued on Seroquel. Reports it is working well

## 2018-04-26 NOTE — Assessment & Plan Note (Signed)
Well controlled with Metorpolol, check labs

## 2018-04-26 NOTE — Telephone Encounter (Signed)
Spoke with patient about results. She stated she will come back in today to have repeat labs done  Made appt for 3pm today

## 2018-04-26 NOTE — Progress Notes (Signed)
Subjective:    Patient ID: Valerie Ruiz, female    DOB: [birth year], 43 y.o.   MRN: [id number]  No chief complaint on file.   HPI Patient is in today for follow up. She feels well. She is following with Dr Evelene Croon of psychiatry now and they have switched her from Prozac to Lamictal and maintained the Seroquel with good results. Her palpitations are well controlled on the metoprolol. No recent febrile illness or acute concerns. Denies CP/palp/SOB/HA/congestion/fevers/GI or GU c/o. Taking meds as prescribed  Past Medical History:  Diagnosis Date  . Acute bronchitis 08/28/2015  . Allergic rhinitis   . Anemia 05/08/2016  . Asthma   . Chronic headaches   . Depression   . Glaucoma   . History of ovulatory pain 09/10/2014  . LLQ pain 09/10/2014  . Preventative health care 12/24/2014    Past Surgical History:  Procedure Laterality Date  . ANKLE SURGERY     Broken ankle    Family History  Problem Relation Age of Onset  . Hyperthyroidism Mother   . Breast cancer Paternal Grandmother   . Cancer Paternal Grandmother        metastatic breast with mets to bone  . Gout Father   . Heart disease Maternal Grandmother 1       MI  . Stroke Maternal Grandfather     Social History   Socioeconomic History  . Marital status: Married    Spouse name: Not on file  . Number of children: Not on file  . Years of education: Not on file  . Highest education level: Not on file  Occupational History  . Not on file  Social Needs  . Financial resource strain: Not on file  . Food insecurity:    Worry: Not on file    Inability: Not on file  . Transportation needs:    Medical: Not on file    Non-medical: Not on file  Tobacco Use  . Smoking status: Never Smoker  . Smokeless tobacco: Never Used  Substance and Sexual Activity  . Alcohol use: No    Alcohol/week: 3.0 standard drinks    Types: 3 Standard drinks or equivalent per week  . Drug use: No  . 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 Physical activity:    Days per week: Not on file    Minutes per session: Not on file  . Stress: Not on file  Relationships  . Social connections:    Talks on phone: Not on file    Gets together: Not on file    Attends religious service: Not on file    Active member of club or organization: Not on file    Attends meetings of clubs or organizations: Not on file    Relationship status: Not on file  . Intimate partner violence:    Fear of current or ex partner: Not on file    Emotionally abused: Not on file    Physically abused: Not on file    Forced sexual activity: Not on file  Other Topics Concern  . Not on file  Social History Narrative  . Not on file    Outpatient Medications Prior to Visit  Medication Sig Dispense Refill  . lamoTRIgine (LAMICTAL) 100 MG tablet Take 1 tablet by mouth at bedtime.    . metoprolol tartrate (LOPRESSOR) 25 MG tablet TAKE 1/2  TABLET BY MOUTH TWICE A DAY 60 tablet 3  . QUEtiapine (SEROQUEL) 100 MG tablet Take 1 tablet (100 mg total) by mouth at bedtime. 90 tablet 0  . QUEtiapine (SEROQUEL) 100 MG tablet TAKE 1/2 TAB BY MOUTH AT BEDTIME FOR 1 DAY, THEN 1 TAB AT BEDTIME FOR 1 DAYS, THEN 2 TABS AT BEDTIME FOR 1 DAY, THEN 3 TABS DAILY AT BEDTIM 90 tablet 0  . FLUoxetine (PROZAC) 10 MG tablet TAKE 1 TABLET BY MOUTH EVERY DAY 30 tablet 1   No facility-administered medications prior to visit.     No Known Allergies  Review of Systems  Constitutional: Negative for chills, fever and malaise/fatigue.  HENT: Negative for congestion and hearing loss.   Eyes: Negative for discharge.  Respiratory: Negative for cough, sputum production and shortness of breath.   Cardiovascular: Negative for chest pain, palpitations and leg swelling.  Gastrointestinal: Negative for abdominal pain, blood in stool, constipation, diarrhea, heartburn, nausea and  vomiting.  Genitourinary: Negative for dysuria, frequency, hematuria and urgency.  Musculoskeletal: Negative for back pain, falls and myalgias.  Skin: Negative for rash.  Neurological: Negative for dizziness, sensory change, loss of consciousness, weakness and headaches.  Endo/Heme/Allergies: Negative for environmental allergies. Does not bruise/bleed easily.  Psychiatric/Behavioral: Negative for depression and suicidal ideas. The patient is not nervous/anxious and does not have insomnia.        Objective:    Physical Exam Constitutional:      General: She is not in acute distress.    Appearance: She is well-developed.  HENT:     Head: Normocephalic and atraumatic.  Eyes:     Conjunctiva/sclera: Conjunctivae normal.  Neck:     Musculoskeletal: Neck supple.     Thyroid: No thyromegaly.  Cardiovascular:     Rate and Rhythm: Normal rate and regular rhythm.     Heart sounds: Normal heart sounds. No murmur.  Pulmonary:     Effort: Pulmonary effort is normal. No respiratory distress.     Breath sounds: Normal breath sounds.  Abdominal:     General: Bowel sounds are normal. There is no distension.     Palpations: Abdomen is soft. There is no mass.     Tenderness: There is no abdominal tenderness.  Lymphadenopathy:     Cervical: No cervical adenopathy.  Skin:    General: Skin is warm and dry.  Neurological:     Mental Status: She is alert and oriented to person, place, and time.  Psychiatric:        Behavior: Behavior normal.     BP (!) 90/58 (BP Location: Left Arm, Patient Position: Sitting, Cuff Size: Normal)   Pulse 88   Temp 98.4 F (36.9 C) (Oral)   Resp 18   Ht 5\' 2"  (1.575 m)   Wt 145 lb 9.6 oz (66 kg)   SpO2 98%   BMI 26.63 kg/m  Wt Readings from Last 3 Encounters:  04/26/18 145 lb 9.6 oz (66 kg)  01/07/18 143 lb (64.9 kg)  10/29/17 139 lb 3.2 oz (63.1 kg)     Lab Results  Component Value Date   WBC 4.2 09/04/2017   HGB 14.4 09/04/2017   HCT 42.1  09/04/2017   PLT 196.0 09/04/2017   GLUCOSE 84 09/25/2017   CHOL 171 09/04/2017   TRIG 49.0 09/04/2017   HDL 65.70 09/04/2017   LDLCALC 96 09/04/2017   ALT 13 09/25/2017   AST 18 09/25/2017   NA 139 09/25/2017   K 3.9 09/25/2017  CL 101 09/25/2017   CREATININE 0.67 09/25/2017   BUN 11 09/25/2017   CO2 26 09/25/2017   TSH 1.56 09/04/2017    Lab Results  Component Value Date   TSH 1.56 09/04/2017   Lab Results  Component Value Date   WBC 4.2 09/04/2017   HGB 14.4 09/04/2017   HCT 42.1 09/04/2017   MCV 94.8 09/04/2017   PLT 196.0 09/04/2017   Lab Results  Component Value Date   NA 139 09/25/2017   K 3.9 09/25/2017   CO2 26 09/25/2017   GLUCOSE 84 09/25/2017   BUN 11 09/25/2017   CREATININE 0.67 09/25/2017   BILITOT 0.7 09/25/2017   ALKPHOS 52 09/04/2017   AST 18 09/25/2017   ALT 13 09/25/2017   PROT 7.4 09/25/2017   ALBUMIN 4.7 09/04/2017   CALCIUM 9.2 09/25/2017   GFR 93.59 09/04/2017   Lab Results  Component Value Date   CHOL 171 09/04/2017   Lab Results  Component Value Date   HDL 65.70 09/04/2017   Lab Results  Component Value Date   LDLCALC 96 09/04/2017   Lab Results  Component Value Date   TRIG 49.0 09/04/2017   Lab Results  Component Value Date   CHOLHDL 3 09/04/2017   No results found for: HGBA1C     Assessment & Plan:   Problem List Items Addressed This Visit    PALPITATIONS, OCCASIONAL - Primary    Well controlled with Metorpolol, check labs      Relevant Orders   CBC   Comprehensive metabolic panel   Anemia    Increase leafy greens, consider increased lean red meat and using cast iron cookware. Continue to monitor, report any concerns      Bipolar 2 disorder [facility])    Following with Dr Evelene Croon and has been placed on Lamictal and continued on Seroquel. Reports it is working well         I have discontinued Salvador F. Behrendt's FLUoxetine. I have also changed her QUEtiapine. Additionally, I am having her maintain her  metoprolol tartrate and lamoTRIgine.  No orders of the defined types were placed in this encounter.    Danise Edge, MD

## 2018-04-26 NOTE — Telephone Encounter (Signed)
Please have her return for repeat CMP and hydrate

## 2018-04-26 NOTE — Assessment & Plan Note (Signed)
Increase leafy greens, consider increased lean red meat and using cast iron cookware. Continue to monitor, report any concerns 

## 2018-04-26 NOTE — Telephone Encounter (Signed)
CRITICAL VALUE STICKER  CRITICAL VALUE: K+ 6.4  RECEIVER (on-site recipient of call): Mervin Kung, [facility])  DATE & TIME NOTIFIED: 04/26/18 @ 1:14pm  MESSENGER (representative from lab): Hope  MD NOTIFIED: Abner Greenspan / CMA, Montez Morita  TIME OF NOTIFICATION: 1:17pm  RESPONSE:

## 2018-04-27 LAB — COMPREHENSIVE METABOLIC PANEL
ALBUMIN: 4.7 g/dL (ref 3.5–5.2)
ALT: 7 U/L (ref 0–35)
AST: 15 U/L (ref 0–37)
Alkaline Phosphatase: 47 U/L (ref 39–117)
BILIRUBIN TOTAL: 0.6 mg/dL (ref 0.2–1.2)
BUN: 12 mg/dL (ref 6–23)
CALCIUM: 9.3 mg/dL (ref 8.4–10.5)
CHLORIDE: 102 meq/L (ref 96–112)
CO2: 24 mEq/L (ref 19–32)
CREATININE: 0.68 mg/dL (ref 0.40–1.20)
GFR: 95.27 mL/min (ref >60.00)
GLUCOSE: 85 mg/dL (ref 70–99)
Potassium: 3.4 mEq/L — ABNORMAL LOW (ref 3.5–5.1)
Sodium: 137 mEq/L (ref 135–145)
Total Protein: 7.9 g/dL (ref 6.0–8.3)

## 2018-10-13 ENCOUNTER — Other Ambulatory Visit: Payer: Self-pay | Admitting: Family Medicine

## 2018-10-26 ENCOUNTER — Ambulatory Visit: Payer: BC Managed Care – PPO | Admitting: Family Medicine

## 2018-10-26 ENCOUNTER — Encounter: Payer: Self-pay | Admitting: Family Medicine

## 2019-01-03 ENCOUNTER — Ambulatory Visit (INDEPENDENT_AMBULATORY_CARE_PROVIDER_SITE_OTHER): Payer: BC Managed Care – PPO | Admitting: Family Medicine

## 2019-01-03 ENCOUNTER — Other Ambulatory Visit: Payer: Self-pay

## 2019-01-03 ENCOUNTER — Encounter: Payer: Self-pay | Admitting: Family Medicine

## 2019-01-03 VITALS — BP 102/58 | HR 84 | Temp 98.6°F | Resp 18 | Ht 62.0 in | Wt 144.4 lb

## 2019-01-03 DIAGNOSIS — F3181 Bipolar II disorder: Secondary | ICD-10-CM

## 2019-01-03 DIAGNOSIS — Z0000 Encounter for general adult medical examination without abnormal findings: Secondary | ICD-10-CM | POA: Diagnosis not present

## 2019-01-03 LAB — CBC
HCT: 39.8 % (ref 36.0–46.0)
Hemoglobin: 13.6 g/dL (ref 12.0–15.0)
MCHC: 34.3 g/dL (ref 30.0–36.0)
MCV: 94.2 fl (ref 78.0–100.0)
Platelets: 215 10*3/uL (ref 150.0–400.0)
RBC: 4.22 Mil/uL (ref 3.87–5.11)
RDW: 11.9 % (ref 11.5–15.5)
WBC: 4.2 10*3/uL (ref 4.0–10.5)

## 2019-01-03 LAB — COMPREHENSIVE METABOLIC PANEL
ALT: 6 U/L (ref 0–35)
AST: 12 U/L (ref 0–37)
Albumin: 4.4 g/dL (ref 3.5–5.2)
Alkaline Phosphatase: 48 U/L (ref 39–117)
BUN: 11 mg/dL (ref 6–23)
CO2: 30 mEq/L (ref 19–32)
Calcium: 9.2 mg/dL (ref 8.4–10.5)
Chloride: 103 mEq/L (ref 96–112)
Creatinine, Ser: 0.71 mg/dL (ref 0.40–1.20)
GFR: 90.33 mL/min (ref >60.00)
Glucose, Bld: 77 mg/dL (ref 70–99)
Potassium: 4.7 mEq/L (ref 3.5–5.1)
Sodium: 139 mEq/L (ref 135–145)
Total Bilirubin: 0.9 mg/dL (ref 0.2–1.2)
Total Protein: 6.8 g/dL (ref 6.0–8.3)

## 2019-01-03 LAB — LIPID PANEL
Cholesterol: 178 mg/dL (ref 0–200)
HDL: 57.6 mg/dL (ref >39.00)
LDL Cholesterol: 110 mg/dL — ABNORMAL HIGH (ref 0–99)
NonHDL: 120.48
Total CHOL/HDL Ratio: 3
Triglycerides: 53 mg/dL (ref 0.0–149.0)
VLDL: 10.6 mg/dL (ref 0.0–40.0)

## 2019-01-03 LAB — TSH: TSH: 1.32 u[IU]/mL (ref 0.35–4.50)

## 2019-01-03 NOTE — Assessment & Plan Note (Signed)
Patient encouraged to maintain heart healthy diet, regular exercise, adequate sleep. Consider daily probiotics. Take medications as prescribed. Had flu shot in September, agrees to [facility] in next 6 months. Pap normal in 2019 repeat in 2022

## 2019-01-03 NOTE — Assessment & Plan Note (Signed)
Doing well on current meds, follow with psychiatiry

## 2019-01-03 NOTE — Progress Notes (Signed)
Subjective:    Patient ID: Valerie Ruiz, female    DOB: [birth year], 43 y.o.   MRN: [id number]  No chief complaint on file.   HPI Patient is in today for annual preventative exam and she is doing well. No recent febrile illness or recent hospitalizations. No acute concerns. She is maintaining quarantine well. Is trying to stay active and eat a heart healthy diet. Denies CP/palp/SOB/HA/congestion/fevers/GI or GU c/o. Taking meds as prescribed  Past Medical History:  Diagnosis Date  . Acute bronchitis 08/28/2015  . Allergic rhinitis   . Anemia 05/08/2016  . Asthma   . Chronic headaches   . Depression   . Glaucoma   . History of ovulatory pain 09/10/2014  . LLQ pain 09/10/2014  . Preventative health care 12/24/2014    Past Surgical History:  Procedure Laterality Date  . ANKLE SURGERY     Broken ankle    Family History  Problem Relation Age of Onset  . Hyperthyroidism Mother   . Breast cancer Paternal Grandmother   . Cancer Paternal Grandmother        metastatic breast with mets to bone  . Gout Father   . Heart disease Maternal Grandmother 53       MI  . Stroke Maternal Grandfather     Social History   Socioeconomic History  . Marital status: Married    Spouse name: Not on file  . Number of children: Not on file  . Years of education: Not on file  . Highest education level: Not on file  Occupational History  . Not on file  Social Needs  . Financial resource strain: Not on file  . Food insecurity    Worry: Not on file    Inability: Not on file  . Transportation needs    Medical: Not on file    Non-medical: Not on file  Tobacco Use  . Smoking status: Never Smoker  . Smokeless tobacco: Never Used  Substance and Sexual Activity  . Alcohol use: No    Alcohol/week: 3.0 standard drinks    Types: 3 Standard drinks or equivalent per week  . Drug use: No  . Sexual activity: Yes    Partners: Female    Comment: lives with spouse, works as Designer, industrial/product support at  [employer]   Lifestyle  . Physical activity    Days per week: Not on file    Minutes per session: Not on file  . Stress: Not on file  Relationships  . Social Musician on phone: Not on file    Gets together: Not on file    Attends religious service: Not on file    Active member of club or organization: Not on file    Attends meetings of clubs or organizations: Not on file    Relationship status: Not on file  . Intimate partner violence    Fear of current or ex partner: Not on file    Emotionally abused: Not on file    Physically abused: Not on file    Forced sexual activity: Not on file  Other Topics Concern  . Not on file  Social History Narrative  . Not on file    Outpatient Medications Prior to Visit  Medication Sig Dispense Refill  . lamoTRIgine (LAMICTAL) 100 MG tablet Take 1 tablet by mouth at bedtime.    . metoprolol tartrate (LOPRESSOR) 25 MG tablet TAKE 1/2 TABLET BY MOUTH TWICE A DAY 90 tablet 3  .  QUEtiapine (SEROQUEL) 100 MG tablet Take 1 tablet (100 mg total) by mouth at bedtime. 90 tablet 0   No facility-administered medications prior to visit.     No Known Allergies  Review of Systems  Constitutional: Negative for chills, fever and malaise/fatigue.  HENT: Negative for congestion and hearing loss.   Eyes: Negative for discharge.  Respiratory: Negative for cough, sputum production and shortness of breath.   Cardiovascular: Negative for chest pain, palpitations and leg swelling.  Gastrointestinal: Negative for abdominal pain, blood in stool, constipation, diarrhea, heartburn, nausea and vomiting.  Genitourinary: Negative for dysuria, frequency, hematuria and urgency.  Musculoskeletal: Negative for back pain, falls and myalgias.  Skin: Negative for rash.  Neurological: Negative for dizziness, sensory change, loss of consciousness, weakness and headaches.  Endo/Heme/Allergies: Negative for environmental allergies. Does not  bruise/bleed easily.  Psychiatric/Behavioral: Negative for depression and suicidal ideas. The patient is not nervous/anxious and does not have insomnia.        Objective:    Physical Exam Constitutional:      General: She is not in acute distress.    Appearance: She is well-developed.  HENT:     Head: Normocephalic and atraumatic.  Eyes:     Conjunctiva/sclera: Conjunctivae normal.  Neck:     Musculoskeletal: Neck supple.     Thyroid: No thyromegaly.  Cardiovascular:     Rate and Rhythm: Normal rate and regular rhythm.     Heart sounds: Normal heart sounds. No murmur.  Pulmonary:     Effort: Pulmonary effort is normal. No respiratory distress.     Breath sounds: Normal breath sounds.  Abdominal:     General: Bowel sounds are normal. There is no distension.     Palpations: Abdomen is soft. There is no mass.     Tenderness: There is no abdominal tenderness.  Lymphadenopathy:     Cervical: No cervical adenopathy.  Skin:    General: Skin is warm and dry.  Neurological:     Mental Status: She is alert and oriented to person, place, and time.  Psychiatric:        Behavior: Behavior normal.     BP (!) 102/58 (BP Location: Left Arm, Patient Position: Sitting, Cuff Size: Normal)   Pulse 84   Temp 98.6 F (37 C) (Temporal)   Resp 18   Ht 5\' 2"  (1.575 m)   Wt 144 lb 6.4 oz (65.5 kg)   SpO2 99%   BMI 26.41 kg/m  Wt Readings from Last 3 Encounters:  01/03/19 144 lb 6.4 oz (65.5 kg)  04/26/18 145 lb 9.6 oz (66 kg)  01/07/18 143 lb (64.9 kg)    Diabetic Foot Exam - Simple   No data filed     Lab Results  Component Value Date   WBC 4.4 04/26/2018   HGB 14.2 04/26/2018   HCT 41.4 04/26/2018   PLT 190.0 04/26/2018   GLUCOSE 85 04/26/2018   CHOL 171 09/04/2017   TRIG 49.0 09/04/2017   HDL 65.70 09/04/2017   LDLCALC 96 09/04/2017   ALT 7 04/26/2018   AST 15 04/26/2018   NA 137 04/26/2018   K 3.4 (L) 04/26/2018   CL 102 04/26/2018   CREATININE 0.68 04/26/2018    BUN 12 04/26/2018   CO2 24 04/26/2018   TSH 1.56 09/04/2017    Lab Results  Component Value Date   TSH 1.56 09/04/2017   Lab Results  Component Value Date   WBC 4.4 04/26/2018   HGB 14.2 04/26/2018  HCT 41.4 04/26/2018   MCV 94.6 04/26/2018   PLT 190.0 04/26/2018   Lab Results  Component Value Date   NA 137 04/26/2018   K 3.4 (L) 04/26/2018   CO2 24 04/26/2018   GLUCOSE 85 04/26/2018   BUN 12 04/26/2018   CREATININE 0.68 04/26/2018   BILITOT 0.6 04/26/2018   ALKPHOS 47 04/26/2018   AST 15 04/26/2018   ALT 7 04/26/2018   PROT 7.9 04/26/2018   ALBUMIN 4.7 04/26/2018   CALCIUM 9.3 04/26/2018   GFR 95.27 04/26/2018   Lab Results  Component Value Date   CHOL 171 09/04/2017   Lab Results  Component Value Date   HDL 65.70 09/04/2017   Lab Results  Component Value Date   LDLCALC 96 09/04/2017   Lab Results  Component Value Date   TRIG 49.0 09/04/2017   Lab Results  Component Value Date   CHOLHDL 3 09/04/2017   No results found for: HGBA1C     Assessment & Plan:   Problem List Items Addressed This Visit    Preventative health care - Primary    Patient encouraged to maintain heart healthy diet, regular exercise, adequate sleep. Consider daily probiotics. Take medications as prescribed. Had flu shot in September, agrees to [facility] in next 6 months. Pap normal in 2019 repeat in 2022      Relevant Orders   CBC   Comprehensive metabolic panel   Lipid panel   TSH   Bipolar 2 disorder [facility])    Doing well on current meds, follow with psychiatiry         I am having Lorita Officer. Schuknecht maintain her lamoTRIgine, QUEtiapine, and metoprolol tartrate.  No orders of the defined types were placed in this encounter.    Penni Homans, MD

## 2019-01-03 NOTE — Patient Instructions (Signed)
Omron blood pressure cuff, upper arm   Pulse oximeter, oxygen in the 90s   Weekly vitals    Multivitamin with minerals, selenium, zinc, vitamin C and D  Preventive Care 56-42 Years Old, Female Preventive care refers to visits with your health care provider and lifestyle choices that can promote health and wellness. This includes:  A yearly physical exam. This may also be called an annual well check.  Regular dental visits and eye exams.  Immunizations.  Screening for certain conditions.  Healthy lifestyle choices, such as eating a healthy diet, getting regular exercise, not using drugs or products that contain nicotine and tobacco, and limiting alcohol use. What can I expect for my preventive care visit? Physical exam Your health care provider will check your:  Height and weight. This may be used to calculate body mass index (BMI), which tells if you are at a healthy weight.  Heart rate and blood pressure.  Skin for abnormal spots. Counseling Your health care provider may ask you questions about your:  Alcohol, tobacco, and drug use.  Emotional well-being.  Home and relationship well-being.  Sexual activity.  Eating habits.  Work and work Statistician.  Method of birth control.  Menstrual cycle.  Pregnancy history. What immunizations do I need?  Influenza (flu) vaccine  This is recommended every year. Tetanus, diphtheria, and pertussis (Tdap) vaccine  You may need a Td booster every 10 years. Varicella (chickenpox) vaccine  You may need this if you have not been vaccinated. Human papillomavirus (HPV) vaccine  If recommended by your health care provider, you may need three doses over 6 months. Measles, mumps, and rubella (MMR) vaccine  You may need at least one dose of MMR. You may also need a second dose. Meningococcal conjugate (MenACWY) vaccine  One dose is recommended if you are age 42-21 years and a first-year college student living in a  residence [place], or if you have one of several medical conditions. You may also need additional booster doses. Pneumococcal conjugate (PCV13) vaccine  You may need this if you have certain conditions and were not previously vaccinated. Pneumococcal polysaccharide (PPSV23) vaccine  You may need one or two doses if you smoke cigarettes or if you have certain conditions. Hepatitis A vaccine  You may need this if you have certain conditions or if you travel or work in places where you may be exposed to hepatitis A. Hepatitis B vaccine  You may need this if you have certain conditions or if you travel or work in places where you may be exposed to hepatitis B. Haemophilus influenzae type b (Hib) vaccine  You may need this if you have certain conditions. You may receive vaccines as individual doses or as more than one vaccine together in one shot (combination vaccines). Talk with your health care provider about the risks and benefits of combination vaccines. What tests do I need?  Blood tests  Lipid and cholesterol levels. These may be checked every 5 years starting at age 42.  Hepatitis C test.  Hepatitis B test. Screening  Diabetes screening. This is done by checking your blood sugar (glucose) after you have not eaten for a while (fasting).  Sexually transmitted disease (STD) testing.  BRCA-related cancer screening. This may be done if you have a family history of breast, ovarian, tubal, or peritoneal cancers.  Pelvic exam and Pap test. This may be done every 3 years starting at age 42. Starting at age 42, this may be done every 5 years if  you have a Pap test in combination with an HPV test. Talk with your health care provider about your test results, treatment options, and if necessary, the need for more tests. Follow these instructions at home: Eating and drinking   Eat a diet that includes fresh fruits and vegetables, whole grains, lean protein, and low-fat dairy.  Take vitamin  and mineral supplements as recommended by your health care provider.  Do not drink alcohol if: ? Your health care provider tells you not to drink. ? You are pregnant, may be pregnant, or are planning to become pregnant.  If you drink alcohol: ? Limit how much you have to 0-1 drink a day. ? Be aware of how much alcohol is in your drink. In the [place] one drink equals one 12 oz bottle of beer (355 mL), one 5 oz glass of wine (148 mL), or one 1 oz glass of hard liquor (44 mL). Lifestyle  Take daily care of your teeth and gums.  Stay active. Exercise for at least 30 minutes on 5 or more days each week.  Do not use any products that contain nicotine or tobacco, such as cigarettes, e-cigarettes, and chewing tobacco. If you need help quitting, ask your health care provider.  If you are sexually active, practice safe sex. Use a condom or other form of birth control (contraception) in order to prevent pregnancy and STIs (sexually transmitted infections). If you plan to become pregnant, see your health care provider for a preconception visit. What's next?  Visit your health care provider once a year for a well check visit.  Ask your health care provider how often you should have your eyes and teeth checked.  Stay up to date on all vaccines. This information is not intended to replace advice given to you by your health care provider. Make sure you discuss any questions you have with your health care provider. Document Released: 04/15/2001 Document Revised: 10/29/2017 Document Reviewed: 10/29/2017 Elsevier Patient Education  2020 [employer].

## 2019-07-11 LAB — HM MAMMOGRAPHY

## 2019-07-19 ENCOUNTER — Encounter: Payer: Self-pay | Admitting: Family

## 2019-07-19 ENCOUNTER — Ambulatory Visit (INDEPENDENT_AMBULATORY_CARE_PROVIDER_SITE_OTHER): Payer: BC Managed Care – PPO

## 2019-07-19 ENCOUNTER — Telehealth (INDEPENDENT_AMBULATORY_CARE_PROVIDER_SITE_OTHER): Payer: BC Managed Care – PPO | Admitting: Family

## 2019-07-19 ENCOUNTER — Other Ambulatory Visit: Payer: Self-pay

## 2019-07-19 VITALS — BP 117/76 | HR 86 | Temp 98.4°F | Resp 16 | Ht 62.0 in | Wt 144.0 lb

## 2019-07-19 DIAGNOSIS — R5383 Other fatigue: Secondary | ICD-10-CM

## 2019-07-19 DIAGNOSIS — R0602 Shortness of breath: Secondary | ICD-10-CM

## 2019-07-19 LAB — D-DIMER, QUANTITATIVE: D-Dimer, Quant: 0.23 mcg/mL FEU (ref <0.50)

## 2019-07-19 NOTE — Addendum Note (Signed)
Addended by: Mervin Kung A on: 07/19/2019 02:10 PM   Modules accepted: Orders

## 2019-07-19 NOTE — Progress Notes (Addendum)
Virtual Visit via Video Note  I connected with Valerie Ruiz on 07/19/19 at  1:00 PM EDT by a video enabled telemedicine application and verified that I am speaking with the correct person using two identifiers.  Location: Patient: home Provider: office   I discussed the limitations of evaluation and management by telemedicine and the availability of in person appointments. The patient expressed understanding and agreed to proceed.  History of Present Illness:  Reports + fatigue, takes a lot of effort to do household chores. Gets easily out of breath even with mopping. Notes that it started about 2 weeks ago.  She reports that she has had similar fatigue in the past. She reports + hx of heavy periods. LMP was 2 weeks ago.  Reports cycle was heavy- lasts 4-5 days.    She reports some SOB even at rest.   Last night she had some palpitations. denies chest pain.  Was not feeling nervous at the time of her palpitations.  Denies calf pain/swelling. Denies recent travel. Notes thinning hair for the last 4 months.     Observations/Objective:   Gen: Awake, alert, no acute distress Resp: Breathing is even and non-labored Psych: calm/pleasant demeanor Neuro: Alert and Oriented x 3, + facial symmetry, speech is clear.   Assessment and Plan:  Fatigue- heavy periods raise concern for anemia and hypothyroid. Will obtain TSH, CBC.  SOB- she will come to the office to complete an EKG and stat D dimer. We discussed that if her D dimer is +, then she will need to go to the ER this evening for further evaluation and CTA to rule out PE. She verbalizes understanding.    Addendum:  Pt returned for EKG nurse visit, vitals, labs.    D dimer negative.  EKG tracing is personally reviewed.  EKG notes NSR.  No acute changes.   Vitals:   07/19/19 1403  BP: 117/76  Pulse: 86  Resp: 16  Temp: 98.4 F (36.9 C)  SpO2: 100%     Follow Up Instructions:    I discussed the assessment and treatment  plan with the patient. The patient was provided an opportunity to ask questions and all were answered. The patient agreed with the plan and demonstrated an understanding of the instructions.   The patient was advised to call back or seek an in-person evaluation if the symptoms worsen or if the condition fails to improve as anticipated.  Lemont Fillers, NP

## 2019-07-20 ENCOUNTER — Encounter: Payer: Self-pay | Admitting: *Deleted

## 2019-07-20 LAB — CBC WITH DIFFERENTIAL/PLATELET
Basophils Absolute: 0.1 10*3/uL (ref 0.0–0.1)
Basophils Relative: 1.1 % (ref 0.0–3.0)
Eosinophils Absolute: 0 10*3/uL (ref 0.0–0.7)
Eosinophils Relative: 0.6 % (ref 0.0–5.0)
HCT: 39.2 % (ref 36.0–46.0)
Hemoglobin: 13.7 g/dL (ref 12.0–15.0)
Lymphocytes Relative: 20.3 % (ref 12.0–46.0)
Lymphs Abs: 1.1 10*3/uL (ref 0.7–4.0)
MCHC: 35 g/dL (ref 30.0–36.0)
MCV: 94.8 fl (ref 78.0–100.0)
Monocytes Absolute: 0.4 10*3/uL (ref 0.1–1.0)
Monocytes Relative: 7.1 % (ref 3.0–12.0)
Neutro Abs: 3.8 10*3/uL (ref 1.4–7.7)
Neutrophils Relative %: 70.9 % (ref 43.0–77.0)
Platelets: 219 10*3/uL (ref 150.0–400.0)
RBC: 4.13 Mil/uL (ref 3.87–5.11)
RDW: 12.3 % (ref 11.5–15.5)
WBC: 5.3 10*3/uL (ref 4.0–10.5)

## 2019-07-20 LAB — COMPREHENSIVE METABOLIC PANEL
ALT: 9 U/L (ref 0–35)
AST: 15 U/L (ref 0–37)
Albumin: 4.6 g/dL (ref 3.5–5.2)
Alkaline Phosphatase: 62 U/L (ref 39–117)
BUN: 8 mg/dL (ref 6–23)
CO2: 28 mEq/L (ref 19–32)
Calcium: 9.3 mg/dL (ref 8.4–10.5)
Chloride: 103 mEq/L (ref 96–112)
Creatinine, Ser: 0.74 mg/dL (ref 0.40–1.20)
GFR: 85.9 mL/min (ref >60.00)
Glucose, Bld: 89 mg/dL (ref 70–99)
Potassium: 4.1 mEq/L (ref 3.5–5.1)
Sodium: 139 mEq/L (ref 135–145)
Total Bilirubin: 0.6 mg/dL (ref 0.2–1.2)
Total Protein: 7 g/dL (ref 6.0–8.3)

## 2019-07-20 LAB — TSH: TSH: 1.59 u[IU]/mL (ref 0.35–4.50)

## 2019-07-20 NOTE — Progress Notes (Signed)
EKG tracing is personally reviewed.  EKG notes NSR.  No acute changes.   Vitals:   07/19/19 1403  BP: 117/76  Pulse: 86  Resp: 16  Temp: 98.4 F (36.9 C)  SpO2: 100%

## 2019-09-28 MED FILL — lamoTRIgine 200 MG TABS: 200 | 90 days supply | Qty: 180 | Fill #0

## 2019-10-07 ENCOUNTER — Encounter: Payer: Self-pay | Admitting: Family Medicine

## 2019-10-10 MED ORDER — METOPROLOL TARTRATE 25 MG PO TABS: 12.5000 mg | ORAL_TABLET | Freq: Two times a day (BID) | ORAL | 3 refills | Status: DC

## 2019-10-10 NOTE — Addendum Note (Signed)
Addended by: Maximino Sarin on: 10/10/2019 04:23 PM   Modules accepted: Orders

## 2019-12-13 ENCOUNTER — Other Ambulatory Visit: Payer: Self-pay

## 2019-12-13 ENCOUNTER — Emergency Department
Admission: RE | Admit: 2019-12-13 | Discharge: 2019-12-13 | Disposition: A | Discharge status: Home / Self Care | Payer: BC Managed Care – PPO | Source: Ambulatory Visit | Attending: Family Medicine | Admitting: Family Medicine

## 2019-12-13 VITALS — BP 126/82 | HR 82 | Temp 98.7°F | Resp 15 | Ht 62.0 in | Wt 140.0 lb

## 2019-12-13 DIAGNOSIS — R300 Dysuria: Secondary | ICD-10-CM | POA: Diagnosis not present

## 2019-12-13 DIAGNOSIS — N3090 Cystitis, unspecified without hematuria: Secondary | ICD-10-CM

## 2019-12-13 LAB — POCT URINALYSIS DIP (MANUAL ENTRY)
Bilirubin, UA: NEGATIVE
Blood, UA: NEGATIVE
Glucose, UA: NEGATIVE mg/dL
Nitrite, UA: NEGATIVE
Protein Ur, POC: NEGATIVE mg/dL
Spec Grav, UA: 1.025 (ref 1.010–1.025)
Urobilinogen, UA: 0.2 E.U./dL
pH, UA: 6 (ref 5.0–8.0)

## 2019-12-13 MED ORDER — NITROFURANTOIN MONOHYD MACRO 100 MG PO CAPS
ORAL_CAPSULE | ORAL | 0 refills | Status: DC
Start: 2019-12-13 — End: 2020-06-14

## 2019-12-13 NOTE — ED Provider Notes (Signed)
[facility]    CSN: [id number] Arrival date & time: 12/13/19  1633      History   Chief Complaint Chief Complaint  Patient presents with  . Appointment  . Dysuria    HPI Valerie Ruiz is a 44 y.o. female.   Last night patient developed malodorous urine with dysuria, frequency, and nocturia.  She denies fevers, chills, and sweats.  The history is provided by the patient.  Dysuria Pain quality:  Burning Pain severity:  Mild Onset quality:  Sudden Duration:  1 day Timing:  Constant Progression:  Worsening Chronicity:  New Recent urinary tract infections: no   Relieved by:  None tried Worsened by:  Nothing Ineffective treatments:  None tried Urinary symptoms: foul-smelling urine, frequent urination and hesitancy   Urinary symptoms: no discolored urine and no hematuria   Associated symptoms: no abdominal pain, no fever, no flank pain, no genital lesions, no nausea, no vaginal discharge and no vomiting   Risk factors: not pregnant     Past Medical History:  Diagnosis Date  . Acute bronchitis 08/28/2015  . Allergic rhinitis   . Anemia 05/08/2016  . Asthma   . Chronic headaches   . Depression   . Glaucoma   . History of ovulatory pain 09/10/2014  . LLQ pain 09/10/2014  . Preventative health care 12/24/2014    Patient Active Problem List   Diagnosis Date Noted  . Attention deficit disorder (ADD) 09/06/2017  . Bipolar 2 disorder (HCC) 09/06/2017  . Anemia 05/08/2016  . Leukopenia 11/23/2015  . 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 Preventative health care 12/24/2014  . History of ovulatory pain 09/10/2014  . Sacral dysfunction 01/30/2014  . Nonallopathic lesion of cervical region 12/02/2013  . Nonallopathic lesion of thoracic region 12/02/2013  . Nonallopathic lesion of lumbar region 12/02/2013  . Cervicogenic headache 11/28/2013  . Dizziness 10/06/2008  . PALPITATIONS, OCCASIONAL 10/06/2008  . SEBACEOUS CYST, BREAST 04/25/2008     Past Surgical History:  Procedure Laterality Date  . ANKLE SURGERY     Broken ankle    OB History    Gravida  0   Para  0   Term  0   Preterm  0   AB  0   Living  0     SAB  0   TAB  0   Ectopic  0   Multiple  0   Live Births  0            Home Medications    Prior to Admission medications   Medication Sig Start Date End Date Taking? Authorizing Provider  metoprolol tartrate (LOPRESSOR) 25 MG tablet Take 0.5 tablets (12.5 mg total) by mouth 2 (two) times daily. 10/10/19  Yes [name], MD  lamoTRIgine (LAMICTAL) 100 MG tablet Take 1 tablet by mouth at bedtime. 02/26/18   [provider]  nitrofurantoin, macrocrystal-monohydrate, (MACROBID) 100 MG capsule Take one cap PO Q12hr with food. 12/13/19   Lattie Haw, MD    Family History Family History  Problem Relation Age of Onset  . Hyperthyroidism Mother   . Breast cancer Paternal Grandmother   . Cancer Paternal Grandmother        metastatic breast with mets to bone  . Gout Father   . Heart disease Maternal Grandmother 81       MI  . Stroke Maternal Grandfather     Social History Social History   Tobacco Use  . Smoking  status: Never Smoker  . Smokeless tobacco: Never Used  Substance Use Topics  . Alcohol use: No    Alcohol/week: 3.0 standard drinks    Types: 3 Standard drinks or equivalent per week  . Drug use: No     Allergies   Patient has no known allergies.   Review of Systems Review of Systems  Constitutional: Negative for fever.  Gastrointestinal: Negative for abdominal pain, nausea and vomiting.  Genitourinary: Positive for dysuria. Negative for flank pain and vaginal discharge.  All other systems reviewed and are negative.    Physical Exam Triage Vital Signs ED Triage Vitals  Enc Vitals Group     BP 12/13/19 1652 126/82     Pulse Rate 12/13/19 1652 82     Resp 12/13/19 1652 15     Temp 12/13/19 1652 98.7 F (37.1 C)     Temp Source 12/13/19 1652  Oral     SpO2 12/13/19 1652 100 %     Weight 12/13/19 1654 140 lb (63.5 kg)     Height 12/13/19 1654 5\' 2"  (1.575 m)     Head Circumference --      Peak Flow --      Pain Score 12/13/19 1654 3     Pain Loc --      Pain Edu? --      Excl. in GC? --    No data found.  Updated Vital Signs BP 126/82 (BP Location: Right Arm)   Pulse 82   Temp 98.7 F (37.1 C) (Oral)   Resp 15   Ht 5\' 2"  (1.575 m)   Wt 63.5 kg   LMP 11/29/2019 (Approximate)   SpO2 100%   BMI 25.61 kg/m   Visual Acuity Right Eye Distance:   Left Eye Distance:   Bilateral Distance:    Right Eye Near:   Left Eye Near:    Bilateral Near:     Physical Exam Nursing notes and Vital Signs reviewed. Appearance:  Patient appears stated age, and in no acute distress.    Eyes:  Pupils are equal, round, and reactive to light and accomodation.  Extraocular movement is intact.  Conjunctivae are not inflamed   Pharynx:  Normal; moist mucous membranes  Neck:  Supple.  No adenopathy Lungs:  Clear to auscultation.  Breath sounds are equal.  Moving air well. Heart:  Regular rate and rhythm without murmurs, rubs, or gallops.  Abdomen:  Nontender without masses or hepatosplenomegaly.  Bowel sounds are present.  No CVA or flank tenderness.  Extremities:  No edema.  Skin:  No rash present.     UC Treatments / Results  Labs (all labs ordered are listed, but only abnormal results are displayed) Labs Reviewed  POCT URINALYSIS DIP (MANUAL ENTRY) - Abnormal; Notable for the following components:      Result Value   Color, UA other (*)    Clarity, UA cloudy (*)    Ketones, POC UA trace (5) (*)    Leukocytes, UA Small (1+) (*)    All other components within normal limits  URINE CULTURE    EKG   Radiology No results found.  Procedures Procedures (including critical care time)  Medications Ordered in UC Medications - No data to display  Initial Impression / Assessment and Plan / UC Course  I have reviewed the  triage vital signs and the nursing notes.  Pertinent labs & imaging results that were available during my care of the patient were reviewed by me and  considered in my medical decision making (see chart for details).    Urine culture pending.  Begin Macrobid. Followup with Family Doctor if not improved in one week.    Final Clinical Impressions(s) / UC Diagnoses   Final diagnoses:  Dysuria  Cystitis     Discharge Instructions     Increase fluid intake. May use non-prescription AZO for about two days, if desired, to decrease urinary discomfort.   If symptoms become significantly worse during the night or over the weekend, proceed to the local emergency room.     ED Prescriptions    Medication Sig Dispense Auth. Provider   nitrofurantoin, macrocrystal-monohydrate, (MACROBID) 100 MG capsule Take one cap PO Q12hr with food. 14 capsule Lattie Haw, MD        Lattie Haw, MD 12/16/19 1000

## 2019-12-13 NOTE — Discharge Instructions (Addendum)
Increase fluid intake. May use non-prescription AZO for about two days, if desired, to decrease urinary discomfort.  If symptoms become significantly worse during the night or over the weekend, proceed to the local emergency room.  

## 2019-12-13 NOTE — ED Triage Notes (Signed)
Burning & urgency w/ urination since yesterday Bladder pressure  Denies flank pain, fever or chills Moderna vaccine

## 2019-12-15 LAB — URINE CULTURE
MICRO NUMBER:: 11064220
SPECIMEN QUALITY:: ADEQUATE

## 2019-12-26 ENCOUNTER — Ambulatory Visit: Payer: BC Managed Care – PPO | Admitting: Family Medicine

## 2019-12-26 ENCOUNTER — Encounter: Payer: Self-pay | Admitting: Family Medicine

## 2019-12-26 ENCOUNTER — Other Ambulatory Visit: Payer: Self-pay | Admitting: Family Medicine

## 2019-12-26 ENCOUNTER — Other Ambulatory Visit: Payer: Self-pay

## 2019-12-26 ENCOUNTER — Telehealth: Payer: Self-pay | Admitting: Family Medicine

## 2019-12-26 VITALS — BP 112/74 | HR 92 | Temp 97.4°F | Resp 16 | Wt 139.2 lb

## 2019-12-26 DIAGNOSIS — Z23 Encounter for immunization: Secondary | ICD-10-CM

## 2019-12-26 DIAGNOSIS — N951 Menopausal and female climacteric states: Secondary | ICD-10-CM | POA: Diagnosis not present

## 2019-12-26 DIAGNOSIS — G4700 Insomnia, unspecified: Secondary | ICD-10-CM | POA: Diagnosis not present

## 2019-12-26 DIAGNOSIS — R002 Palpitations: Secondary | ICD-10-CM | POA: Diagnosis not present

## 2019-12-26 DIAGNOSIS — B373 Candidiasis of vulva and vagina: Secondary | ICD-10-CM

## 2019-12-26 DIAGNOSIS — L659 Nonscarring hair loss, unspecified: Secondary | ICD-10-CM

## 2019-12-26 DIAGNOSIS — N390 Urinary tract infection, site not specified: Secondary | ICD-10-CM

## 2019-12-26 DIAGNOSIS — F3181 Bipolar II disorder: Secondary | ICD-10-CM

## 2019-12-26 DIAGNOSIS — B3731 Acute candidiasis of vulva and vagina: Secondary | ICD-10-CM

## 2019-12-26 MED ORDER — FLUCONAZOLE 150 MG PO TABS: 150.0000 mg | ORAL_TABLET | ORAL | 1 refills | Status: DC

## 2019-12-26 NOTE — Telephone Encounter (Signed)
I sent it over. TY

## 2019-12-26 NOTE — Progress Notes (Signed)
Subjective:    Patient ID: Valerie Ruiz, female    DOB: 05-15-1975, 44 y.o.   MRN: [id number]  Chief Complaint  Patient presents with  . Consult    hormone    HPI Patient is in today for follow up on chronic medical concerns. No recent febrile illness or recent hospitalizations. She notes increased irritability, more disrupted sleep, palpitations, hair loss, fatigue. Symptoms have been increasing over the past 6-9 months. She questions if she is struggling with perimenopauase. Denies CP/palp/SOB/HA/congestion/fevers/GI or GU c/o. Taking meds as prescribed  Past Medical History:  Diagnosis Date  . Acute bronchitis 08/28/2015  . Allergic rhinitis   . Anemia 05/08/2016  . Asthma   . Chronic headaches   . Depression   . Glaucoma   . History of ovulatory pain 09/10/2014  . LLQ pain 09/10/2014  . Preventative health care 12/24/2014    Past Surgical History:  Procedure Laterality Date  . ANKLE SURGERY     Broken ankle    Family History  Problem Relation Age of Onset  . Hyperthyroidism Mother   . Breast cancer Paternal Grandmother   . Cancer Paternal Grandmother        metastatic breast with mets to bone  . Gout Father   . Heart disease Maternal Grandmother 24       MI  . Stroke Maternal Grandfather     Social History   Socioeconomic History  . Marital status: Married    Spouse name: Not on file  . Number of children: Not on file  . Years of education: Not on file  . Highest education level: Not on file  Occupational History  . Not on file  Tobacco Use  . Smoking status: Never Smoker  . Smokeless tobacco: Never Used  Substance and Sexual Activity  . Alcohol use: No    Alcohol/week: 3.0 standard drinks    Types: 3 Standard drinks or equivalent per week  . Drug use: No  . Sexual activity: Yes    Partners: Female    Comment: lives with spouse, works as Designer, industrial/product support at [employer]   Other Topics Concern  . Not on file  Social  History Narrative  . Not on file   Social Determinants of Health   Financial Resource Strain:   . Difficulty of Paying Living Expenses: Not on file  Food Insecurity:   . Worried About Programme researcher, broadcasting/film/video in the Last Year: Not on file  . Ran Out of Food in the Last Year: Not on file  Transportation Needs:   . Lack of Transportation (Medical): Not on file  . Lack of Transportation (Non-Medical): Not on file  Physical Activity:   . Days of Exercise per Week: Not on file  . Minutes of Exercise per Session: Not on file  Stress:   . Feeling of Stress : Not on file  Social Connections:   . Frequency of Communication with Friends and Family: Not on file  . Frequency of Social Gatherings with Friends and Family: Not on file  . Attends Religious Services: Not on file  . Active Member of Clubs or Organizations: Not on file  . Attends Banker Meetings: Not on file  . Marital Status: Not on file  Intimate Partner Violence:   . Fear of Current or Ex-Partner: Not on file  . Emotionally Abused: Not on file  . Physically Abused: Not on file  . Sexually Abused: Not on file  Outpatient Medications Prior to Visit  Medication Sig Dispense Refill  . lamoTRIgine (LAMICTAL) 100 MG tablet Take 1 tablet by mouth at bedtime.    . metoprolol tartrate (LOPRESSOR) 25 MG tablet Take 0.5 tablets (12.5 mg total) by mouth 2 (two) times daily. 90 tablet 3  . nitrofurantoin, macrocrystal-monohydrate, (MACROBID) 100 MG capsule Take one cap PO Q12hr with food. 14 capsule 0   No facility-administered medications prior to visit.    No Known Allergies  Review of Systems  Constitutional: Positive for malaise/fatigue. Negative for fever.  HENT: Negative for congestion.   Eyes: Negative for blurred vision.  Respiratory: Negative for shortness of breath.   Cardiovascular: Negative for chest pain, palpitations and leg swelling.  Gastrointestinal: Negative for abdominal pain, blood in stool and  nausea.  Genitourinary: Negative for dysuria and frequency.  Musculoskeletal: Negative for falls.  Skin: Negative for rash.  Neurological: Negative for dizziness, loss of consciousness and headaches.  Endo/Heme/Allergies: Negative for environmental allergies.  Psychiatric/Behavioral: Positive for depression. Negative for hallucinations. The patient is nervous/anxious and has insomnia.        Objective:    Physical Exam Vitals and nursing note reviewed.  Constitutional:      General: She is not in acute distress.    Appearance: She is well-developed.  HENT:     Head: Normocephalic and atraumatic.     Nose: Nose normal.  Eyes:     General:        Right eye: No discharge.        Left eye: No discharge.  Cardiovascular:     Rate and Rhythm: Normal rate and regular rhythm.     Heart sounds: No murmur heard.   Pulmonary:     Effort: Pulmonary effort is normal.     Breath sounds: Normal breath sounds.  Abdominal:     General: Bowel sounds are normal.     Palpations: Abdomen is soft.     Tenderness: There is no abdominal tenderness.  Musculoskeletal:     Cervical back: Normal range of motion and neck supple.  Skin:    General: Skin is warm and dry.  Neurological:     Mental Status: She is alert and oriented to person, place, and time.     BP 112/74 (BP Location: Right Arm)   Pulse 92   Temp (!) 97.4 F (36.3 C) (Oral)   Resp 16   Wt 139 lb 3.2 oz (63.1 kg)   LMP 11/29/2019 (Approximate)   SpO2 99%   BMI 25.46 kg/m  Wt Readings from Last 3 Encounters:  12/26/19 139 lb 3.2 oz (63.1 kg)  12/13/19 140 lb (63.5 kg)  07/19/19 144 lb (65.3 kg)    Diabetic Foot Exam - Simple   No data filed     Lab Results  Component Value Date   WBC 4.2 12/26/2019   HGB 14.3 12/26/2019   HCT 40.5 12/26/2019   PLT 237 12/26/2019   GLUCOSE 89 12/26/2019   CHOL 178 01/03/2019   TRIG 53.0 01/03/2019   HDL 57.60 01/03/2019   LDLCALC 110 (H) 01/03/2019   ALT 8 12/26/2019   AST  15 12/26/2019   NA 139 12/26/2019   K 4.1 12/26/2019   CL 104 12/26/2019   CREATININE 0.77 12/26/2019   BUN 8 12/26/2019   CO2 28 12/26/2019   TSH 1.59 07/19/2019    Lab Results  Component Value Date   TSH 1.59 07/19/2019   Lab Results  Component Value Date  WBC 4.2 12/26/2019   HGB 14.3 12/26/2019   HCT 40.5 12/26/2019   MCV 94.4 12/26/2019   PLT 237 12/26/2019   Lab Results  Component Value Date   NA 139 12/26/2019   K 4.1 12/26/2019   CO2 28 12/26/2019   GLUCOSE 89 12/26/2019   BUN 8 12/26/2019   CREATININE 0.77 12/26/2019   BILITOT 0.8 12/26/2019   ALKPHOS 62 07/19/2019   AST 15 12/26/2019   ALT 8 12/26/2019   PROT 7.6 12/26/2019   ALBUMIN 4.6 07/19/2019   CALCIUM 9.7 12/26/2019   GFR 85.90 07/19/2019   Lab Results  Component Value Date   CHOL 178 01/03/2019   Lab Results  Component Value Date   HDL 57.60 01/03/2019   Lab Results  Component Value Date   LDLCALC 110 (H) 01/03/2019   Lab Results  Component Value Date   TRIG 53.0 01/03/2019   Lab Results  Component Value Date   CHOLHDL 3 01/03/2019   No results found for: HGBA1C     Assessment & Plan:   Problem List Items Addressed This Visit    PALPITATIONS, OCCASIONAL    Infrequent but noted recently check labs      Relevant Orders   Comprehensive metabolic panel (Completed)   CBC (Completed)   TSH   Magnesium (Completed)   Bipolar 2 disorder (HCC)    Increased irritability can discuss with psychiatry or consider PMDD with perimenopausal      Hair loss    For roughly 6-9 months. Consider pandemic stress vs perimenopause. Check labs and start on Zinc, Biotin, fish oil caps daily and increase hydration      Urinary tract infection    Recently treated for UTI at [facility] she feels better but feels she may have developed a yeast infection. Will recheck urine culture to assure resolution      Relevant Orders   Urinalysis   Urine Culture   Vaginal candida    Started on Diflucan 150 mg  tabs, 1 tab po weekly x 2 weeks      Perimenopause - Primary    She notes increased irritability, more disrupted sleep, palpitations, hair loss, fatigue. Consider perimenopause. Check labs       Relevant Orders   Estrogens, Total   Progesterone   LH   Testosterone    Other Visit Diagnoses    Insomnia, unspecified type          I am having Carollee Herter F. Corado maintain her lamoTRIgine, metoprolol tartrate, and nitrofurantoin (macrocrystal-monohydrate).  No orders of the defined types were placed in this encounter.    Danise Edge, MD

## 2019-12-26 NOTE — Assessment & Plan Note (Signed)
Started on Diflucan 150 mg tabs, 1 tab po weekly x 2 weeks

## 2019-12-26 NOTE — Assessment & Plan Note (Signed)
Infrequent but noted recently check labs

## 2019-12-26 NOTE — Assessment & Plan Note (Signed)
Recently treated for UTI at [facility] she feels better but feels she may have developed a yeast infection. Will recheck urine culture to assure resolution

## 2019-12-26 NOTE — Telephone Encounter (Signed)
[facility] calling in regards to diflucan they don't have RX

## 2019-12-26 NOTE — Patient Instructions (Signed)
Biotin, Zinc 50 mg daily and a fatty acid such as Fish, Krill or Flaxseed oil for the hair Perimenopause  Perimenopause is the normal time of life before and after menstrual periods stop completely (menopause). Perimenopause can begin 2-8 years before menopause, and it usually lasts for 1 year after menopause. During perimenopause, the ovaries may or may not produce an egg. What are the causes? This condition is caused by a natural change in hormone levels that happens as you get older. What increases the risk? This condition is more likely to start at an earlier age if you have certain medical conditions or treatments, including:  A tumor of the pituitary gland in the brain.  A disease that affects the ovaries and hormone production.  Radiation treatment for cancer.  Certain cancer treatments, such as chemotherapy or hormone (anti-estrogen) therapy.  Heavy smoking and excessive alcohol use.  Family history of early menopause. What are the signs or symptoms? Perimenopausal changes affect each woman differently. Symptoms of this condition may include:  Hot flashes.  Night sweats.  Irregular menstrual periods.  Decreased sex drive.  Vaginal dryness.  Headaches.  Mood swings.  Depression.  Memory problems or trouble concentrating.  Irritability.  Tiredness.  Weight gain.  Anxiety.  Trouble getting pregnant. How is this diagnosed? This condition is diagnosed based on your medical history, a physical exam, your age, your menstrual history, and your symptoms. Hormone tests may also be done. How is this treated? In some cases, no treatment is needed. You and your health care provider should make a decision together about whether treatment is necessary. Treatment will be based on your individual condition and preferences. Various treatments are available, such as:  Menopausal hormone therapy (MHT).  Medicines to treat specific symptoms.  Acupuncture.  Vitamin or  herbal supplements. Before starting treatment, make sure to let your health care provider know if you have a personal or family history of:  Heart disease.  Breast cancer.  Blood clots.  Diabetes.  Osteoporosis. Follow these instructions at home: Lifestyle  Do not use any products that contain nicotine or tobacco, such as cigarettes and e-cigarettes. If you need help quitting, ask your health care provider.  Eat a balanced diet that includes fresh fruits and vegetables, whole grains, soybeans, eggs, lean meat, and low-fat dairy.  Get at least 30 minutes of physical activity on 5 or more days each week.  Avoid alcoholic and caffeinated beverages, as well as spicy foods. This may help prevent hot flashes.  Get 7-8 hours of sleep each night.  Dress in layers that can be removed to help you manage hot flashes.  Find ways to manage stress, such as deep breathing, meditation, or journaling. General instructions  Keep track of your menstrual periods, including: ? When they occur. ? How heavy they are and how long they last. ? How much time passes between periods.  Keep track of your symptoms, noting when they start, how often you have them, and how long they last.  Take over-the-counter and prescription medicines only as told by your health care provider.  Take vitamin supplements only as told by your health care provider. These may include calcium, vitamin E, and vitamin D.  Use vaginal lubricants or moisturizers to help with vaginal dryness and improve comfort during sex.  Talk with your health care provider before starting any herbal supplements.  Keep all follow-up visits as told by your health care provider. This is important. This includes any group therapy or counseling.  Contact a health care provider if:  You have heavy vaginal bleeding or pass blood clots.  Your period lasts more than 2 days longer than normal.  Your periods are recurring sooner than 21  days.  You bleed after having sex. Get help right away if:  You have chest pain, trouble breathing, or trouble talking.  You have severe depression.  You have pain when you urinate.  You have severe headaches.  You have vision problems. Summary  Perimenopause is the time when a woman's body begins to move into menopause. This may happen naturally or as a result of other health problems or medical treatments.  Perimenopause can begin 2-8 years before menopause, and it usually lasts for 1 year after menopause.  Perimenopausal symptoms can be managed through medicines, lifestyle changes, and complementary therapies such as acupuncture. This information is not intended to replace advice given to you by your health care provider. Make sure you discuss any questions you have with your health care provider. Document Revised: 01/30/2017 Document Reviewed: 03/25/2016 Elsevier Patient Education  2020 [employer].

## 2019-12-26 NOTE — Assessment & Plan Note (Addendum)
For roughly 6-9 months. Consider pandemic stress vs perimenopause. Check labs and start on Zinc, Biotin, fish oil caps daily and increase hydration

## 2019-12-26 NOTE — Assessment & Plan Note (Signed)
Increased irritability can discuss with psychiatry or consider PMDD with perimenopausal

## 2019-12-26 NOTE — Assessment & Plan Note (Addendum)
She notes increased irritability, more disrupted sleep, palpitations, hair loss, fatigue. Consider perimenopause. Check labs

## 2020-01-05 LAB — CBC
HCT: 40.5 % (ref 35.0–45.0)
Hemoglobin: 14.3 g/dL (ref 11.7–15.5)
MCH: 33.3 pg — ABNORMAL HIGH (ref 27.0–33.0)
MCHC: 35.3 g/dL (ref 32.0–36.0)
MCV: 94.4 fL (ref 80.0–100.0)
MPV: 10.7 fL (ref 7.5–12.5)
Platelets: 237 10*3/uL (ref 140–400)
RBC: 4.29 10*6/uL (ref 3.80–5.10)
RDW: 11.5 % (ref 11.0–15.0)
WBC: 4.2 10*3/uL (ref 3.8–10.8)

## 2020-01-05 LAB — COMPREHENSIVE METABOLIC PANEL
AG Ratio: 1.5 (calc) (ref 1.0–2.5)
ALT: 8 U/L (ref 6–29)
AST: 15 U/L (ref 10–30)
Albumin: 4.6 g/dL (ref 3.6–5.1)
Alkaline phosphatase (APISO): 62 U/L (ref 31–125)
BUN: 8 mg/dL (ref 7–25)
CO2: 28 mmol/L (ref 20–32)
Calcium: 9.7 mg/dL (ref 8.6–10.2)
Chloride: 104 mmol/L (ref 98–110)
Creat: 0.77 mg/dL (ref 0.50–1.10)
Globulin: 3 g/dL (calc) (ref 1.9–3.7)
Glucose, Bld: 89 mg/dL (ref 65–99)
Potassium: 4.1 mmol/L (ref 3.5–5.3)
Sodium: 139 mmol/L (ref 135–146)
Total Bilirubin: 0.8 mg/dL (ref 0.2–1.2)
Total Protein: 7.6 g/dL (ref 6.1–8.1)

## 2020-01-05 LAB — ESTROGENS, TOTAL: Estrogen: 189.9 pg/mL

## 2020-01-05 LAB — TESTOSTERONE: Testosterone: 15 ng/dL

## 2020-01-05 LAB — MAGNESIUM: Magnesium: 2 mg/dL (ref 1.5–2.5)

## 2020-01-05 LAB — TSH: TSH: 1.63 mIU/L

## 2020-01-05 LAB — PROGESTERONE: Progesterone: <0.5 ng/mL

## 2020-01-05 LAB — LUTEINIZING HORMONE: LH: 5.5 m[IU]/mL

## 2020-06-14 ENCOUNTER — Other Ambulatory Visit: Payer: Self-pay

## 2020-06-14 ENCOUNTER — Ambulatory Visit: Payer: BC Managed Care – PPO | Admitting: Family Medicine

## 2020-06-14 ENCOUNTER — Encounter: Payer: Self-pay | Admitting: Family Medicine

## 2020-06-14 DIAGNOSIS — F3181 Bipolar II disorder: Secondary | ICD-10-CM | POA: Diagnosis not present

## 2020-06-14 DIAGNOSIS — R002 Palpitations: Secondary | ICD-10-CM

## 2020-06-14 DIAGNOSIS — N951 Menopausal and female climacteric states: Secondary | ICD-10-CM

## 2020-06-14 NOTE — Progress Notes (Signed)
Patient ID: Valerie Ruiz, female    DOB: [birth year]  Age: 45 y.o. MRN: [id number]    Subjective:  Subjective  HPI Valerie Ruiz presents for office visit today. She states that she is doing well today and that she is managing with current fatigue symptoms. She denies any chest pain, SOB, fever, abdominal pain, cough, chills, sore throat, dysuria, urinary incontinence, back pain, HA, or N/VD. She states that she discovered that some of her fatigue symptoms were because of the side effects of the Lamictal medication since when her psychiatrist have lowered her dosage to 200 mg, her symptoms have improved.    Review of Systems  Constitutional: Negative for chills, fatigue and fever.  HENT: Negative for congestion, rhinorrhea, sinus pressure, sinus pain and sore throat.   Eyes: Negative for pain.  Respiratory: Negative for cough and shortness of breath.   Cardiovascular: Negative for chest pain, palpitations and leg swelling.  Gastrointestinal: Negative for abdominal pain, blood in stool, diarrhea, nausea and vomiting.  Genitourinary: Negative for decreased urine volume, flank pain, frequency, vaginal bleeding and vaginal discharge.  Musculoskeletal: Negative for back pain.  Neurological: Negative for headaches.    History Past Medical History:  Diagnosis Date  . Acute bronchitis 08/28/2015  . Allergic rhinitis   . Anemia 05/08/2016  . Asthma   . Chronic headaches   . Depression   . Glaucoma   . History of ovulatory pain 09/10/2014  . LLQ pain 09/10/2014  . Preventative health care 12/24/2014    She has a past surgical history that includes Ankle surgery.   Her family history includes Breast cancer in her paternal grandmother; Cancer in her paternal grandmother; Gout in her father; Heart disease (age of onset: 34) in her maternal grandmother; Hyperthyroidism in her mother; Stroke in her maternal grandfather.She reports that she has never smoked. She has never used smokeless tobacco.  She reports that she does not drink alcohol and does not use drugs.  Current Outpatient Medications on File Prior to Visit  Medication Sig Dispense Refill  . LATUDA 40 MG TABS tablet Take 40 mg by mouth at bedtime.    . metoprolol tartrate (LOPRESSOR) 25 MG tablet Take 0.5 tablets (12.5 mg total) by mouth 2 (two) times daily. 90 tablet 3  . lamoTRIgine (LAMICTAL) 200 MG tablet Take 1 tablet (200 mg total) by mouth at bedtime.     No current facility-administered medications on file prior to visit.     Objective:  Objective  Physical Exam Constitutional:      General: She is not in acute distress.    Appearance: Normal appearance. She is not ill-appearing or toxic-appearing.  HENT:     Head: Normocephalic and atraumatic.     Right Ear: Tympanic membrane, ear canal and external ear normal.     Left Ear: Tympanic membrane, ear canal and external ear normal.     Nose: No congestion or rhinorrhea.  Eyes:     Extraocular Movements: Extraocular movements intact.     Pupils: Pupils are equal, round, and reactive to light.  Cardiovascular:     Rate and Rhythm: Normal rate and regular rhythm.     Pulses: Normal pulses.     Heart sounds: Normal heart sounds. No murmur heard.   Pulmonary:     Effort: Pulmonary effort is normal. No respiratory distress.     Breath sounds: Normal breath sounds. No wheezing, rhonchi or rales.  Abdominal:     General: Bowel sounds are normal.  Palpations: Abdomen is soft. There is no mass.     Tenderness: There is no abdominal tenderness. There is no guarding.     Hernia: No hernia is present.  Musculoskeletal:        General: Normal range of motion.     Cervical back: Normal range of motion and neck supple.  Skin:    General: Skin is warm and dry.  Neurological:     Mental Status: She is alert and oriented to person, place, and time.  Psychiatric:        Behavior: Behavior normal.    BP 114/68   Pulse 83   Temp 97.7 F (36.5 C)   Resp 16    Wt 145 lb 6.4 oz (66 kg)   SpO2 98%   BMI 26.59 kg/m  Wt Readings from Last 3 Encounters:  06/14/20 145 lb 6.4 oz (66 kg)  12/26/19 139 lb 3.2 oz (63.1 kg)  12/13/19 140 lb (63.5 kg)     Lab Results  Component Value Date   WBC 4.2 12/26/2019   HGB 14.3 12/26/2019   HCT 40.5 12/26/2019   PLT 237 12/26/2019   GLUCOSE 89 12/26/2019   CHOL 178 01/03/2019   TRIG 53.0 01/03/2019   HDL 57.60 01/03/2019   LDLCALC 110 (H) 01/03/2019   ALT 8 12/26/2019   AST 15 12/26/2019   NA 139 12/26/2019   K 4.1 12/26/2019   CL 104 12/26/2019   CREATININE 0.77 12/26/2019   BUN 8 12/26/2019   CO2 28 12/26/2019   TSH 1.63 12/26/2019    No results found.   Assessment & Plan:  Plan    No orders of the defined types were placed in this encounter.   Problem List Items Addressed This Visit    PALPITATIONS, OCCASIONAL    Well controlled on metoprolol      Bipolar 2 disorder (HCC)    She is doing much better after her psychiatrist dropped her lamictal to 200 mg just qhs and added Latuda 40 mg qhs      Perimenopause    Symptoms of concern have resolved since the adjustment of her Lamictal dose         Follow-up: Return in about 6 months (around 12/14/2020) for annual exam.   I,David Hanna,acting as a scribe for Danise Edge, MD.,have documented all relevant documentation on the behalf of Danise Edge, MD,as directed by  Danise Edge, MD while in the presence of Danise Edge, MD.  I, [name], MD personally performed the services described in this documentation. All medical record entries made by the scribe were at my direction and in my presence. I have reviewed the chart and agree that the record reflects my personal performance and is accurate and complete

## 2020-06-14 NOTE — Patient Instructions (Signed)
Mammogram due after 07/10/20 Palpitations Palpitations are feelings that your heartbeat is irregular or is faster than normal. It may feel like your heart is fluttering or skipping a beat. Palpitations are usually not a serious problem. They may be caused by many things, including smoking, caffeine, alcohol, stress, and certain medicines or drugs. Most causes of palpitations are not serious. However, some palpitations can be a sign of a serious problem. You may need further tests to rule out serious medical problems. Follow these instructions at home: Pay attention to any changes in your condition. Take these actions to help manage your symptoms: Eating and drinking  Avoid foods and drinks that may cause palpitations. These may include: ? Caffeinated coffee, tea, soft drinks, diet pills, and energy drinks. ? Chocolate. ? Alcohol. Lifestyle  Take steps to reduce your stress and anxiety. Things that can help you relax include: ? Yoga. ? Mind-body activities, such as deep breathing, meditation, or using words and images to create positive thoughts (guided imagery). ? Physical activity, such as swimming, jogging, or walking. Tell your health care provider if your palpitations increase with activity. If you have chest pain or shortness of breath with activity, do not continue the activity until you are seen by your health care provider. ? Biofeedback. This is a method that helps you learn to use your mind to control things in your body, such as your heartbeat.  Do not use drugs, including cocaine or ecstasy. Do not use marijuana.  Get plenty of rest and sleep. Keep a regular bed time. General instructions  Take over-the-counter and prescription medicines only as told by your health care provider.  Do not use any products that contain nicotine or tobacco, such as cigarettes and e-cigarettes. If you need help quitting, ask your health care provider.  Keep all follow-up visits as told by your  health care provider. This is important. These may include visits for further testing if palpitations do not go away or get worse.      Contact a health care provider if you:  Continue to have a fast or irregular heartbeat after 24 hours.  Notice that your palpitations occur more often. Get help right away if you:  Have chest pain or shortness of breath.  Have a severe headache.  Feel dizzy or you faint. Summary  Palpitations are feelings that your heartbeat is irregular or is faster than normal. It may feel like your heart is fluttering or skipping a beat.  Palpitations may be caused by many things, including smoking, caffeine, alcohol, stress, certain medicines, and drugs.  Although most causes of palpitations are not serious, some causes can be a sign of a serious medical problem.  Get help right away if you faint or have chest pain, shortness of breath, a severe headache, or dizziness. This information is not intended to replace advice given to you by your health care provider. Make sure you discuss any questions you have with your health care provider. Document Revised: 04/01/2017 Document Reviewed: 04/01/2017 Elsevier Patient Education  2021 [employer].

## 2020-06-14 NOTE — Assessment & Plan Note (Signed)
Well controlled on metoprolol.

## 2020-06-14 NOTE — Assessment & Plan Note (Signed)
Symptoms of concern have resolved since the adjustment of her Lamictal dose

## 2020-06-14 NOTE — Assessment & Plan Note (Signed)
She is doing much better after her psychiatrist dropped her lamictal to 200 mg just qhs and added Latuda 40 mg qhs

## 2020-06-26 ENCOUNTER — Ambulatory Visit: Payer: BC Managed Care – PPO | Admitting: Family Medicine

## 2020-08-02 ENCOUNTER — Other Ambulatory Visit: Payer: BC Managed Care – PPO

## 2020-08-02 ENCOUNTER — Ambulatory Visit: Payer: BC Managed Care – PPO | Attending: Critical Care Medicine

## 2020-08-02 DIAGNOSIS — Z20822 Contact with and (suspected) exposure to covid-19: Secondary | ICD-10-CM

## 2020-08-03 LAB — SARS-COV-2, NAA 2 DAY TAT

## 2020-08-03 LAB — NOVEL CORONAVIRUS, NAA: SARS-CoV-2, NAA: NOT DETECTED

## 2020-10-09 ENCOUNTER — Encounter: Payer: Self-pay | Admitting: Family Medicine

## 2020-10-09 DIAGNOSIS — Z1231 Encounter for screening mammogram for malignant neoplasm of breast: Secondary | ICD-10-CM

## 2020-10-10 ENCOUNTER — Other Ambulatory Visit: Payer: Self-pay | Admitting: Family Medicine

## 2020-11-05 ENCOUNTER — Other Ambulatory Visit (HOSPITAL_BASED_OUTPATIENT_CLINIC_OR_DEPARTMENT_OTHER): Payer: Self-pay | Admitting: Family Medicine

## 2020-11-05 DIAGNOSIS — Z1231 Encounter for screening mammogram for malignant neoplasm of breast: Secondary | ICD-10-CM

## 2020-11-08 DIAGNOSIS — Z1231 Encounter for screening mammogram for malignant neoplasm of breast: Secondary | ICD-10-CM

## 2021-01-01 ENCOUNTER — Other Ambulatory Visit (HOSPITAL_BASED_OUTPATIENT_CLINIC_OR_DEPARTMENT_OTHER): Payer: Self-pay | Admitting: Family Medicine

## 2021-01-01 DIAGNOSIS — Z1231 Encounter for screening mammogram for malignant neoplasm of breast: Secondary | ICD-10-CM

## 2021-01-02 ENCOUNTER — Ambulatory Visit (INDEPENDENT_AMBULATORY_CARE_PROVIDER_SITE_OTHER): Payer: BC Managed Care – PPO

## 2021-01-02 ENCOUNTER — Other Ambulatory Visit: Payer: Self-pay

## 2021-01-02 DIAGNOSIS — Z1231 Encounter for screening mammogram for malignant neoplasm of breast: Secondary | ICD-10-CM

## 2021-01-21 ENCOUNTER — Other Ambulatory Visit: Payer: Self-pay

## 2021-01-22 ENCOUNTER — Encounter: Payer: BC Managed Care – PPO | Admitting: Family Medicine

## 2021-02-13 ENCOUNTER — Other Ambulatory Visit (HOSPITAL_COMMUNITY)
Admission: RE | Admit: 2021-02-13 | Discharge: 2021-02-13 | Disposition: A | Discharge status: Home / Self Care | Payer: BC Managed Care – PPO | Source: Ambulatory Visit | Attending: Family Medicine | Admitting: Family Medicine

## 2021-02-13 ENCOUNTER — Other Ambulatory Visit: Payer: Self-pay

## 2021-02-13 ENCOUNTER — Encounter: Payer: Self-pay | Admitting: Family Medicine

## 2021-02-13 ENCOUNTER — Ambulatory Visit (INDEPENDENT_AMBULATORY_CARE_PROVIDER_SITE_OTHER): Payer: BC Managed Care – PPO | Admitting: Family Medicine

## 2021-02-13 VITALS — BP 98/64 | HR 80 | Ht 62.0 in | Wt 149.0 lb

## 2021-02-13 DIAGNOSIS — Z01419 Encounter for gynecological examination (general) (routine) without abnormal findings: Secondary | ICD-10-CM

## 2021-02-13 NOTE — Progress Notes (Signed)
GYNECOLOGY ANNUAL PREVENTATIVE CARE ENCOUNTER NOTE  Subjective:   Valerie Ruiz is a 45 y.o. G0P0000 female here for a routine annual gynecologic exam.  Current complaints: none. Denies perimenopausal symptoms. Having regular periods.  Denies abnormal vaginal bleeding, discharge, pelvic pain, problems with intercourse or other gynecologic concerns.    Gynecologic History Patient's last menstrual period was 01/26/2021. Patient is sexually active  Contraception:  same gender relationship Last Pap:  Lab Results  Component Value Date   DIAGPAP  01/07/2018    NEGATIVE FOR INTRAEPITHELIAL LESIONS OR MALIGNANCY. BENIGN REACTIVE/REPARATIVE CHANGES.   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   HPV NOT DETECTED 01/07/2018   Last mammogram: 01/2021. Results were: [facility] 1 Colorectal Cancer Screening: n/a.  Obstetric History OB History  Gravida Para Term Preterm AB Living  0 0 0 0 0 0  SAB IAB Ectopic Multiple Live Births  0 0 0 0 0    Past Medical History:  Diagnosis Date   Acute bronchitis 08/28/2015   Allergic rhinitis    Anemia 05/08/2016   Asthma    Chronic headaches    Depression    Glaucoma    History of ovulatory pain 09/10/2014   LLQ pain 09/10/2014   Preventative health care 12/24/2014    Past Surgical History:  Procedure Laterality Date   ANKLE SURGERY     Broken ankle    Current Outpatient Medications on File Prior to Visit  Medication Sig Dispense Refill   lamoTRIgine (LAMICTAL) 200 MG tablet Take 1 tablet (200 mg total) by mouth at bedtime.     LATUDA 40 MG TABS tablet Take 40 mg by mouth at bedtime.     metoprolol tartrate (LOPRESSOR) 25 MG tablet Take 0.5 tablets (12.5 mg total) by mouth 2 (two) times daily. 90 tablet 3   No current facility-administered medications on file prior to visit.    No Known Allergies  Social History   Socioeconomic History   Marital status: Married    Spouse name:  Not on file   Number of children: Not on file   Years of education: Not on file   Highest education level: Not on file  Occupational History   Not on file  Tobacco Use   Smoking status: Never   Smokeless tobacco: Never  Substance and Sexual Activity   Alcohol use: No    Alcohol/week: 3.0 standard drinks    Types: 3 Standard drinks or equivalent per week   Drug use: No   Sexual activity: Yes    Partners: Female    Comment: lives with spouse, works as Designer, industrial/product support at [employer]   Other Topics Concern   Not on file  Social History Narrative   Not on file   Social Determinants of Health   Financial Resource Strain: Not on file  Food Insecurity: Not on file  Transportation Needs: Not on file  Physical Activity: Not on file  Stress: Not on file  Social Connections: Not on file  Intimate Partner Violence: Not on file    Family History  Problem Relation Age of Onset   Hyperthyroidism Mother    Breast cancer Paternal Grandmother    Cancer Paternal Grandmother        metastatic breast with mets to bone   Gout Father    Heart disease Maternal Grandmother 33       MI   Stroke Maternal Grandfather     The following  portions of the patient's history were reviewed and updated as appropriate: allergies, current medications, past family history, past medical history, past social history, past surgical history and problem list.  Review of Systems Pertinent items are noted in HPI.   Objective:  BP 98/64    Pulse 80    Ht 5\' 2"  (1.575 m)    Wt 149 lb (67.6 kg)    LMP 01/26/2021    BMI 27.25 kg/m  Wt Readings from Last 3 Encounters:  02/13/21 149 lb (67.6 kg)  06/14/20 145 lb 6.4 oz (66 kg)  12/26/19 139 lb 3.2 oz (63.1 kg)     Chaperone present during exam  CONSTITUTIONAL: Well-developed, well-nourished female in no acute distress.  HENT:  Normocephalic, atraumatic, External right and left ear normal. Oropharynx is clear and moist EYES:  Conjunctivae and EOM are normal. Pupils are equal, round, and reactive to light. No scleral icterus.  NECK: Normal range of motion, supple, no masses.  Normal thyroid.   CARDIOVASCULAR: Normal heart rate noted, regular rhythm RESPIRATORY: Clear to auscultation bilaterally. Effort and breath sounds normal, no problems with respiration noted. BREASTS: Symmetric in size. No masses, skin changes, nipple drainage, or lymphadenopathy. ABDOMEN: Soft, normal bowel sounds, no distention noted.  No tenderness, rebound or guarding.  PELVIC: Normal appearing external genitalia; normal appearing vaginal mucosa and cervix.  No abnormal discharge noted.  Normal uterine size, no other palpable masses, no uterine or adnexal tenderness. MUSCULOSKELETAL: Normal range of motion. No tenderness.  No cyanosis, clubbing, or edema.  2+ distal pulses. SKIN: Skin is warm and dry. No rash noted. Not diaphoretic. No erythema. No pallor. NEUROLOGIC: Alert and oriented to person, place, and time. Normal reflexes, muscle tone coordination. No cranial nerve deficit noted. PSYCHIATRIC: Normal mood and affect. Normal behavior. Normal judgment and thought content.  Assessment:  Annual gynecologic examination with pap smear   Plan:  1. Well Woman Exam Will follow up results of pap smear and manage accordingly. Mammogram reviewed STD testing discussed. Patient declined testing - Cytology - PAP( [facility])   Routine preventative health maintenance measures emphasized. Please refer to After Visit Summary for other counseling recommendations.    12/28/19, DO [facility]

## 2021-02-14 LAB — CYTOLOGY - PAP
Comment: NEGATIVE
Diagnosis: NEGATIVE
High risk HPV: NEGATIVE

## 2021-06-28 ENCOUNTER — Ambulatory Visit: Payer: BC Managed Care – PPO | Admitting: Psychology

## 2021-06-28 DIAGNOSIS — F411 Generalized anxiety disorder: Secondary | ICD-10-CM | POA: Diagnosis not present

## 2021-06-28 NOTE — Progress Notes (Signed)
[facility] Counselor Initial Adult Exam ? ?Name: Valerie Ruiz ?Date: 06/28/2021 ?MRN: [id number] ?DOB: [birth year] ?PCP: [name], MD ? ?Time spent: 10:00am - !0:55am    55 minutes ? ?Guardian/Payee:  n/a   ? ?Paperwork requested: No  ? ?Reason for Visit /Presenting Problem:  ?Pt present for face-to-face initial assessment via video Webex.  Pt consents to telehealth video session due to COVID 19 pandemic. ?Location of pt: home ?Location of therapist: home office.  ?Pt states that for the past year she has started to have anxiety.  Pt has history of depression but states it is well managed.  Pt sees Dr. Evelene Croon for psychiatric medications.  Pt is prescribed Lamictal and Latuda.   Pt's medications have helped with her depression and mood swings.   ?Pt is not sure what has triggered her anxiety.  Pt tends to have a sense that "something is wrong" and then she gets the physical symptoms of anxiety. It happens when she is alone mostly.  It doesn't happen much in social situations.  The anxiety occurs a lot when she is driving.   ?Pt gets anxious about getting anxious.  Pt feels anxious daily intermittently.   When pt is anxious it disrupts her sleep at times.   ?Pt has tried to talk herself through the anxiety but it is not really helpful.   ?Pt started graduate school this past August.  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   Pt states graduate school has been going well.  She does have trouble focusing at times which gets frustrating for pt.   ?Pt states there are not other life stressors.  Her job is going well and her relationship with her partner is good.   ? ?Mental Status Exam: ?Appearance:   Casual     ?Behavior:  Appropriate  ?Motor:  Normal  ?Speech/Language:   Normal Rate  ?Affect:  Appropriate  ?Mood:  normal  ?Thought process:  normal  ?Thought content:    WNL  ?Sensory/Perceptual disturbances:    WNL  ?Orientation:   oriented to person, place, time/date, and situation  ?Attention:  Good  ?Concentration:  Good  ?Memory:  WNL  ?Fund of knowledge:   Good  ?Insight:    Good  ?Judgment:   Good  ?Impulse Control:  Good  ? ? ?Reported Symptoms:  anxiety ? ?Risk Assessment: ?Danger to Self:  No ?Self-injurious Behavior: No ?Danger to Others: No ?Duty to Warn:no ?Physical Aggression / Violence:No  ?Access to Firearms a concern: No  ?Gang Involvement:No  ?Patient / guardian was educated about steps to take if suicide or homicide risk level increases between visits: n/a ?While future psychiatric events cannot be accurately predicted, the patient does not currently require acute inpatient psychiatric care and does not currently meet [facility] involuntary commitment criteria. ? ?Substance Abuse History: ?Current substance abuse: No    ? ?Past Psychiatric History:   ?Previous psychological history is significant for depression ?Outpatient Providers:pt has been in therapy with this therapist in 2019. ?History of Psych Hospitalization: No  ?Psychological Testing:  n/a   ? ?Abuse History:  ?Victim of: No.,  n/a    ?Report needed: No. ?Victim of Neglect:No. ?Perpetrator of  n/a   ?Witness / Exposure to Domestic Violence: No   ?Protective Services Involvement: No  ?Witness to [employer] Violence:  No  ? ?Family History:  ?Family History  ?Problem Relation Age of Onset  ?  Hyperthyroidism Mother   ? Breast cancer Paternal Grandmother   ? Cancer Paternal Grandmother   ?     metastatic breast with mets to bone  ? Gout Father   ? Heart disease Maternal Grandmother [id number]  ?     MI  ? Stroke Maternal Grandfather   ? ? ?Living situation: the patient lives with her partner. ? ?Pt grew up with mother and father .   Pt is only child.  Pt had good childhood.  No childhood abuse.   ?Family history of depression in PGM and Maternal aunt, uncle, and cousin.  Family history alcoholism on both sides.   ? ?Sexual Orientation: Gay ? ?Relationship Status: same sex  partner  ?Name of spouse / other:Christin.  Pt and Christin have been together for 21 years.  Pt states they have a good relationship.   ?If a parent, number of children / ages:none ? ?Support Systems: significant other ?parents ? ?Financial Stress:  No  ? ?Income/Employment/Disability: Employment ?Pt works at [employer].  ? ?Military Service: No  ? ?Educational History: ?Education: college graduate ? ?Religion/Sprituality/World View: ?No religious affiliation ? ?Any cultural differences that may affect / interfere with treatment:  not applicable  ? ?Recreation/Hobbies: pt enjoys gardening and taking walks.  ? ?Stressors: Other: anxiety   ? ?Strengths: Supportive Relationships, Hopefulness, Self Advocate, and Able to Communicate Effectively ? ?Barriers:  none  ? ?Legal History: ?Pending legal issue / charges: The patient has no significant history of legal issues. ?History of legal issue / charges:  n/a ? ?Medical History/Surgical History: reviewed ?Past Medical History:  ?Diagnosis Date  ? Acute bronchitis 08/28/2015  ? Allergic rhinitis   ? Anemia 05/08/2016  ? Asthma   ? Chronic headaches   ? Depression   ? Glaucoma   ? History of ovulatory pain 09/10/2014  ? LLQ pain 09/10/2014  ? Preventative health care 12/24/2014  ? ? ?Past Surgical History:  ?Procedure Laterality Date  ? ANKLE SURGERY    ? Broken ankle  ? ? ?Medications: ?Current Outpatient Medications  ?Medication Sig Dispense Refill  ? lamoTRIgine (LAMICTAL) 200 MG tablet Take 1 tablet (200 mg total) by mouth at bedtime.    ? LATUDA 40 MG TABS tablet Take 40 mg by mouth at bedtime.    ? metoprolol tartrate (LOPRESSOR) 25 MG tablet Take 0.5 tablets (12.5 mg total) by mouth 2 (two) times daily. 90 tablet 3  ? ?No current facility-administered medications for this visit.  ? ? ?No Known Allergies ? 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 ?Plan of Care: Recommend ongoing therapy.   Pt participated in setting treatment goals.   Pt wants to decrease anxiety and improve coping skills.    ?Recommended pt start thought journaling and doing relaxation exercises using apps like Calm or Headspace.   Plan to meet every two weeks.  ? ?Treatment Plan  (Treatment Plan Target Date:  06/29/2022) ?Client Abilities/Strengths  ?Pt is bright, engaging and motivated for therapy.  ?Client Treatment Preferences  ?Individual therapy.  ?Client Statement of Needs  ?Improve coping skills.  ?Symptoms  ?Autonomic hyperactivity (e.g., palpitations, shortness of breath, dry mouth, trouble swallowing, nausea, diarrhea). Excessive and/or unrealistic worry that is difficult to control occurring more days than not for at least 6 months about a number of events or activities. Hypervigilance (e.g., feeling constantly on edge, experiencing concentration difficulties, having trouble falling or staying asleep, exhibiting a general state of irritability). Motor tension (e.g., restlessness, tiredness, shakiness, muscle tension). ?Problems Addressed  ?Anxiety ?  Goals ?1. Enhance ability to effectively cope with the full variety of life's worries and anxieties. ?2. Learn and implement coping skills that result in a reduction of anxiety and worry, and improved daily functioning. ?Objective ?Learn to accept limitations in life and commit to tolerating, rather than avoiding, unpleasant emotions while accomplishing meaningful goals. ?Target Date: 2022-06-29  Frequency: Biweekly ?Progress: 10 Modality: individual ?Related Interventions ?1. Use techniques from Acceptance and Commitment Therapy to help client accept uncomfortable realities such as lack of complete control, imperfections, and uncertainty and tolerate unpleasant emotions and thoughts in order to accomplish value-consistent goals. ?Objective ?Learn and implement problem-solving strategies for realistically addressing worries. ?Target Date: 2022-06-29  Frequency: Biweekly ?Progress: 10 Modality: individual ?Related Interventions ?1. Assign the client a homework exercise in which  he/she problem-solves a current problem.  review, reinforce success, and provide corrective feedback toward improvement. ?2. Teach the client problem-solving strategies involving specifically defining a

## 2021-07-03 NOTE — Progress Notes (Signed)
? ?Subjective:  ? ? Patient ID: Valerie MoriShannon F Ruiz, female    DOB: 07/26/1975, 46 y.o.   MRN: [id number] ? ?Chief Complaint  ?Patient presents with  ? Annual Exam  ? ? ?HPI ?Patient is in today for her annual physical exam. Overall she is idoing well. No recent febrile illness or hospitalizations. She is largely able to maintain a heart healthy diet and she stays active. Denies CP/palp/SOB/HA/congestion/fevers/GI or GU c/o. Taking meds as prescribed  ? ?Past Medical History:  ?Diagnosis Date  ? Acute bronchitis 08/28/2015  ? Allergic rhinitis   ? Anemia 05/08/2016  ? Asthma   ? Chronic headaches   ? Depression   ? Glaucoma   ? History of ovulatory pain 09/10/2014  ? Hyperlipidemia 07/04/2021  ? LLQ pain 09/10/2014  ? Preventative health care 12/24/2014  ? ? ?Past Surgical History:  ?Procedure Laterality Date  ? ANKLE SURGERY    ? Broken ankle  ? ? ?Family History  ?Problem Relation Age of Onset  ? Hyperthyroidism Mother   ? Obesity Mother   ? Breast cancer Paternal Grandmother   ? Cancer Paternal Grandmother   ?     metastatic breast with mets to bone  ? Depression Paternal Grandmother   ? Gout Father   ? Stroke Father   ? Heart disease Maternal Grandmother [phone]  ?     MI  ? Stroke Maternal Grandfather   ? Alcohol abuse Maternal Grandfather   ? COPD Maternal Aunt   ? ? ?Social History  ? ?Socioeconomic History  ? Marital status: Married  ?  Spouse name: Not on file  ? Number of children: Not on file  ? Years of education: Not on file  ? Highest education level: Not on file  ?Occupational History  ? Not on file  ?Tobacco Use  ? Smoking status: Never  ? Smokeless tobacco: Never  ?Substance and Sexual Activity  ? Alcohol use: No  ?  Alcohol/week: 3.0 standard drinks  ?  Types: 3 Standard drinks or equivalent per week  ? Drug use: No  ? Sexual activity: Yes  ?  Partners: Female  ?  Birth control/protection: None  ?  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 Not on file  ?Social History Narrative  ? Not on file  ? ?Social Determinants of Health  ? ?Financial Resource Strain: Not on file  ?Food Insecurity: Not on file  ?Transportation Needs: Not on file  ?Physical Activity: Not on file  ?Stress: Not on file  ?Social Connections: Not on file  ?Intimate Partner Violence: Not on file  ? ? ?Outpatient Medications Prior to Visit  ?Medication Sig Dispense Refill  ? lamoTRIgine (LAMICTAL) 200 MG tablet Take 1 tablet (200 mg total) by mouth at bedtime.    ? lurasidone (LATUDA) 20 MG TABS tablet Take 20 mg by mouth at bedtime.    ? metoprolol tartrate (LOPRESSOR) 25 MG tablet Take 0.5 tablets (12.5 mg total) by mouth 2 (two) times daily. 90 tablet 3  ? ?No facility-administered medications prior to visit.  ? ? ?No Known Allergies ? ?Review of Systems  ?Constitutional:  Negative for chills, fever and malaise/fatigue.  ?HENT:  Negative for congestion and hearing loss.   ?Eyes:  Negative for discharge.  ?Respiratory:  Negative for cough, sputum production and shortness of breath.   ?Cardiovascular:  Negative for chest pain, palpitations and leg swelling.  ?Gastrointestinal:  Negative  for abdominal pain, blood in stool, constipation, diarrhea, heartburn, nausea and vomiting.  ?Genitourinary:  Negative for dysuria, frequency, hematuria and urgency.  ?Musculoskeletal:  Negative for back pain, falls and myalgias.  ?Skin:  Negative for rash.  ?Neurological:  Negative for dizziness, sensory change, loss of consciousness, weakness and headaches.  ?Endo/Heme/Allergies:  Negative for environmental allergies. Does not bruise/bleed easily.  ?Psychiatric/Behavioral:  Negative for depression and suicidal ideas. The patient is not nervous/anxious and does not have insomnia.   ? ?   ?Objective:  ?  ?Physical Exam ?Constitutional:   ?   General: She is not in acute distress. ?   Appearance: She is well-developed.  ?HENT:  ?   Head: Normocephalic and atraumatic.  ?Eyes:  ?    Conjunctiva/sclera: Conjunctivae normal.  ?Neck:  ?   Thyroid: No thyromegaly.  ?Cardiovascular:  ?   Rate and Rhythm: Normal rate and regular rhythm.  ?   Heart sounds: Normal heart sounds. No murmur heard. ?Pulmonary:  ?   Effort: Pulmonary effort is normal. No respiratory distress.  ?   Breath sounds: Normal breath sounds.  ?Abdominal:  ?   General: Bowel sounds are normal. There is no distension.  ?   Palpations: Abdomen is soft. There is no mass.  ?   Tenderness: There is no abdominal tenderness.  ?Musculoskeletal:  ?   Cervical back: Neck supple.  ?Lymphadenopathy:  ?   Cervical: No cervical adenopathy.  ?Skin: ?   General: Skin is warm and dry.  ?Neurological:  ?   Mental Status: She is alert and oriented to person, place, and time.  ?Psychiatric:     ?   Behavior: Behavior normal.  ? ? ?BP 122/82 (BP Location: Right Arm, Patient Position: Sitting, Cuff Size: Normal)   Pulse 84   Resp 20   Ht 5\' 2"  (1.575 m)   Wt 149 lb 3.2 oz (67.7 kg)   SpO2 98%   BMI 27.29 kg/m?  ?Wt Readings from Last 3 Encounters:  ?07/04/21 149 lb 3.2 oz (67.7 kg)  ?02/13/21 149 lb (67.6 kg)  ?06/14/20 145 lb 6.4 oz (66 kg)  ? ? ?Diabetic Foot Exam - Simple   ?No data filed ?  ? ?Lab Results  ?Component Value Date  ? WBC 6.7 07/04/2021  ? HGB 14.5 07/04/2021  ? HCT 41.9 07/04/2021  ? PLT 240.0 07/04/2021  ? GLUCOSE 77 07/04/2021  ? CHOL 183 07/04/2021  ? TRIG 84.0 07/04/2021  ? HDL 60.60 07/04/2021  ? LDLCALC 105 (H) 07/04/2021  ? ALT 12 07/04/2021  ? AST 16 07/04/2021  ? NA 141 07/04/2021  ? K 4.1 07/04/2021  ? CL 102 07/04/2021  ? CREATININE 0.64 07/04/2021  ? BUN 14 07/04/2021  ? CO2 30 07/04/2021  ? TSH 1.06 07/04/2021  ? ? ?Lab Results  ?Component Value Date  ? TSH 1.06 07/04/2021  ? ?Lab Results  ?Component Value Date  ? WBC 6.7 07/04/2021  ? HGB 14.5 07/04/2021  ? HCT 41.9 07/04/2021  ? MCV 94.0 07/04/2021  ? PLT 240.0 07/04/2021  ? ?Lab Results  ?Component Value Date  ? NA 141 07/04/2021  ? K 4.1 07/04/2021  ? CO2 30  07/04/2021  ? GLUCOSE 77 07/04/2021  ? BUN 14 07/04/2021  ? CREATININE 0.64 07/04/2021  ? BILITOT 0.6 07/04/2021  ? ALKPHOS 61 07/04/2021  ? AST 16 07/04/2021  ? ALT 12 07/04/2021  ? PROT 7.3 07/04/2021  ? ALBUMIN 4.5 07/04/2021  ? CALCIUM 9.4 07/04/2021  ?  GFR 107.52 07/04/2021  ? ?Lab Results  ?Component Value Date  ? CHOL 183 07/04/2021  ? ?Lab Results  ?Component Value Date  ? HDL 60.60 07/04/2021  ? ?Lab Results  ?Component Value Date  ? LDLCALC 105 (H) 07/04/2021  ? ?Lab Results  ?Component Value Date  ? TRIG 84.0 07/04/2021  ? ?Lab Results  ?Component Value Date  ? CHOLHDL 3 07/04/2021  ? ?No results found for: HGBA1C ? ?   ?Assessment & Plan:  ? ?Problem List Items Addressed This Visit   ? ? PALPITATIONS, OCCASIONAL  ? Relevant Orders  ? TSH (Completed)  ? Comprehensive metabolic panel (Completed)  ? CBC (Completed)  ? Preventative health care - Primary  ?  Patient encouraged to maintain heart healthy diet, regular exercise, adequate sleep. Consider daily probiotics. Take medications as prescribed. Labs ordered and reviewed. Referred for screening colonoscopy at end of 2023. Pap 12/22, repeat every 3-5 years.MM 11/22 repeat every 1-2 years. ? ?  ?  ? Relevant Orders  ? TSH (Completed)  ? Lipid panel (Completed)  ? Comprehensive metabolic panel (Completed)  ? CBC (Completed)  ? Anemia  ? Relevant Orders  ? CBC (Completed)  ? Hyperlipidemia  ?  Encourage heart healthy diet such as MIND or DASH diet, increase exercise, avoid trans fats, simple carbohydrates and processed foods, consider a krill or fish or flaxseed oil cap daily.  ? ?  ?  ? ?Other Visit Diagnoses   ? ? Colon cancer screening      ? Relevant Orders  ? Ambulatory referral to Gastroenterology  ? ?  ? ? ?I am having Veverly Fells. Mccanless maintain her lurasidone, lamoTRIgine, and metoprolol tartrate. ? ?No orders of the defined types were placed in this encounter. ? ? ? ?

## 2021-07-04 ENCOUNTER — Ambulatory Visit (INDEPENDENT_AMBULATORY_CARE_PROVIDER_SITE_OTHER): Payer: BC Managed Care – PPO | Admitting: Family Medicine

## 2021-07-04 ENCOUNTER — Encounter: Payer: Self-pay | Admitting: Family Medicine

## 2021-07-04 VITALS — BP 122/82 | HR 84 | Resp 20 | Ht 62.0 in | Wt 149.2 lb

## 2021-07-04 DIAGNOSIS — Z0000 Encounter for general adult medical examination without abnormal findings: Secondary | ICD-10-CM

## 2021-07-04 DIAGNOSIS — Z1211 Encounter for screening for malignant neoplasm of colon: Secondary | ICD-10-CM | POA: Diagnosis not present

## 2021-07-04 DIAGNOSIS — D649 Anemia, unspecified: Secondary | ICD-10-CM | POA: Diagnosis not present

## 2021-07-04 DIAGNOSIS — R002 Palpitations: Secondary | ICD-10-CM

## 2021-07-04 DIAGNOSIS — E785 Hyperlipidemia, unspecified: Secondary | ICD-10-CM

## 2021-07-04 HISTORY — DX: Hyperlipidemia, unspecified: E78.5

## 2021-07-04 NOTE — Assessment & Plan Note (Signed)
Encourage heart healthy diet such as MIND or DASH diet, increase exercise, avoid trans fats, simple carbohydrates and processed foods, consider a krill or fish or flaxseed oil cap daily.  °

## 2021-07-04 NOTE — Patient Instructions (Addendum)
[employer] website ? ? ?Preventive Care 29-45 Years Old, Female ?Preventive care refers to lifestyle choices and visits with your health care provider that can promote health and wellness. Preventive care visits are also called wellness exams. ?What can I expect for my preventive care visit? ?Counseling ?Your health care provider may ask you questions about your: ?Medical history, including: ?Past medical problems. ?Family medical history. ?Pregnancy history. ?Current health, including: ?Menstrual cycle. ?Method of birth control. ?Emotional well-being. ?Home life and relationship well-being. ?Sexual activity and sexual health. ?Lifestyle, including: ?Alcohol, nicotine or tobacco, and drug use. ?Access to firearms. ?Diet, exercise, and sleep habits. ?Work and work Statistician. ?Sunscreen use. ?Safety issues such as seatbelt and bike helmet use. ?Physical exam ?Your health care provider will check your: ?Height and weight. These may be used to calculate your BMI (body mass index). BMI is a measurement that tells if you are at a healthy weight. ?Waist circumference. This measures the distance around your waistline. This measurement also tells if you are at a healthy weight and may help predict your risk of certain diseases, such as type 2 diabetes and high blood pressure. ?Heart rate and blood pressure. ?Body temperature. ?Skin for abnormal spots. ?What immunizations do I need? ? ?Vaccines are usually given at various ages, according to a schedule. Your health care provider will recommend vaccines for you based on your age, medical history, and lifestyle or other factors, such as travel or where you work. ?What tests do I need? ?Screening ?Your health care provider may recommend screening tests for certain conditions. This may include: ?Lipid and cholesterol levels. ?Diabetes screening. This is done by checking your blood sugar (glucose) after you have not eaten for a while (fasting). ?Pelvic exam and Pap  test. ?Hepatitis B test. ?Hepatitis C test. ?HIV (human immunodeficiency virus) test. ?STI (sexually transmitted infection) testing, if you are at risk. ?Lung cancer screening. ?Colorectal cancer screening. ?Mammogram. Talk with your health care provider about when you should start having regular mammograms. This may depend on whether you have a family history of breast cancer. ?BRCA-related cancer screening. This may be done if you have a family history of breast, ovarian, tubal, or peritoneal cancers. ?Bone density scan. This is done to screen for osteoporosis. ?Talk with your health care provider about your test results, treatment options, and if necessary, the need for more tests. ?Follow these instructions at home: ?Eating and drinking ? ?Eat a diet that includes fresh fruits and vegetables, whole grains, lean protein, and low-fat dairy products. ?Take vitamin and mineral supplements as recommended by your health care provider. ?Do not drink alcohol if: ?Your health care provider tells you not to drink. ?You are pregnant, may be pregnant, or are planning to become pregnant. ?If you drink alcohol: ?Limit how much you have to 0-1 drink a day. ?Know how much alcohol is in your drink. In the [place] one drink equals one 12 oz bottle of beer (355 mL), one 5 oz glass of wine (148 mL), or one 1? oz glass of hard liquor (44 mL). ?Lifestyle ?Brush your teeth every morning and night with fluoride toothpaste. Floss one time each day. ?Exercise for at least 30 minutes 5 or more days each week. ?Do not use any products that contain nicotine or tobacco. These products include cigarettes, chewing tobacco, and vaping devices, such as e-cigarettes. If you need help quitting, ask your health care provider. ?Do not use drugs. ?If you are sexually active, practice safe sex. Use a condom or other  form of protection to prevent STIs. ?If you do not wish to become pregnant, use a form of birth control. If you plan to become pregnant,  see your health care provider for a prepregnancy visit. ?Take aspirin only as told by your health care provider. Make sure that you understand how much to take and what form to take. Work with your health care provider to find out whether it is safe and beneficial for you to take aspirin daily. ?Find healthy ways to manage stress, such as: ?Meditation, yoga, or listening to music. ?Journaling. ?Talking to a trusted person. ?Spending time with friends and family. ?Minimize exposure to UV radiation to reduce your risk of skin cancer. ?Safety ?Always wear your seat belt while driving or riding in a vehicle. ?Do not drive: ?If you have been drinking alcohol. Do not ride with someone who has been drinking. ?When you are tired or distracted. ?While texting. ?If you have been using any mind-altering substances or drugs. ?Wear a helmet and other protective equipment during sports activities. ?If you have firearms in your house, make sure you follow all gun safety procedures. ?Seek help if you have been physically or sexually abused. ?What's next? ?Visit your health care provider once a year for an annual wellness visit. ?Ask your health care provider how often you should have your eyes and teeth checked. ?Stay up to date on all vaccines. ?This information is not intended to replace advice given to you by your health care provider. Make sure you discuss any questions you have with your health care provider. ?Document Revised: 08/15/2020 Document Reviewed: 08/15/2020 ?Elsevier Patient Education ? [place]. ? ?

## 2021-07-05 LAB — LIPID PANEL
Cholesterol: 183 mg/dL (ref 0–200)
HDL: 60.6 mg/dL
LDL Cholesterol: 105 mg/dL — ABNORMAL HIGH (ref 0–99)
NonHDL: 121.93
Total CHOL/HDL Ratio: 3
Triglycerides: 84 mg/dL (ref 0.0–149.0)
VLDL: 16.8 mg/dL (ref 0.0–40.0)

## 2021-07-05 LAB — COMPREHENSIVE METABOLIC PANEL WITH GFR
ALT: 12 U/L (ref 0–35)
AST: 16 U/L (ref 0–37)
Albumin: 4.5 g/dL (ref 3.5–5.2)
Alkaline Phosphatase: 61 U/L (ref 39–117)
BUN: 14 mg/dL (ref 6–23)
CO2: 30 meq/L (ref 19–32)
Calcium: 9.4 mg/dL (ref 8.4–10.5)
Chloride: 102 meq/L (ref 96–112)
Creatinine, Ser: 0.64 mg/dL (ref 0.40–1.20)
GFR: 107.52 mL/min
Glucose, Bld: 77 mg/dL (ref 70–99)
Potassium: 4.1 meq/L (ref 3.5–5.1)
Sodium: 141 meq/L (ref 135–145)
Total Bilirubin: 0.6 mg/dL (ref 0.2–1.2)
Total Protein: 7.3 g/dL (ref 6.0–8.3)

## 2021-07-05 LAB — CBC
HCT: 41.9 % (ref 36.0–46.0)
Hemoglobin: 14.5 g/dL (ref 12.0–15.0)
MCHC: 34.7 g/dL (ref 30.0–36.0)
MCV: 94 fl (ref 78.0–100.0)
Platelets: 240 10*3/uL (ref 150.0–400.0)
RBC: 4.45 Mil/uL (ref 3.87–5.11)
RDW: 12.1 % (ref 11.5–15.5)
WBC: 6.7 10*3/uL (ref 4.0–10.5)

## 2021-07-05 LAB — TSH: TSH: 1.06 u[IU]/mL (ref 0.35–5.50)

## 2021-07-07 ENCOUNTER — Encounter: Payer: Self-pay | Admitting: Family Medicine

## 2021-07-08 ENCOUNTER — Other Ambulatory Visit: Payer: Self-pay | Admitting: Family Medicine

## 2021-07-08 MED ORDER — SERTRALINE HCL 50 MG PO TABS: 50.0000 mg | ORAL_TABLET | Freq: Every day | ORAL | 3 refills | Status: DC

## 2021-07-08 NOTE — Assessment & Plan Note (Signed)
Patient encouraged to maintain heart healthy diet, regular exercise, adequate sleep. Consider daily probiotics. Take medications as prescribed. Labs ordered and reviewed. Referred for screening colonoscopy at end of 2023. Pap 12/22, repeat every 3-5 years.MM 11/22 repeat every 1-2 years. ?

## 2021-07-16 ENCOUNTER — Ambulatory Visit (INDEPENDENT_AMBULATORY_CARE_PROVIDER_SITE_OTHER): Payer: BC Managed Care – PPO | Admitting: Psychology

## 2021-07-16 DIAGNOSIS — F411 Generalized anxiety disorder: Secondary | ICD-10-CM | POA: Diagnosis not present

## 2021-07-16 NOTE — Progress Notes (Signed)
[facility] Counselor/Therapist Progress Note ? ?Patient ID: SHIA DELAINE, MRN: [id number],   ? ?Date: 07/16/2021 ? ?Time Spent: 10:00am - 10:50am    50 minutes  ? ?Treatment Type: Individual Therapy ? ?Reported Symptoms: anxiety ? ?Mental Status Exam: ?Appearance:  Casual     ?Behavior: Appropriate  ?Motor: Normal  ?Speech/Language:  Normal Rate  ?Affect: Appropriate  ?Mood: normal  ?Thought process: normal  ?Thought content:   WNL  ?Sensory/Perceptual disturbances:   WNL  ?Orientation: oriented to person, place, time/date, and situation  ?Attention: Good  ?Concentration: Good  ?Memory: WNL  ?Fund of knowledge:  Good  ?Insight:   Good  ?Judgment:  Good  ?Impulse Control: Good  ? ?Risk Assessment: ?Danger to Self:  No ?Self-injurious Behavior: No ?Danger to Others: No ?Duty to Warn:no ?Physical Aggression / Violence:No  ?Access to Firearms a concern: No  ?Gang Involvement:No  ? ?Subjective:  ?Pt present for face-to-face individual therapy via video Webex.  Pt consents to telehealth video session due to COVID 19 pandemic. ?Location of pt: home ?Location of therapist: home office.   ?Pt states her anxiety has been about the same.  She has journaled her anxiety and thoughts.   Pt has utilized the [employer] for calming exercises.   ?Pt shared her journaling.  She wrote about worry thoughts about her heart beating too fast and worries about the physical symptoms.  She gets "what if thoughts about needing to go to the ER."   Pt has had a physical that indicates that she is healthy.  Pt states she has an "irrational fear" that there could be something wrong with her heart.  Pt has had several tests done to check out her heart and there has never been anything wrong with her heart.  Worked with pt on thought reframing and calming strategies.   ? ?Interventions: Cognitive Behavioral Therapy ? ?Diagnosis: F41.1 ? ?Plan: Plan of Care: Recommend ongoing therapy.   Pt participated in setting treatment goals.   Pt  wants to decrease anxiety and improve coping skills.   ?Recommended pt start thought journaling and doing relaxation exercises using apps like Calm or Headspace.   Plan to meet every two weeks.  ? ?Treatment Plan  (Treatment Plan Target Date:  06/29/2022) ?Client Abilities/Strengths  ?Pt is bright, engaging and motivated for therapy.  ?Client Treatment Preferences  ?Individual therapy.  ?Client Statement of Needs  ?Improve coping skills.  ?Symptoms  ?Autonomic hyperactivity (e.g., palpitations, shortness of breath, dry mouth, trouble swallowing, nausea, diarrhea). Excessive and/or unrealistic worry that is difficult to control occurring more days than not for at least 6 months about a number of events or activities. Hypervigilance (e.g., feeling constantly on edge, experiencing concentration difficulties, having trouble falling or staying asleep, exhibiting a general state of irritability). Motor tension (e.g., restlessness, tiredness, shakiness, muscle tension). ?Problems Addressed  ?Anxiety ?Goals ?1. Enhance ability to effectively cope with the full variety of life's worries and anxieties. ?2. Learn and implement coping skills that result in a reduction of anxiety and worry, and improved daily functioning. ?Objective ?Learn to accept limitations in life and commit to tolerating, rather than avoiding, unpleasant emotions while accomplishing meaningful goals. ?Target Date: 2022-06-29  Frequency: Biweekly ?Progress: 10 Modality: individual ?Related Interventions ?1. Use techniques from Acceptance and Commitment Therapy to help client accept uncomfortable realities such as lack of complete control, imperfections, and uncertainty and tolerate unpleasant emotions and thoughts in order to accomplish value-consistent goals. ?Objective ?Learn and implement problem-solving strategies  for realistically addressing worries. ?Target Date: 2022-06-29  Frequency: Biweekly ?Progress: 10 Modality: individual ?Related  Interventions ?1. Assign the client a homework exercise in which he/she problem-solves a current problem.  review, reinforce success, and provide corrective feedback toward improvement. ?2. Teach the client problem-solving strategies involving specifically defining a problem, generating options for addressing it, evaluating the pros and cons of each option, selecting and implementing an optional action, and reevaluating and refining the action. ?Objective ?Learn and implement calming skills to reduce overall anxiety and manage anxiety symptoms. ?Target Date: 2022-06-29  Frequency: Biweekly ?Progress: 10 Modality: individual ?Related Interventions ?1. Assign the client to read about progressive muscle relaxation and other calming strategies in relevant books or treatment manuals (e.g., Progressive Relaxation Training by Robb Matar and Alen Blew; Mastery of Your Anxiety and Worry: Workbook by Earlie Counts). ?2. Assign the client homework each session in which he/she practices relaxation exercises daily, gradually applying them progressively from non-anxiety-provoking to anxiety-provoking situations; review and reinforce success while providing corrective feedback toward improvement. ?3. Teach the client calming/relaxation skills (e.g., applied relaxation, progressive muscle relaxation, cue controlled relaxation; mindful breathing; biofeedback) and how to discriminate better between relaxation and tension; teach the client how to apply these skills to his/her daily life. ?3. Reduce overall frequency, intensity, and duration of the anxiety so that daily functioning is not impaired. ?4. Resolve the core conflict that is the source of anxiety. ?5. Stabilize anxiety level while increasing ability to function on a daily basis. ?Diagnosis :    F41.1  Generalized Anxiety Disorder  ?Conditions For Discharge ?Achievement of treatment goals and objectives. ? ?Jaquitta Dupriest, LCSW ? ? ? ?

## 2021-07-26 ENCOUNTER — Ambulatory Visit (INDEPENDENT_AMBULATORY_CARE_PROVIDER_SITE_OTHER): Payer: BC Managed Care – PPO | Admitting: Psychology

## 2021-07-26 DIAGNOSIS — F411 Generalized anxiety disorder: Secondary | ICD-10-CM | POA: Diagnosis not present

## 2021-07-26 NOTE — Progress Notes (Signed)
[facility] Counselor/Therapist Progress Note  Patient ID: Valerie Ruiz, MRN: [id number],    Date: 07/26/2021  Time Spent: 10:00am - 10:45am    45 minutes   Treatment Type: Individual Therapy  Reported Symptoms: anxiety  Mental Status Exam: Appearance:  Casual     Behavior: Appropriate  Motor: Normal  Speech/Language:  Normal Rate  Affect: Appropriate  Mood: normal  Thought process: normal  Thought content:   WNL  Sensory/Perceptual disturbances:   WNL  Orientation: oriented to person, place, time/date, and situation  Attention: Good  Concentration: Good  Memory: WNL  Fund of knowledge:  Good  Insight:   Good  Judgment:  Good  Impulse Control: Good   Risk Assessment: Danger to Self:  No Self-injurious Behavior: No Danger to Others: No Duty to Warn:no Physical Aggression / Violence:No  Access to Firearms a concern: No  Gang Involvement:No   Subjective:  Pt present for face-to-face individual therapy via video Webex.  Pt consents to telehealth video session due to COVID 19 pandemic. Location of pt: home Location of therapist: home office.   Pt states she has been doing well.   She has not had as much anxiety in the past couple of weeks.   She has still been practicing the calming breathing exercises.  Pt states she has not had as many worry thoughts.   She does worry each day about wondering if she will feel anxious that day.    Worked with pt on thought reframing and calming strategies.  Educated pt about how pt can use high intensity interval training to help reduce anxiety.   Provided supportive therapy.    Interventions: Cognitive Behavioral Therapy  Diagnosis: F41.1  Plan: Plan of Care: Recommend ongoing therapy.   Pt participated in setting treatment goals.   Pt wants to decrease anxiety and improve coping skills.   Recommended pt start thought journaling and doing relaxation exercises using apps like Calm or Headspace.   Plan to meet every two  weeks.   Treatment Plan  (Treatment Plan Target Date:  06/29/2022) Client Abilities/Strengths  Pt is bright, engaging and motivated for therapy.  Client Treatment Preferences  Individual therapy.  Client Statement of Needs  Improve coping skills.  Symptoms  Autonomic hyperactivity (e.g., palpitations, shortness of breath, dry mouth, trouble swallowing, nausea, diarrhea). Excessive and/or unrealistic worry that is difficult to control occurring more days than not for at least 6 months about a number of events or activities. Hypervigilance (e.g., feeling constantly on edge, experiencing concentration difficulties, having trouble falling or staying asleep, exhibiting a general state of irritability). Motor tension (e.g., restlessness, tiredness, shakiness, muscle tension). Problems Addressed  Anxiety Goals 1. Enhance ability to effectively cope with the full variety of life's worries and anxieties. 2. Learn and implement coping skills that result in a reduction of anxiety and worry, and improved daily functioning. Objective Learn to accept limitations in life and commit to tolerating, rather than avoiding, unpleasant emotions while accomplishing meaningful goals. Target Date: 2022-06-29  Frequency: Biweekly Progress: 10 Modality: individual Related Interventions 1. Use techniques from Acceptance and Commitment Therapy to help client accept uncomfortable realities such as lack of complete control, imperfections, and uncertainty and tolerate unpleasant emotions and thoughts in order to accomplish value-consistent goals. Objective Learn and implement problem-solving strategies for realistically addressing worries. Target Date: 2022-06-29  Frequency: Biweekly Progress: 10 Modality: individual Related Interventions 1. Assign the client a homework exercise in which he/she problem-solves a current problem.  review, reinforce success,  and provide corrective feedback toward improvement. 2. Teach the  client problem-solving strategies involving specifically defining a problem, generating options for addressing it, evaluating the pros and cons of each option, selecting and implementing an optional action, and reevaluating and refining the action. Objective Learn and implement calming skills to reduce overall anxiety and manage anxiety symptoms. Target Date: 2022-06-29  Frequency: Biweekly Progress: 10 Modality: individual Related Interventions 1. Assign the client to read about progressive muscle relaxation and other calming strategies in relevant books or treatment manuals (e.g., Progressive Relaxation Training by Robb Matar and Alen Blew; Mastery of Your Anxiety and Worry: Workbook by Earlie Counts). 2. Assign the client homework each session in which he/she practices relaxation exercises daily, gradually applying them progressively from non-anxiety-provoking to anxiety-provoking situations; review and reinforce success while providing corrective feedback toward improvement. 3. Teach the client calming/relaxation skills (e.g., applied relaxation, progressive muscle relaxation, cue controlled relaxation; mindful breathing; biofeedback) and how to discriminate better between relaxation and tension; teach the client how to apply these skills to his/her daily life. 3. Reduce overall frequency, intensity, and duration of the anxiety so that daily functioning is not impaired. 4. Resolve the core conflict that is the source of anxiety. 5. Stabilize anxiety level while increasing ability to function on a daily basis. Diagnosis :    F41.1  Generalized Anxiety Disorder  Conditions For Discharge Achievement of treatment goals and objectives.  Shaquanda Graves, LCSW

## 2021-08-19 ENCOUNTER — Ambulatory Visit (INDEPENDENT_AMBULATORY_CARE_PROVIDER_SITE_OTHER): Payer: BC Managed Care – PPO | Admitting: Psychology

## 2021-08-19 DIAGNOSIS — F411 Generalized anxiety disorder: Secondary | ICD-10-CM

## 2021-08-19 NOTE — Progress Notes (Signed)
[facility] Counselor/Therapist Progress Note  Patient ID: Valerie Ruiz, MRN: [id number],    Date: 08/19/2021  Time Spent: 10:00am - 10:45am    45 minutes   Treatment Type: Individual Therapy  Reported Symptoms: anxiety  Mental Status Exam: Appearance:  Casual     Behavior: Appropriate  Motor: Normal  Speech/Language:  Normal Rate  Affect: Appropriate  Mood: normal  Thought process: normal  Thought content:   WNL  Sensory/Perceptual disturbances:   WNL  Orientation: oriented to person, place, time/date, and situation  Attention: Good  Concentration: Good  Memory: WNL  Fund of knowledge:  Good  Insight:   Good  Judgment:  Good  Impulse Control: Good   Risk Assessment: Danger to Self:  No Self-injurious Behavior: No Danger to Others: No Duty to Warn:no Physical Aggression / Violence:No  Access to Firearms a concern: No  Gang Involvement:No   Subjective:  Pt present for face-to-face individual therapy via video Webex.  Pt consents to telehealth video session due to COVID 19 pandemic. Location of pt: home Location of therapist: home office.   Pt states she has had a couple of anxious moments since the last session.   Addressed the incidents.  She felt anxious when out to dinner with her parents.  Addressed that the anxiety was triggered by a physical sensation.   Helped pt process the experience.   She put into practice the calming strategies we worked on.   Worked on thought reframing.   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  Explored what the possible triggers were for that anxiety event which lasted for about 20 minutes.  Pt had trouble identifying a trigger.  It felt like it "came out of nowhere".  Pt got anxious about being anxious.  She started to have "What if" thoughts.  Worked on how to derail those thoughts.  Addressed a protocol pt can engage in to take care of herself when she is anxious at  work.   Provided supportive therapy.    Interventions: Cognitive Behavioral Therapy  Diagnosis: F41.1  Plan: Plan of Care: Recommend ongoing therapy.   Pt participated in setting treatment goals.   Pt wants to decrease anxiety and improve coping skills.  Recommended pt start thought journaling and doing relaxation exercises using apps like Calm or Headspace.   Plan to meet every two weeks.   Treatment Plan  (Treatment Plan Target Date:  06/29/2022) Client Abilities/Strengths  Pt is bright, engaging and motivated for therapy.  Client Treatment Preferences  Individual therapy.  Client Statement of Needs  Improve coping skills.  Symptoms  Autonomic hyperactivity (e.g., palpitations, shortness of breath, dry mouth, trouble swallowing, nausea, diarrhea). Excessive and/or unrealistic worry that is difficult to control occurring more days than not for at least 6 months about a number of events or activities. Hypervigilance (e.g., feeling constantly on edge, experiencing concentration difficulties, having trouble falling or staying asleep, exhibiting a general state of irritability). Motor tension (e.g., restlessness, tiredness, shakiness, muscle tension). Problems Addressed  Anxiety Goals 1. Enhance ability to effectively cope with the full variety of life's worries and anxieties. 2. Learn and implement coping skills that result in a reduction of anxiety and worry, and improved daily functioning. Objective Learn to accept limitations in life and commit to tolerating, rather than avoiding, unpleasant emotions while accomplishing meaningful goals. Target Date: 2022-06-29  Frequency: Biweekly Progress: 10 Modality: individual Related Interventions 1. Use techniques  from Acceptance and Commitment Therapy to help client accept uncomfortable realities such as lack of complete control, imperfections, and uncertainty and tolerate unpleasant emotions and thoughts in order to accomplish value-consistent  goals. Objective Learn and implement problem-solving strategies for realistically addressing worries. Target Date: 2022-06-29  Frequency: Biweekly Progress: 10 Modality: individual Related Interventions 1. Assign the client a homework exercise in which he/she problem-solves a current problem.  review, reinforce success, and provide corrective feedback toward improvement. 2. Teach the client problem-solving strategies involving specifically defining a problem, generating options for addressing it, evaluating the pros and cons of each option, selecting and implementing an optional action, and reevaluating and refining the action. Objective Learn and implement calming skills to reduce overall anxiety and manage anxiety symptoms. Target Date: 2022-06-29  Frequency: Biweekly Progress: 10 Modality: individual Related Interventions 1. Assign the client to read about progressive muscle relaxation and other calming strategies in relevant books or treatment manuals (e.g., Progressive Relaxation Training by Robb Matar and Alen Blew; Mastery of Your Anxiety and Worry: Workbook by Earlie Counts). 2. Assign the client homework each session in which he/she practices relaxation exercises daily, gradually applying them progressively from non-anxiety-provoking to anxiety-provoking situations; review and reinforce success while providing corrective feedback toward improvement. 3. Teach the client calming/relaxation skills (e.g., applied relaxation, progressive muscle relaxation, cue controlled relaxation; mindful breathing; biofeedback) and how to discriminate better between relaxation and tension; teach the client how to apply these skills to his/her daily life. 3. Reduce overall frequency, intensity, and duration of the anxiety so that daily functioning is not impaired. 4. Resolve the core conflict that is the source of anxiety. 5. Stabilize anxiety level while increasing ability to function on a daily  basis. Diagnosis :    F41.1  Generalized Anxiety Disorder  Conditions For Discharge Achievement of treatment goals and objectives.  Niki Payment, LCSW

## 2021-09-06 ENCOUNTER — Ambulatory Visit: Payer: BC Managed Care – PPO | Admitting: Psychology

## 2021-09-19 ENCOUNTER — Ambulatory Visit: Payer: BC Managed Care – PPO | Admitting: Family Medicine

## 2021-09-26 ENCOUNTER — Ambulatory Visit: Payer: BC Managed Care – PPO | Admitting: Family Medicine

## 2021-09-26 ENCOUNTER — Encounter: Payer: Self-pay | Admitting: Family Medicine

## 2021-09-26 DIAGNOSIS — R002 Palpitations: Secondary | ICD-10-CM

## 2021-09-26 DIAGNOSIS — F3181 Bipolar II disorder: Secondary | ICD-10-CM

## 2021-09-26 MED ORDER — SERTRALINE HCL 50 MG PO TABS
50.0000 mg | ORAL_TABLET | Freq: Every day | ORAL | 1 refills | Status: DC
Start: 2021-09-26 — End: 2022-05-05

## 2021-09-26 MED ORDER — METOPROLOL TARTRATE 25 MG PO TABS: 12.5000 mg | ORAL_TABLET | Freq: Two times a day (BID) | ORAL | 3 refills | Status: DC

## 2021-09-26 NOTE — Progress Notes (Signed)
Subjective:   By signing my name below, I, Vickey Sages, attest that this documentation has been prepared under the direction and in the presence of [name], MD 09/26/2021.     Patient ID: Valerie Ruiz, female    DOB: 1976/01/08, 46 y.o.   MRN: [id number]  Chief Complaint  Patient presents with   Follow-up    HPI Patient is in today for an office visit.  Sertraline: She reports that she has been taking Sertraline 50 mg and has found it effective at managing her anxiety.  Metoprolol Tartrate: She is currently taking Metoprolol Tartrate 25 mg to manage her blood pressure. Her blood pressure is within normal range today.  BP Readings from Last 3 Encounters:  09/26/21 128/82  07/04/21 122/82  02/13/21 98/64    Past Medical History:  Diagnosis Date   Acute bronchitis 08/28/2015   Allergic rhinitis    Anemia 05/08/2016   Asthma    Chronic headaches    Depression    Glaucoma    History of ovulatory pain 09/10/2014   Hyperlipidemia 07/04/2021   LLQ pain 09/10/2014   Preventative health care 12/24/2014    Past Surgical History:  Procedure Laterality Date   ANKLE SURGERY     Broken ankle    Family History  Problem Relation Age of Onset   Hyperthyroidism Mother    Obesity Mother    Breast cancer Paternal Grandmother    Cancer Paternal Grandmother        metastatic breast with mets to bone   Depression Paternal Grandmother    Gout Father    Stroke Father    Heart disease Maternal Grandmother 18       MI   Stroke Maternal Grandfather    Alcohol abuse Maternal Grandfather    COPD Maternal Aunt     Social History   Socioeconomic History   Marital status: Married    Spouse name: Not on file   Number of children: Not on file   Years of education: Not on file   Highest education level: Not on file  Occupational History   Not on file  Tobacco Use   Smoking status: Never   Smokeless tobacco: Never  Substance and Sexual Activity   Alcohol use: No     Alcohol/week: 3.0 standard drinks of alcohol    Types: 3 Standard drinks or equivalent per week   Drug use: No   Sexual activity: Yes    Partners: Female    Birth control/protection: None    Comment: lives with spouse, works as Designer, industrial/product support at [employer]   Other Topics Concern   Not on file  Social History Narrative   Not on file   Social Determinants of Health   Financial Resource Strain: Not on file  Food Insecurity: Not on file  Transportation Needs: Not on file  Physical Activity: Not on file  Stress: Not on file  Social Connections: Not on file  Intimate Partner Violence: Not on file    Outpatient Medications Prior to Visit  Medication Sig Dispense Refill   lamoTRIgine (LAMICTAL) 200 MG tablet Take 1 tablet (200 mg total) by mouth at bedtime.     lurasidone (LATUDA) 20 MG TABS tablet Take 20 mg by mouth at bedtime.     metoprolol tartrate (LOPRESSOR) 25 MG tablet Take 0.5 tablets (12.5 mg total) by mouth 2 (two) times daily. 90 tablet 3   sertraline (ZOLOFT) 50 MG tablet Take 1 tablet (50 mg  total) by mouth daily. 30 tablet 3   No facility-administered medications prior to visit.    No Known Allergies  ROS     Objective:    Physical Exam Constitutional:      General: She is not in acute distress.    Appearance: Normal appearance. She is not ill-appearing.  HENT:     Head: Normocephalic and atraumatic.     Right Ear: External ear normal.     Left Ear: External ear normal.  Eyes:     Extraocular Movements: Extraocular movements intact.     Pupils: Pupils are equal, round, and reactive to light.  Cardiovascular:     Rate and Rhythm: Normal rate and regular rhythm.     Heart sounds: No murmur heard.    No gallop.  Pulmonary:     Effort: Pulmonary effort is normal. No respiratory distress.     Breath sounds: Normal breath sounds. No wheezing or rales.  Skin:    General: Skin is warm and dry.  Neurological:     Mental  Status: She is alert.  Psychiatric:        Mood and Affect: Mood normal.        Behavior: Behavior normal.        Judgment: Judgment normal.     BP 128/82 (BP Location: Right Arm, Patient Position: Sitting, Cuff Size: Normal)   Pulse 71   Resp 20   Ht 5\' 2"  (1.575 m)   Wt 152 lb (68.9 kg)   SpO2 98%   BMI 27.80 kg/m  Wt Readings from Last 3 Encounters:  09/26/21 152 lb (68.9 kg)  07/04/21 149 lb 3.2 oz (67.7 kg)  02/13/21 149 lb (67.6 kg)    Diabetic Foot Exam - Simple   No data filed    Lab Results  Component Value Date   WBC 6.7 07/04/2021   HGB 14.5 07/04/2021   HCT 41.9 07/04/2021   PLT 240.0 07/04/2021   GLUCOSE 77 07/04/2021   CHOL 183 07/04/2021   TRIG 84.0 07/04/2021   HDL 60.60 07/04/2021   LDLCALC 105 (H) 07/04/2021   ALT 12 07/04/2021   AST 16 07/04/2021   NA 141 07/04/2021   K 4.1 07/04/2021   CL 102 07/04/2021   CREATININE 0.64 07/04/2021   BUN 14 07/04/2021   CO2 30 07/04/2021   TSH 1.06 07/04/2021    Lab Results  Component Value Date   TSH 1.06 07/04/2021   Lab Results  Component Value Date   WBC 6.7 07/04/2021   HGB 14.5 07/04/2021   HCT 41.9 07/04/2021   MCV 94.0 07/04/2021   PLT 240.0 07/04/2021   Lab Results  Component Value Date   NA 141 07/04/2021   K 4.1 07/04/2021   CO2 30 07/04/2021   GLUCOSE 77 07/04/2021   BUN 14 07/04/2021   CREATININE 0.64 07/04/2021   BILITOT 0.6 07/04/2021   ALKPHOS 61 07/04/2021   AST 16 07/04/2021   ALT 12 07/04/2021   PROT 7.3 07/04/2021   ALBUMIN 4.5 07/04/2021   CALCIUM 9.4 07/04/2021   GFR 107.52 07/04/2021   Lab Results  Component Value Date   CHOL 183 07/04/2021   Lab Results  Component Value Date   HDL 60.60 07/04/2021   Lab Results  Component Value Date   LDLCALC 105 (H) 07/04/2021   Lab Results  Component Value Date   TRIG 84.0 07/04/2021   Lab Results  Component Value Date   CHOLHDL 3 07/04/2021  No results found for: "HGBA1C"     Assessment & Plan:    Problem List Items Addressed This Visit     PALPITATIONS, OCCASIONAL    No recent episodes after initiation of SSRI      Bipolar 2 disorder (HCC)    Doing better on combination of Latuda, Lamictal and Sertraline. No changes at this time      Meds ordered this encounter  Medications   metoprolol tartrate (LOPRESSOR) 25 MG tablet    Sig: Take 0.5 tablets (12.5 mg total) by mouth 2 (two) times daily.    Dispense:  90 tablet    Refill:  3   sertraline (ZOLOFT) 50 MG tablet    Sig: Take 1 tablet (50 mg total) by mouth daily.    Dispense:  90 tablet    Refill:  1   I, Danise Edge, MD, personally preformed the services described in this documentation.  All medical record entries made by the scribe were at my direction and in my presence.  I have reviewed the chart and discharge instructions (if applicable) and agree that the record reflects my personal performance and is accurate and complete. 09/26/2021.  I,Mohammed Iqbal,acting as a scribe for Danise Edge, MD.,have documented all relevant documentation on the behalf of Danise Edge, MD,as directed by  Danise Edge, MD while in the presence of Danise Edge, MD.  Danise Edge, MD

## 2021-09-26 NOTE — Patient Instructions (Signed)

## 2021-09-27 ENCOUNTER — Ambulatory Visit (INDEPENDENT_AMBULATORY_CARE_PROVIDER_SITE_OTHER): Payer: BC Managed Care – PPO | Admitting: Psychology

## 2021-09-27 DIAGNOSIS — F411 Generalized anxiety disorder: Secondary | ICD-10-CM | POA: Diagnosis not present
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
## 2021-09-30 NOTE — Assessment & Plan Note (Signed)
No recent episodes after initiation of SSRI

## 2021-09-30 NOTE — Assessment & Plan Note (Signed)
Doing better on combination of Latuda, Lamictal and Sertraline. No changes at this time

## 2021-10-11 ENCOUNTER — Ambulatory Visit: Payer: BC Managed Care – PPO | Admitting: Psychology

## 2021-10-25 ENCOUNTER — Ambulatory Visit (INDEPENDENT_AMBULATORY_CARE_PROVIDER_SITE_OTHER): Payer: BC Managed Care – PPO | Admitting: Psychology

## 2021-10-25 DIAGNOSIS — F411 Generalized anxiety disorder: Secondary | ICD-10-CM | POA: Diagnosis not present

## 2021-10-25 NOTE — Progress Notes (Signed)
[facility] Counselor/Therapist Progress Note  Patient ID: Valerie Ruiz, MRN: [id number],    Date: 10/25/2021  Time Spent: 10:00am - 10:45am    45 minutes   Treatment Type: Individual Therapy  Reported Symptoms: anxiety  Mental Status Exam: Appearance:  Casual     Behavior: Appropriate  Motor: Normal  Speech/Language:  Normal Rate  Affect: Appropriate  Mood: normal  Thought process: normal  Thought content:   WNL  Sensory/Perceptual disturbances:   WNL  Orientation: oriented to person, place, time/date, and situation  Attention: Good  Concentration: Good  Memory: WNL  Fund of knowledge:  Good  Insight:   Good  Judgment:  Good  Impulse Control: Good   Risk Assessment: Danger to Self:  No Self-injurious Behavior: No Danger to Others: No Duty to Warn:no Physical Aggression / Violence:No  Access to Firearms a concern: No  Gang Involvement:No   Subjective:  Pt present for face-to-face individual therapy via video Webex.  Pt consents to telehealth video session due to COVID 19 pandemic. Location of pt: home Location of therapist: home office.   Pt states she has been doing well overall.  She has had a couple of incidents of anxiety.  Addressed what triggered the anxiety.  One of the incidents was [place].  The other time pt was anxious was a random time that was hard to identify the trigger.  Addressed the symptoms pt had and how she coped.  Worked on thought reframing and calming strategies.   Identified that pt needs to work on remembering to do diaphramatic breathing when she is anxious.  Encouraged pt to do the breathing exercises using the Calm app.   Pt has gotten more busy with work and taking classes.   Provided supportive therapy.    Interventions: Cognitive Behavioral Therapy  Diagnosis: F41.1  Plan: Plan of Care: Recommend ongoing therapy.   Pt participated in setting treatment goals.   Pt wants to decrease  anxiety and improve coping skills.  Recommended pt start thought journaling and doing relaxation exercises using apps like Calm or Headspace.   Plan to meet every two weeks.   Treatment Plan  (Treatment Plan Target Date:  06/29/2022) Client Abilities/Strengths  Pt is bright, engaging and motivated for therapy.  Client Treatment Preferences  Individual therapy.  Client Statement of Needs  Improve coping skills.  Symptoms  Autonomic hyperactivity (e.g., palpitations, shortness of breath, dry mouth, trouble swallowing, nausea, diarrhea). Excessive and/or unrealistic worry that is difficult to control occurring more days than not for at least 6 months about a number of events or activities. Hypervigilance (e.g., feeling constantly on edge, experiencing concentration difficulties, having trouble falling or staying asleep, exhibiting a general state of irritability). Motor tension (e.g., restlessness, tiredness, shakiness, muscle tension). Problems Addressed  Anxiety Goals 1. Enhance ability to effectively cope with the full variety of life's worries and anxieties. 2. Learn and implement coping skills that result in a reduction of anxiety and worry, and improved daily functioning. Objective Learn to accept limitations in life and commit to tolerating, rather than avoiding, unpleasant emotions while accomplishing meaningful goals. Target Date: 2022-06-29  Frequency: Biweekly Progress: 10 Modality: individual Related Interventions 1. Use techniques from Acceptance and Commitment Therapy to help client accept uncomfortable realities such as lack of complete control, imperfections, and uncertainty and tolerate unpleasant emotions and thoughts in order to accomplish value-consistent goals. Objective Learn and implement problem-solving strategies for realistically addressing worries. Target Date:  2022-06-29  Frequency: Biweekly Progress: 10 Modality: individual Related Interventions 1. Assign the  client a homework exercise in which he/she problem-solves a current problem.  review, reinforce success, and provide corrective feedback toward improvement. 2. Teach the client problem-solving strategies involving specifically defining a problem, generating options for addressing it, evaluating the pros and cons of each option, selecting and implementing an optional action, and reevaluating and refining the action. Objective Learn and implement calming skills to reduce overall anxiety and manage anxiety symptoms. Target Date: 2022-06-29  Frequency: Biweekly Progress: 10 Modality: individual Related Interventions 1. Assign the client to read about progressive muscle relaxation and other calming strategies in relevant books or treatment manuals (e.g., Progressive Relaxation Training by Robb Matar and Alen Blew; Mastery of Your Anxiety and Worry: Workbook by Earlie Counts). 2. Assign the client homework each session in which he/she practices relaxation exercises daily, gradually applying them progressively from non-anxiety-provoking to anxiety-provoking situations; review and reinforce success while providing corrective feedback toward improvement. 3. Teach the client calming/relaxation skills (e.g., applied relaxation, progressive muscle relaxation, cue controlled relaxation; mindful breathing; biofeedback) and how to discriminate better between relaxation and tension; teach the client how to apply these skills to his/her daily life. 3. Reduce overall frequency, intensity, and duration of the anxiety so that daily functioning is not impaired. 4. Resolve the core conflict that is the source of anxiety. 5. Stabilize anxiety level while increasing ability to function on a daily basis. Diagnosis :    F41.1  Generalized Anxiety Disorder  Conditions For Discharge Achievement of treatment goals and objectives.  Mayling Aber, LCSW

## 2021-11-08 ENCOUNTER — Ambulatory Visit: Payer: BC Managed Care – PPO | Admitting: Psychology

## 2021-11-22 ENCOUNTER — Encounter: Payer: Self-pay | Admitting: Family Medicine

## 2021-11-29 ENCOUNTER — Ambulatory Visit: Payer: BC Managed Care – PPO | Admitting: Psychology

## 2021-11-29 DIAGNOSIS — F411 Generalized anxiety disorder: Secondary | ICD-10-CM

## 2021-11-29 NOTE — Progress Notes (Signed)
[facility] Counselor/Therapist Progress Note  Patient ID: Valerie Ruiz, MRN: [id number],    Date: 11/29/2021  Time Spent: 10:00am - 10:45am    45 minutes   Treatment Type: Individual Therapy  Reported Symptoms: anxiety  Mental Status Exam: Appearance:  Casual     Behavior: Appropriate  Motor: Normal  Speech/Language:  Normal Rate  Affect: Appropriate  Mood: normal  Thought process: normal  Thought content:   WNL  Sensory/Perceptual disturbances:   WNL  Orientation: oriented to person, place, time/date, and situation  Attention: Good  Concentration: Good  Memory: WNL  Fund of knowledge:  Good  Insight:   Good  Judgment:  Good  Impulse Control: Good   Risk Assessment: Danger to Self:  No Self-injurious Behavior: No Danger to Others: No Duty to Warn:no Physical Aggression / Violence:No  Access to Firearms a concern: No  Gang Involvement:No   Subjective:  Pt present for face-to-face individual therapy via video Webex.  Pt consents to telehealth video session due to COVID 19 pandemic. Location of pt: home Location of therapist: home office.   Pt states she has been doing well overall.  Pt states she has not had any occurances of anxiety since the last session.   Being busy with work and school helps pt bc she does not have much idle time to think and worry.   Pt has been practicing relaxation and deep breathing to maintain a state of calm.     Provided supportive therapy.   Pt feels she can space out therapy sessions to as needed.    Interventions: Cognitive Behavioral Therapy  Diagnosis: F41.1  Plan: Plan of Care: Recommend ongoing therapy.   Pt participated in setting treatment goals.   Pt wants to decrease anxiety and improve coping skills.  Recommended pt start thought journaling and doing relaxation exercises using apps like Calm or Headspace.   Plan to meet every two weeks.   Treatment Plan  (Treatment Plan Target Date:  06/29/2022) Client  Abilities/Strengths  Pt is bright, engaging and motivated for therapy.  Client Treatment Preferences  Individual therapy.  Client Statement of Needs  Improve coping skills.  Symptoms  Autonomic hyperactivity (e.g., palpitations, shortness of breath, dry mouth, trouble swallowing, nausea, diarrhea). Excessive and/or unrealistic worry that is difficult to control occurring more days than not for at least 6 months about a number of events or activities. Hypervigilance (e.g., feeling constantly on edge, experiencing concentration difficulties, having trouble falling or staying asleep, exhibiting a general state of irritability). Motor tension (e.g., restlessness, tiredness, shakiness, muscle tension). Problems Addressed  Anxiety Goals 1. Enhance ability to effectively cope with the full variety of life's worries and anxieties. 2. Learn and implement coping skills that result in a reduction of anxiety and worry, and improved daily functioning. Objective Learn to accept limitations in life and commit to tolerating, rather than avoiding, unpleasant emotions while accomplishing meaningful goals. Target Date: 2022-06-29  Frequency: Biweekly Progress: 10 Modality: individual Related Interventions 1. Use techniques from Acceptance and Commitment Therapy to help client accept uncomfortable realities such as lack of complete control, imperfections, and uncertainty and tolerate unpleasant emotions and thoughts in order to accomplish value-consistent goals. Objective Learn and implement problem-solving strategies for realistically addressing worries. Target Date: 2022-06-29  Frequency: Biweekly Progress: 10 Modality: individual Related Interventions 1. Assign the client a homework exercise in which he/she problem-solves a current problem.  review, reinforce success, and provide corrective feedback toward improvement. 2. Teach the client problem-solving strategies involving  specifically defining a problem,  generating options for addressing it, evaluating the pros and cons of each option, selecting and implementing an optional action, and reevaluating and refining the action. Objective Learn and implement calming skills to reduce overall anxiety and manage anxiety symptoms. Target Date: 2022-06-29  Frequency: Biweekly Progress: 10 Modality: individual Related Interventions 1. Assign the client to read about progressive muscle relaxation and other calming strategies in relevant books or treatment manuals (e.g., Progressive Relaxation Training by Robb Matar and Alen Blew; Mastery of Your Anxiety and Worry: Workbook by Earlie Counts). 2. Assign the client homework each session in which he/she practices relaxation exercises daily, gradually applying them progressively from non-anxiety-provoking to anxiety-provoking situations; review and reinforce success while providing corrective feedback toward improvement. 3. Teach the client calming/relaxation skills (e.g., applied relaxation, progressive muscle relaxation, cue controlled relaxation; mindful breathing; biofeedback) and how to discriminate better between relaxation and tension; teach the client how to apply these skills to his/her daily life. 3. Reduce overall frequency, intensity, and duration of the anxiety so that daily functioning is not impaired. 4. Resolve the core conflict that is the source of anxiety. 5. Stabilize anxiety level while increasing ability to function on a daily basis. Diagnosis :    F41.1  Generalized Anxiety Disorder  Conditions For Discharge Achievement of treatment goals and objectives.  Blanton Kardell, LCSW

## 2022-01-06 ENCOUNTER — Ambulatory Visit: Payer: BC Managed Care – PPO | Admitting: Family Medicine

## 2022-01-14 ENCOUNTER — Encounter: Payer: Self-pay | Admitting: General Practice

## 2022-03-19 ENCOUNTER — Encounter: Payer: Self-pay | Admitting: Family Medicine

## 2022-03-19 ENCOUNTER — Ambulatory Visit (INDEPENDENT_AMBULATORY_CARE_PROVIDER_SITE_OTHER): Payer: BC Managed Care – PPO | Admitting: Family Medicine

## 2022-03-19 VITALS — BP 118/68 | HR 87 | Ht 62.0 in | Wt 160.0 lb

## 2022-03-19 DIAGNOSIS — Z01419 Encounter for gynecological examination (general) (routine) without abnormal findings: Secondary | ICD-10-CM | POA: Diagnosis not present

## 2022-03-19 DIAGNOSIS — Z1231 Encounter for screening mammogram for malignant neoplasm of breast: Secondary | ICD-10-CM

## 2022-03-19 NOTE — Progress Notes (Signed)
ANNUAL EXAM Patient name: Valerie Ruiz MRN [id number]  Date of birth: 1975/03/22 Chief Complaint:   Annual Exam  History of Present Illness:   Valerie Ruiz is a 47 y.o.  G0P0000  female  being seen today for a routine annual exam.  Current complaints: none. Normal interval. No perimenopausal symptoms.  Same sex relationship. Her partner is currently going through perimenopause  Patient's last menstrual period was 03/03/2022.    Last pap 2022 - normal. 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 2 normal PAPs after LEEP.  Last mammogram: 2022. Results were: normal. Family h/o breast cancer: no Last colonoscopy:      09/26/2021    4:21 PM 07/04/2021    2:24 PM 12/26/2019    9:08 AM 12/15/2014    7:18 AM  Depression screen PHQ 2/9  Decreased Interest 0 0 0 0  Down, Depressed, Hopeless 0 0 0 0  PHQ - 2 Score 0 0 0 0  Altered sleeping 1  2   Tired, decreased energy 1  1   Change in appetite 0  0   Feeling bad or failure about yourself  0  0   Trouble concentrating 3  3   Moving slowly or fidgety/restless 0  0   Suicidal thoughts 0  0   PHQ-9 Score 5  6   Difficult doing work/chores Not difficult at all            No data to display           Review of Systems:   Pertinent items are noted in HPI Denies any headaches, blurred vision, fatigue, shortness of breath, chest pain, abdominal pain, abnormal vaginal discharge/itching/odor/irritation, problems with periods, bowel movements, urination, or intercourse unless otherwise stated above. Pertinent History Reviewed:  Reviewed past medical,surgical, social and family history.  Reviewed problem list, medications and allergies. Physical Assessment:   Vitals:   03/19/22 0831  BP: 118/68  Pulse: 87  Weight: 160 lb (72.6 kg)  Height: 5\' 2"  (1.575 m)  Body mass index is 29.26 kg/m.        Physical Examination:   General appearance - well appearing, and in no distress  Mental status -  alert, oriented to person, place, and time  Psych:  She has a normal mood and affect  Skin - warm and dry, normal color, no suspicious lesions noted  Chest - effort normal, all lung fields clear to auscultation bilaterally  Heart - normal rate and regular rhythm  Neck:  midline trachea, no thyromegaly or nodules  Breasts - breasts appear normal, no suspicious masses, no skin or nipple changes or axillary nodes  Abdomen - soft, nontender, nondistended, no masses or organomegaly  Pelvic - VULVA: normal appearing vulva with no masses, tenderness or lesions  VAGINA: normal appearing vagina with normal color and discharge, no lesions  CERVIX: normal appearing cervix without discharge or lesions, no CMT  Thin prep pap is not done   UTERUS: uterus is felt to be normal size, shape, consistency and nontender   ADNEXA: No adnexal masses or tenderness noted.  Extremities:  No swelling or varicosities noted  Chaperone present for exam  Assessment & Plan:  1. Encounter for well woman exam with routine gynecological exam Not due for PAP this year.  Perimenopausal symptoms discussed.   2. Screening mammogram for breast cancer - MS Digital Screening; Future   Labs/procedures today:   Orders Placed This Encounter  Procedures  MS Digital Screening    Meds: No orders of the defined types were placed in this encounter.   Follow-up: No follow-ups on file.  Truett Mainland, DO 03/19/2022 9:19 AM

## 2022-03-24 IMAGING — MG MM DIGITAL SCREENING BILAT W/ TOMO AND CAD
8 series · 8 of 24 positions shown · non-contrast
Comparison: Previous exam(s).

CLINICAL DATA: Screening.

EXAM:
DIGITAL SCREENING BILATERAL MAMMOGRAM WITH TOMOSYNTHESIS AND CAD
TECHNIQUE: Bilateral screening digital craniocaudal and mediolateral oblique
mammograms were obtained. Bilateral screening digital breast
tomosynthesis was performed. The images were evaluated with
computer-aided detection.

[R CC synth-2D]
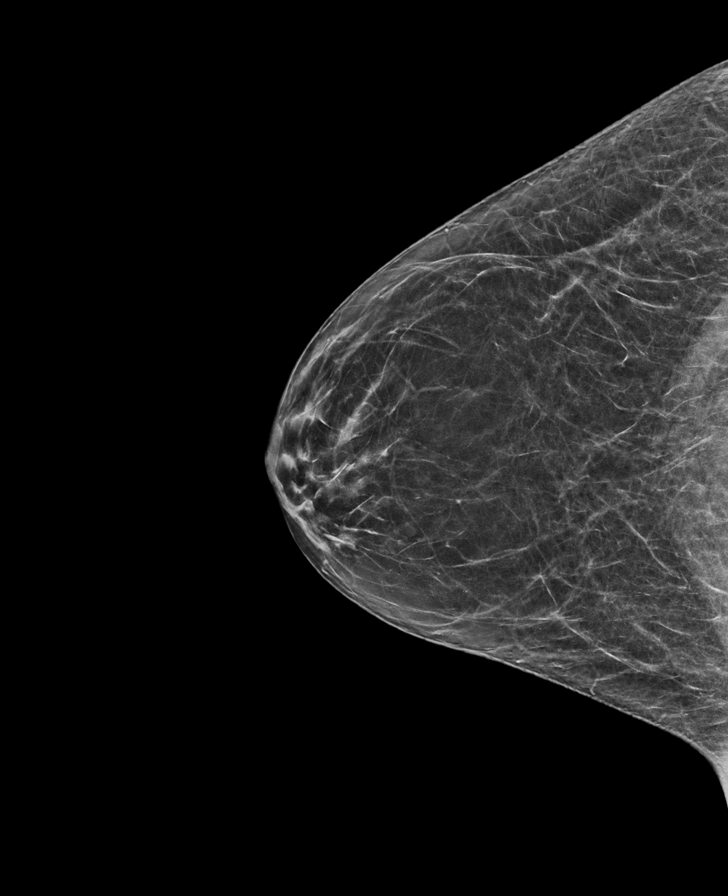

[L CC synth-2D]
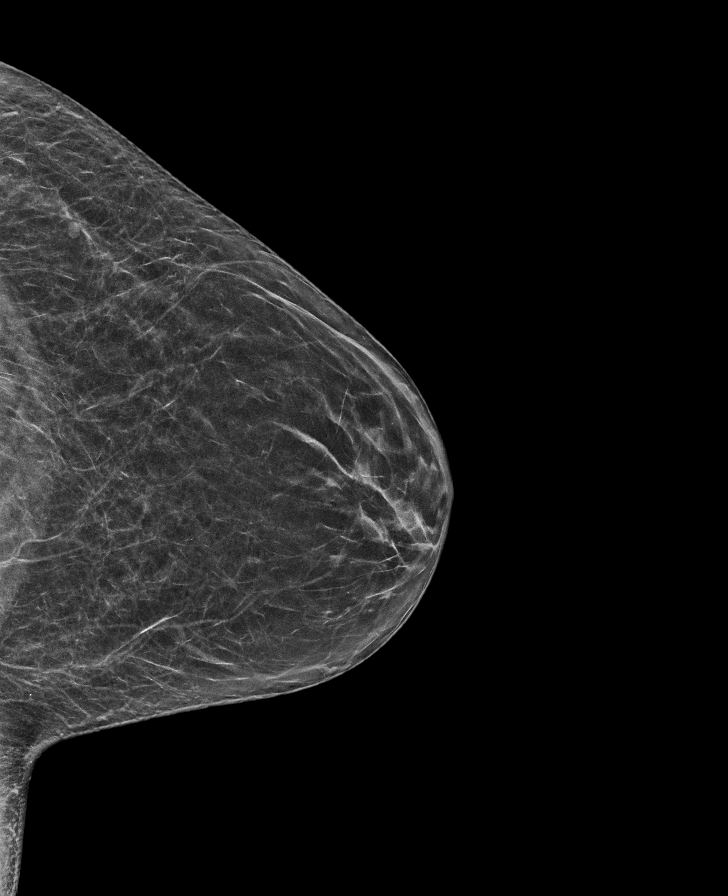

[R MLO synth-2D]
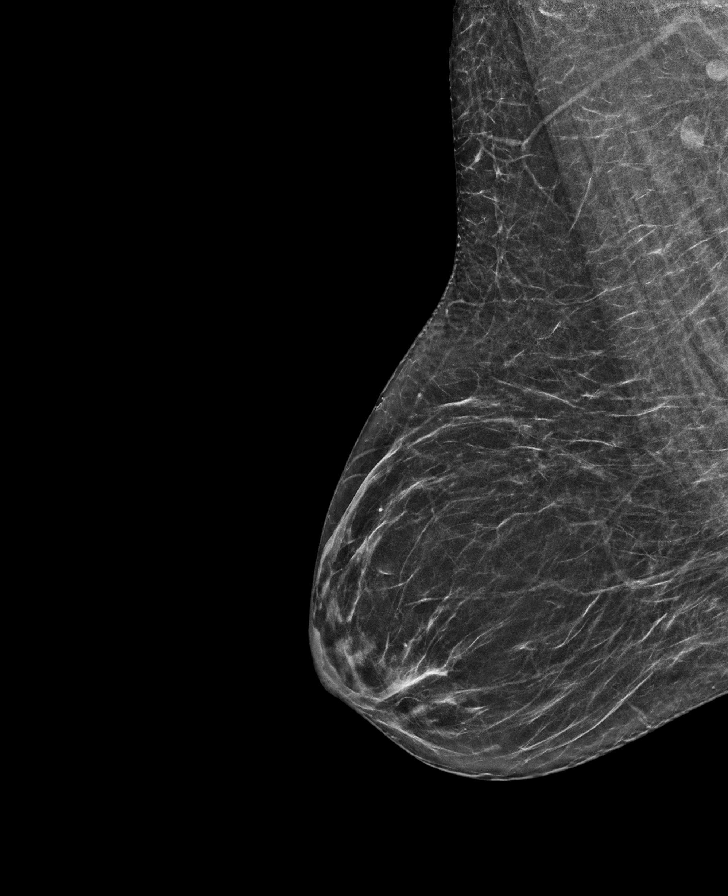

[L MLO synth-2D]
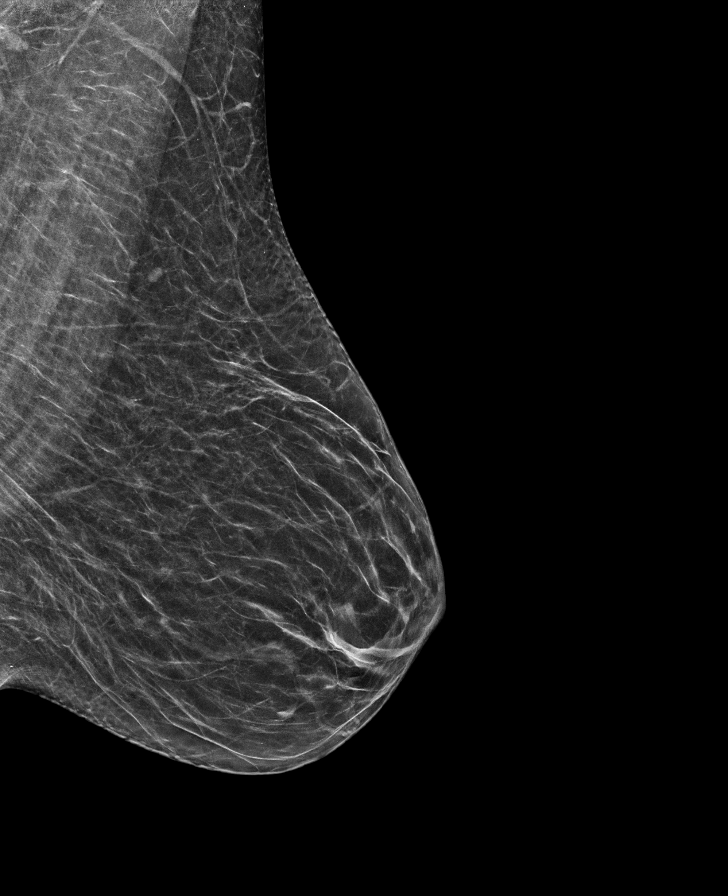

[L MLO tomo · tomo slice 29/58.0]
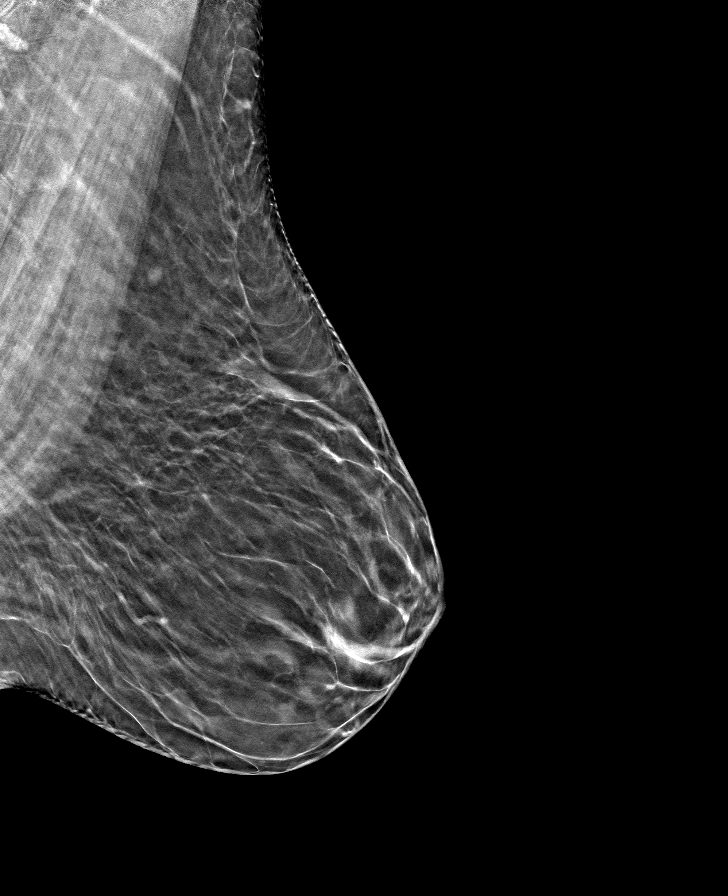

[R MLO tomo · tomo slice 29/58.0]
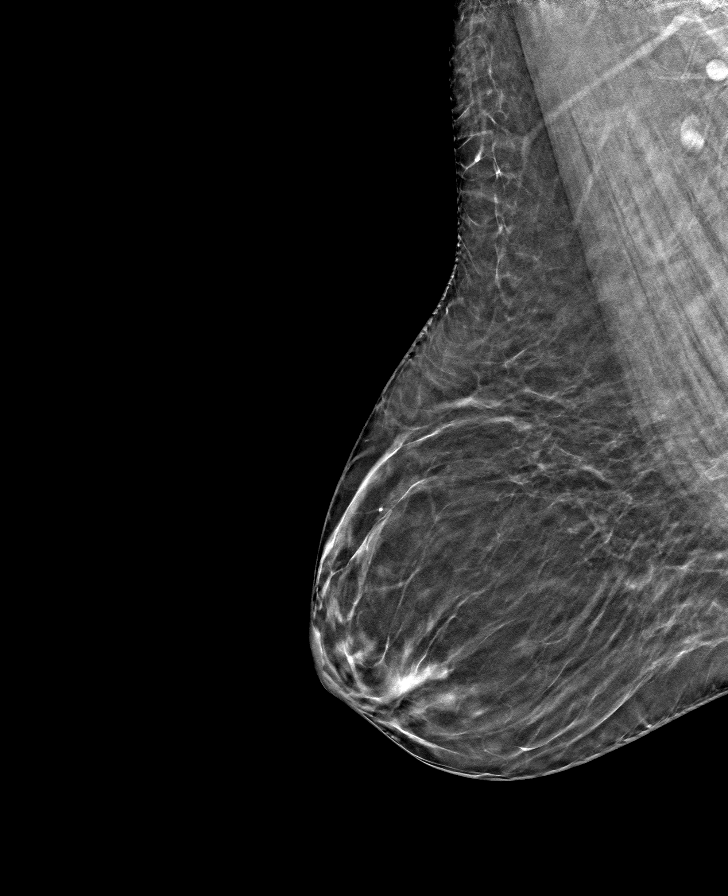

[R CC tomo · tomo slice 30/59.0]
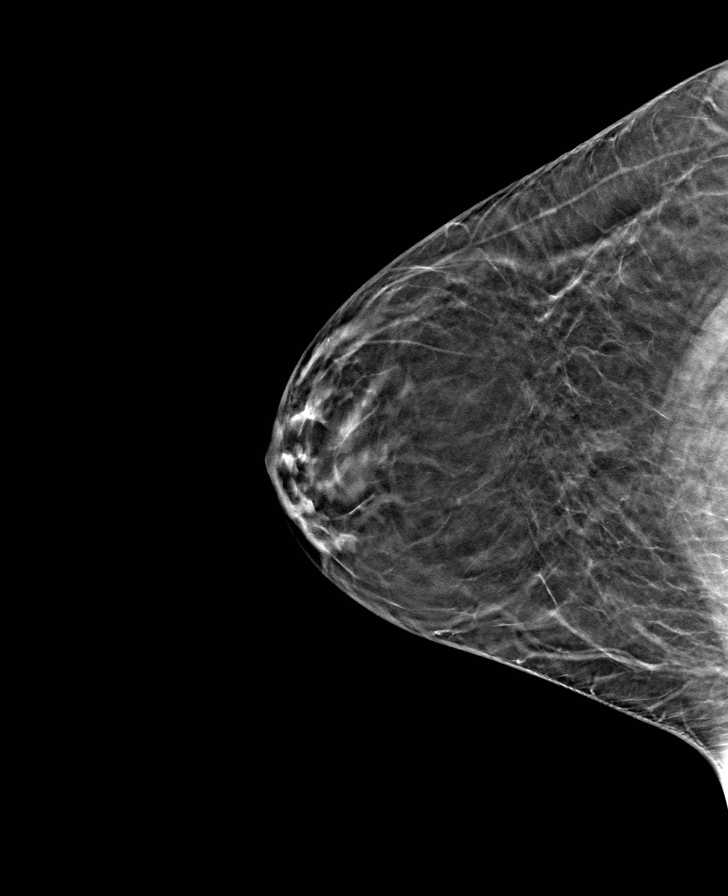

[L CC tomo · tomo slice 29/57.0]
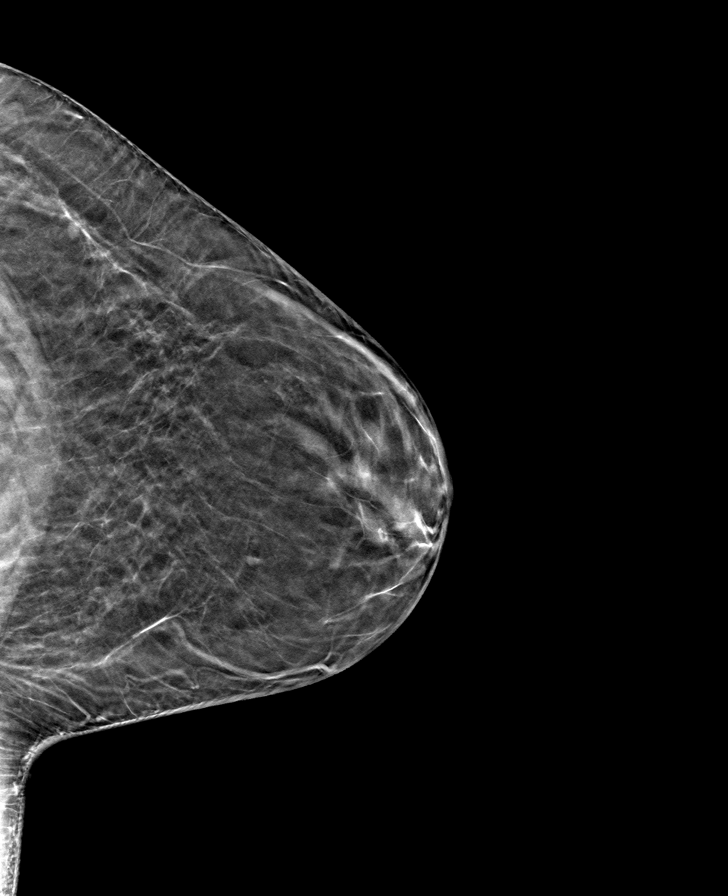

[8 of 24 positions shown; findings below may reference images not displayed]

ACR Breast Density Category b: There are scattered areas of
fibroglandular density.
FINDINGS: There are no findings suspicious for malignancy.
IMPRESSION: No mammographic evidence of malignancy. A result letter of this
screening mammogram will be mailed directly to the patient.

RECOMMENDATION:
Screening mammogram in one year. (Code:51-O-LD2)

BI-RADS CATEGORY  1: Negative.

## 2022-04-07 ENCOUNTER — Other Ambulatory Visit: Payer: Self-pay | Admitting: Family Medicine

## 2022-04-07 DIAGNOSIS — Z1231 Encounter for screening mammogram for malignant neoplasm of breast: Secondary | ICD-10-CM

## 2022-04-08 ENCOUNTER — Encounter: Payer: Self-pay | Admitting: Gastroenterology

## 2022-04-16 DIAGNOSIS — Z1231 Encounter for screening mammogram for malignant neoplasm of breast: Secondary | ICD-10-CM

## 2022-04-24 ENCOUNTER — Ambulatory Visit (AMBULATORY_SURGERY_CENTER): Payer: BC Managed Care – PPO | Admitting: *Deleted

## 2022-04-24 VITALS — Ht 62.0 in | Wt 160.0 lb

## 2022-04-24 DIAGNOSIS — Z1211 Encounter for screening for malignant neoplasm of colon: Secondary | ICD-10-CM

## 2022-04-24 MED ORDER — NA SULFATE-K SULFATE-MG SULF 17.5-3.13-1.6 GM/177ML PO SOLN: 1.0000 | Freq: Once | ORAL | 0 refills | Status: AC

## 2022-04-24 NOTE — Progress Notes (Addendum)
No egg or soy allergy known to patient  No issues known to pt with past sedation with any surgeries or procedures Patient denies ever being told they had issues or difficulty with intubation  No FH of Malignant Hyperthermia Pt is not on diet pills Pt is not on  home 02  Pt is not on blood thinners  Pt denies issues with constipation  Pt is not on dialysis Pt denies any upcoming cardiac testing Pt encouraged to use to use Singlecare or Goodrx to reduce cost  Patient's chart reviewed by Osvaldo Angst CNRA prior to previsit and patient appropriate for the Heath.  Previsit completed and red dot placed by patient's name on their procedure day (on provider's schedule).  . Visit by phone Instructions reviewed with pt and pt states understanding. Instructed to review again prior to procedure. Pt states they will. Instructions sent by Fernande Boyden with couponl and my chart

## 2022-05-03 ENCOUNTER — Other Ambulatory Visit: Payer: Self-pay | Admitting: Family Medicine

## 2022-05-07 ENCOUNTER — Ambulatory Visit (INDEPENDENT_AMBULATORY_CARE_PROVIDER_SITE_OTHER): Payer: BC Managed Care – PPO

## 2022-05-07 ENCOUNTER — Encounter: Payer: Self-pay | Admitting: Gastroenterology

## 2022-05-07 DIAGNOSIS — Z1231 Encounter for screening mammogram for malignant neoplasm of breast: Secondary | ICD-10-CM

## 2022-05-22 ENCOUNTER — Encounter: Payer: BC Managed Care – PPO | Admitting: Gastroenterology

## 2022-06-19 ENCOUNTER — Encounter: Payer: Self-pay | Admitting: Gastroenterology

## 2022-06-24 ENCOUNTER — Ambulatory Visit (AMBULATORY_SURGERY_CENTER): Payer: BC Managed Care – PPO | Admitting: Gastroenterology

## 2022-06-24 ENCOUNTER — Encounter: Payer: Self-pay | Admitting: Gastroenterology

## 2022-06-24 VITALS — BP 105/71 | HR 82 | Temp 98.2°F | Resp 17 | Ht 62.0 in | Wt 160.0 lb

## 2022-06-24 DIAGNOSIS — Z1211 Encounter for screening for malignant neoplasm of colon: Secondary | ICD-10-CM

## 2022-06-24 DIAGNOSIS — D123 Benign neoplasm of transverse colon: Secondary | ICD-10-CM

## 2022-06-24 DIAGNOSIS — Z1212 Encounter for screening for malignant neoplasm of rectum: Secondary | ICD-10-CM

## 2022-06-24 NOTE — Progress Notes (Signed)
Called to room to assist during endoscopic procedure.  Patient ID and intended procedure confirmed with present staff. Received instructions for my participation in the procedure from the performing physician.  

## 2022-06-24 NOTE — Progress Notes (Signed)
Pt's states no medical or surgical changes since previsit or office visit. 

## 2022-06-24 NOTE — Op Note (Signed)
[facility] Patient Name: Valerie Ruiz Procedure Date: 06/24/2022 8:43 AM MRN: [id number] Endoscopist: Meryl Dare , MD, [phone] Age: 47 Referring MD:  Date of Birth: [birth year] Gender: Female Account #: [id number] Procedure:                Colonoscopy Indications:              Screening for colorectal malignant neoplasm Medicines:                Monitored Anesthesia Care Procedure:                Pre-Anesthesia Assessment:                           - Prior to the procedure, a History and Physical                            was performed, and patient medications and                            allergies were reviewed. The patient's tolerance of                            previous anesthesia was also reviewed. The risks                            and benefits of the procedure and the sedation                            options and risks were discussed with the patient.                            All questions were answered, and informed consent                            was obtained. Prior Anticoagulants: The patient has                            taken no anticoagulant or antiplatelet agents. ASA                            Grade Assessment: II - A patient with mild systemic                            disease. After reviewing the risks and benefits,                            the patient was deemed in satisfactory condition to                            undergo the procedure.                           After obtaining informed consent, the colonoscope  was passed under direct vision. Throughout the                            procedure, the patient's blood pressure, pulse, and                            oxygen saturations were monitored continuously. The                            Olympus PCF-[id number] (#[id number]) Colonoscope was                            introduced through the anus and advanced to the the                            cecum,  identified by appendiceal orifice and                            ileocecal valve. The ileocecal valve, appendiceal                            orifice, and rectum were photographed. The quality                            of the bowel preparation was good. The colonoscopy                            was performed without difficulty. The patient                            tolerated the procedure well. Scope In: 8:48:43 AM Scope Out: 9:04:45 AM Scope Withdrawal Time: 0 hours 12 minutes 3 seconds  Total Procedure Duration: 0 hours 16 minutes 2 seconds  Findings:                 The perianal and digital rectal examinations were                            normal.                           A 7 mm polyp was found in the transverse colon. The                            polyp was sessile. The polyp was removed with a                            cold snare. Resection and retrieval were complete.                           The exam was otherwise without abnormality on                            direct and retroflexion views. Complications:  No immediate complications. Estimated blood loss:                            None. Estimated Blood Loss:     Estimated blood loss: none. Impression:               - One 7 mm polyp in the transverse colon, removed                            with a cold snare. Resected and retrieved.                           - The examination was otherwise normal on direct                            and retroflexion views. Recommendation:           - Repeat colonoscopy after studies are complete for                            surveillance based on pathology results.                           - Patient has a contact number available for                            emergencies. The signs and symptoms of potential                            delayed complications were discussed with the                            patient. Return to normal activities tomorrow.                             Written discharge instructions were provided to the                            patient.                           - Resume previous diet.                           - Continue present medications.                           - Await pathology results. Meryl Dare, MD 06/24/2022 9:07:21 AM This report has been signed electronically.

## 2022-06-24 NOTE — Progress Notes (Signed)
   Established Patient Office Visit  Subjective   Patient ID: Valerie Ruiz, female    DOB: 1975-09-12  Age: 47 y.o. MRN: [id number]  Chief Complaint  Patient presents with   Colonoscopy    Screening     HPI    ROS    Objective:     Blood Pressure (Abnormal) 95/58   Pulse 87   Temperature 98.2 F (36.8 C)   Respiration (Abnormal) 21   Height  (1.575 m)   Weight 160 lb (72.6 kg)   Oxygen Saturation 100%   Body Mass Index 29.26 kg/m    Physical Exam   No results found for any visits on 06/24/22.    The 10-year ASCVD risk score (Arnett DK, et al., 2019) is: 0.3%    Assessment & Plan:   Problem List Items Addressed This Visit   None Visit Diagnoses     Screening for colorectal cancer    -  Primary   Benign neoplasm of transverse colon           No follow-ups on file.    Christiana Fuchs, CRNA

## 2022-06-24 NOTE — Progress Notes (Signed)
History & Physical  Primary Care Physician:  [name], MD Primary Gastroenterologist: Claudette Head, MD  Impression / Plan:  Average CRC screening for colonoscopy.   CHIEF COMPLAINT:  CRC screening   HPI: Valerie Ruiz is a 46 y.o. female average CRC screening for colonoscopy.   Past Medical History:  Diagnosis Date   Acute bronchitis 08/28/2015   Allergic rhinitis    Anemia 05/08/2016   Asthma    Chronic headaches    Depression    Glaucoma    History of ovulatory pain 09/10/2014   LLQ pain 09/10/2014   Preventative health care 12/24/2014   Preventative health care 12/24/2014    Past Surgical History:  Procedure Laterality Date   ANKLE SURGERY     Broken ankle    Prior to Admission medications   Medication Sig Start Date End Date Taking? Authorizing Provider  lamoTRIgine (LAMICTAL) 200 MG tablet Take 1 tablet (200 mg total) by mouth at bedtime. 06/14/20   [name], MD  lurasidone (LATUDA) 20 MG TABS tablet Take 20 mg by mouth at bedtime. 06/06/20   [provider]  metoprolol tartrate (LOPRESSOR) 25 MG tablet Take 0.5 tablets (12.5 mg total) by mouth 2 (two) times daily. 09/26/21   [name], MD  sertraline (ZOLOFT) 50 MG tablet TAKE ONE TABLET BY MOUTH ONE TIME DAILY 05/05/22   [name], MD    Current Outpatient Medications  Medication Sig Dispense Refill   lamoTRIgine (LAMICTAL) 200 MG tablet Take 1 tablet (200 mg total) by mouth at bedtime.     lurasidone (LATUDA) 20 MG TABS tablet Take 20 mg by mouth at bedtime.     metoprolol tartrate (LOPRESSOR) 25 MG tablet Take 0.5 tablets (12.5 mg total) by mouth 2 (two) times daily. 90 tablet 3   sertraline (ZOLOFT) 50 MG tablet TAKE ONE TABLET BY MOUTH ONE TIME DAILY 90 tablet 1   No current facility-administered medications for this visit.    Allergies as of 06/24/2022   (No Known Allergies)    Family History  Problem Relation Age of Onset   Hyperthyroidism Mother    Obesity  Mother    Gout Father    Stroke Father    COPD Maternal Aunt    Heart disease Maternal Grandmother 68       MI   Stroke Maternal Grandfather    Alcohol abuse Maternal Grandfather    Breast cancer Paternal Grandmother    Cancer Paternal Grandmother        metastatic breast with mets to bone   Depression Paternal Grandmother    Colon cancer Neg Hx    Colon polyps Neg Hx    Esophageal cancer Neg Hx    Rectal cancer Neg Hx    Stomach cancer Neg Hx     Social History   Socioeconomic History   Marital status: Married    Spouse name: Not on file   Number of children: Not on file   Years of education: Not on file   Highest education level: Not on file  Occupational History   Not on file  Tobacco Use   Smoking status: Never   Smokeless tobacco: Never  Substance and Sexual Activity   Alcohol use: No    Alcohol/week: 3.0 standard drinks of alcohol    Types: 3 Standard drinks or equivalent per week   Drug use: No   Sexual activity: Yes    Partners: Female    Birth control/protection: None  Comment: lives with spouse, works as Designer, industrial/product support at [employer]   Other Topics Concern   Not on file  Social History Narrative   Not on file   Social Determinants of Health   Financial Resource Strain: Not on file  Food Insecurity: Not on file  Transportation Needs: Not on file  Physical Activity: Not on file  Stress: Not on file  Social Connections: Not on file  Intimate Partner Violence: Not on file    Review of Systems:  All systems reviewed were negative except where noted in HPI.   Physical Exam: General:  Alert, well-developed, in NAD Head:  Normocephalic and atraumatic. Eyes:  Sclera clear, no icterus.   Conjunctiva pink. Ears:  Normal auditory acuity. Mouth:  No deformity or lesions.  Neck:  Supple; no masses. Lungs:  Clear throughout to auscultation.   No wheezes, crackles, or rhonchi.  Heart:  Regular rate and rhythm; no  murmurs. Abdomen:  Soft, nondistended, nontender. No masses, hepatomegaly. No palpable masses.  Normal bowel sounds.    Rectal:  Deferred   Msk:  Symmetrical without gross deformities. Extremities:  Without edema. Neurologic:  Alert and  oriented x 4; grossly normal neurologically. Skin:  Intact without significant lesions or rashes. Psych:  Alert and cooperative. Normal mood and affect.   Venita Lick. Russella Dar  06/24/2022, 8:30 AM See Loretha Stapler, [facility], to contact our on call provider

## 2022-06-24 NOTE — Progress Notes (Signed)
A and O x3. Report to RN. Tolerated MAC anesthesia well. 

## 2022-06-24 NOTE — Patient Instructions (Addendum)
Recommendation: Repeat colonoscopy after studies are complete for                            surveillance based on pathology results.                           - Patient has a contact number available for                            emergencies. The signs and symptoms of potential                            delayed complications were discussed with the                            patient. Return to normal activities tomorrow.                            Written discharge instructions were provided to the                            patient.                           - Resume previous diet.                           - Continue present medications.                           - Await pathology results.  Handout on polyps given.  YOU HAD AN ENDOSCOPIC PROCEDURE TODAY AT THE [facility]:   Refer to the procedure report that was given to you for any specific questions about what was found during the examination.  If the procedure report does not answer your questions, please call your gastroenterologist to clarify.  If you requested that your care partner not be given the details of your procedure findings, then the procedure report has been included in a sealed envelope for you to review at your convenience later.  YOU SHOULD EXPECT: Some feelings of bloating in the abdomen. Passage of more gas than usual.  Walking can help get rid of the air that was put into your GI tract during the procedure and reduce the bloating. If you had a lower endoscopy (such as a colonoscopy or flexible sigmoidoscopy) you may notice spotting of blood in your stool or on the toilet paper. If you underwent a bowel prep for your procedure, you may not have a normal bowel movement for a few days.  Please Note:  You might notice some irritation and congestion in your nose or some drainage.  This is from the oxygen used during your procedure.  There is no need for concern and it should clear up in a day or so.  SYMPTOMS  TO REPORT IMMEDIATELY:  Following lower endoscopy (colonoscopy or flexible sigmoidoscopy):  Excessive amounts of blood in the stool  Significant tenderness or worsening of abdominal pains  Swelling of the abdomen that is new, acute  Fever of 100F or higher  For urgent or emergent  issues, a gastroenterologist can be reached at any hour by calling [phone]. Do not use MyChart messaging for urgent concerns.    DIET:  We do recommend a small meal at first, but then you may proceed to your regular diet.  Drink plenty of fluids but you should avoid alcoholic beverages for 24 hours.  ACTIVITY:  You should plan to take it easy for the rest of today and you should NOT DRIVE or use heavy machinery until tomorrow (because of the sedation medicines used during the test).    FOLLOW UP: Our staff will call the number listed on your records the next business day following your procedure.  We will call around 7:15- 8:00 am to check on you and address any questions or concerns that you may have regarding the information given to you following your procedure. If we do not reach you, we will leave a message.     If any biopsies were taken you will be contacted by phone or by letter within the next 1-3 weeks.  Please call us at [phone] if you have not heard about the biopsies in 3 weeks.    SIGNATURES/CONFIDENTIALITY: You and/or your care partner have signed paperwork which will be entered into your electronic medical record.  These signatures attest to the fact that that the information above on your After Visit Summary has been reviewed and is understood.  Full responsibility of the confidentiality of this discharge information lies with you and/or your care-partner.

## 2022-06-25 ENCOUNTER — Telehealth: Payer: Self-pay

## 2022-06-25 NOTE — Telephone Encounter (Signed)
Attempted to reach patient for post-procedure f/u call. No answer. Left message for her to please not hesitate to call if she has any questions/concerns regarding her care. 

## 2022-07-05 DIAGNOSIS — Z860100 Personal history of colon polyps, unspecified: Secondary | ICD-10-CM | POA: Insufficient documentation

## 2022-07-05 NOTE — Assessment & Plan Note (Signed)
-  Colonoscopy 2024

## 2022-07-05 NOTE — Assessment & Plan Note (Signed)
On Adderall 20

## 2022-07-05 NOTE — Assessment & Plan Note (Signed)
Patient encouraged to maintain heart healthy diet, regular exercise, adequate sleep. Consider daily probiotics. Take medications as prescribed. Labs ordered and reviewed Given and reviewed copy of ACP documents from [employer] and encouraged to complete and return  Mgm 05/2022 with ob/gyn repeat in 2025 Pap 01/2021 repeat in 2025 or 26 Colonoscopy 06/2022 need pathology

## 2022-07-07 ENCOUNTER — Ambulatory Visit (INDEPENDENT_AMBULATORY_CARE_PROVIDER_SITE_OTHER): Payer: BC Managed Care – PPO | Admitting: Family Medicine

## 2022-07-07 ENCOUNTER — Encounter: Payer: Self-pay | Admitting: Family Medicine

## 2022-07-07 ENCOUNTER — Encounter: Payer: Self-pay | Admitting: Gastroenterology

## 2022-07-07 VITALS — BP 110/70 | HR 75 | Temp 98.0°F | Resp 16 | Ht 62.0 in | Wt 150.2 lb

## 2022-07-07 DIAGNOSIS — F988 Other specified behavioral and emotional disorders with onset usually occurring in childhood and adolescence: Secondary | ICD-10-CM | POA: Diagnosis not present

## 2022-07-07 DIAGNOSIS — Z0000 Encounter for general adult medical examination without abnormal findings: Secondary | ICD-10-CM | POA: Diagnosis not present

## 2022-07-07 DIAGNOSIS — Z86010 Personal history of colonic polyps: Secondary | ICD-10-CM

## 2022-07-07 DIAGNOSIS — E785 Hyperlipidemia, unspecified: Secondary | ICD-10-CM

## 2022-07-07 DIAGNOSIS — Z803 Family history of malignant neoplasm of breast: Secondary | ICD-10-CM | POA: Diagnosis not present

## 2022-07-07 LAB — CBC WITH DIFFERENTIAL/PLATELET
Basophils Absolute: 0 10*3/uL (ref 0.0–0.1)
Basophils Relative: 0.9 % (ref 0.0–3.0)
Eosinophils Absolute: 0 10*3/uL (ref 0.0–0.7)
Eosinophils Relative: 1 % (ref 0.0–5.0)
HCT: 41.4 % (ref 36.0–46.0)
Hemoglobin: 14 g/dL (ref 12.0–15.0)
Lymphocytes Relative: 20.3 % (ref 12.0–46.0)
Lymphs Abs: 1 10*3/uL (ref 0.7–4.0)
MCHC: 33.7 g/dL (ref 30.0–36.0)
MCV: 93.8 fl (ref 78.0–100.0)
Monocytes Absolute: 0.5 10*3/uL (ref 0.1–1.0)
Monocytes Relative: 9.3 % (ref 3.0–12.0)
Neutro Abs: 3.3 10*3/uL (ref 1.4–7.7)
Neutrophils Relative %: 68.5 % (ref 43.0–77.0)
Platelets: 270 10*3/uL (ref 150.0–400.0)
RBC: 4.41 Mil/uL (ref 3.87–5.11)
RDW: 12.1 % (ref 11.5–15.5)
WBC: 4.9 10*3/uL (ref 4.0–10.5)

## 2022-07-07 LAB — COMPREHENSIVE METABOLIC PANEL
ALT: 7 U/L (ref 0–35)
AST: 14 U/L (ref 0–37)
Albumin: 4.5 g/dL (ref 3.5–5.2)
Alkaline Phosphatase: 62 U/L (ref 39–117)
BUN: 9 mg/dL (ref 6–23)
CO2: 28 mEq/L (ref 19–32)
Calcium: 9.4 mg/dL (ref 8.4–10.5)
Chloride: 101 mEq/L (ref 96–112)
Creatinine, Ser: 0.71 mg/dL (ref 0.40–1.20)
GFR: 102.72 mL/min (ref >60.00)
Glucose, Bld: 70 mg/dL (ref 70–99)
Potassium: 4.1 mEq/L (ref 3.5–5.1)
Sodium: 139 mEq/L (ref 135–145)
Total Bilirubin: 0.6 mg/dL (ref 0.2–1.2)
Total Protein: 7.4 g/dL (ref 6.0–8.3)

## 2022-07-07 LAB — LIPID PANEL
Cholesterol: 188 mg/dL (ref 0–200)
HDL: 60.6 mg/dL (ref >39.00)
LDL Cholesterol: 117 mg/dL — ABNORMAL HIGH (ref 0–99)
NonHDL: 127.83
Total CHOL/HDL Ratio: 3
Triglycerides: 53 mg/dL (ref 0.0–149.0)
VLDL: 10.6 mg/dL (ref 0.0–40.0)

## 2022-07-07 LAB — TSH: TSH: 1.17 u[IU]/mL (ref 0.35–5.50)

## 2022-07-07 NOTE — Assessment & Plan Note (Signed)
Referred for consideration of genetic screening

## 2022-07-07 NOTE — Patient Instructions (Addendum)
Hydrate up  Preventive Care 91-46 Years Old, Female Preventive care refers to lifestyle choices and visits with your health care provider that can promote health and wellness. Preventive care visits are also called wellness exams. What can I expect for my preventive care visit? Counseling Your health care provider may ask you questions about your: Medical history, including: Past medical problems. Family medical history. Pregnancy history. Current health, including: Menstrual cycle. Method of birth control. Emotional well-being. Home life and relationship well-being. Sexual activity and sexual health. Lifestyle, including: Alcohol, nicotine or tobacco, and drug use. Access to firearms. Diet, exercise, and sleep habits. Work and work Astronomer. Sunscreen use. Safety issues such as seatbelt and bike helmet use. Physical exam Your health care provider will check your: Height and weight. These may be used to calculate your BMI (body mass index). BMI is a measurement that tells if you are at a healthy weight. Waist circumference. This measures the distance around your waistline. This measurement also tells if you are at a healthy weight and may help predict your risk of certain diseases, such as type 2 diabetes and high blood pressure. Heart rate and blood pressure. Body temperature. Skin for abnormal spots. What immunizations do I need?  Vaccines are usually given at various ages, according to a schedule. Your health care provider will recommend vaccines for you based on your age, medical history, and lifestyle or other factors, such as travel or where you work. What tests do I need? Screening Your health care provider may recommend screening tests for certain conditions. This may include: Lipid and cholesterol levels. Diabetes screening. This is done by checking your blood sugar (glucose) after you have not eaten for a while (fasting). Pelvic exam and Pap test. Hepatitis B  test. Hepatitis C test. HIV (human immunodeficiency virus) test. STI (sexually transmitted infection) testing, if you are at risk. Lung cancer screening. Colorectal cancer screening. Mammogram. Talk with your health care provider about when you should start having regular mammograms. This may depend on whether you have a family history of breast cancer. BRCA-related cancer screening. This may be done if you have a family history of breast, ovarian, tubal, or peritoneal cancers. Bone density scan. This is done to screen for osteoporosis. Talk with your health care provider about your test results, treatment options, and if necessary, the need for more tests. Follow these instructions at home: Eating and drinking  Eat a diet that includes fresh fruits and vegetables, whole grains, lean protein, and low-fat dairy products. Take vitamin and mineral supplements as recommended by your health care provider. Do not drink alcohol if: Your health care provider tells you not to drink. You are pregnant, may be pregnant, or are planning to become pregnant. If you drink alcohol: Limit how much you have to 0-1 drink a day. Know how much alcohol is in your drink. In the [place] one drink equals one 12 oz bottle of beer (355 mL), one 5 oz glass of wine (148 mL), or one 1 oz glass of hard liquor (44 mL). Lifestyle Brush your teeth every morning and night with fluoride toothpaste. Floss one time each day. Exercise for at least 30 minutes 5 or more days each week. Do not use any products that contain nicotine or tobacco. These products include cigarettes, chewing tobacco, and vaping devices, such as e-cigarettes. If you need help quitting, ask your health care provider. Do not use drugs. If you are sexually active, practice safe sex. Use a condom or other form  of protection to prevent STIs. If you do not wish to become pregnant, use a form of birth control. If you plan to become pregnant, see your health care  provider for a prepregnancy visit. Take aspirin only as told by your health care provider. Make sure that you understand how much to take and what form to take. Work with your health care provider to find out whether it is safe and beneficial for you to take aspirin daily. Find healthy ways to manage stress, such as: Meditation, yoga, or listening to music. Journaling. Talking to a trusted person. Spending time with friends and family. Minimize exposure to UV radiation to reduce your risk of skin cancer. Safety Always wear your seat belt while driving or riding in a vehicle. Do not drive: If you have been drinking alcohol. Do not ride with someone who has been drinking. When you are tired or distracted. While texting. If you have been using any mind-altering substances or drugs. Wear a helmet and other protective equipment during sports activities. If you have firearms in your house, make sure you follow all gun safety procedures. Seek help if you have been physically or sexually abused. What's next? Visit your health care provider once a year for an annual wellness visit. Ask your health care provider how often you should have your eyes and teeth checked. Stay up to date on all vaccines. This information is not intended to replace advice given to you by your health care provider. Make sure you discuss any questions you have with your health care provider. Document Revised: 08/15/2020 Document Reviewed: 08/15/2020 Elsevier Patient Education  2023 [employer].

## 2022-07-07 NOTE — Progress Notes (Signed)
Subjective:   By signing my name below, I, Valerie Ruiz, attest that this documentation has been prepared under the direction and in the presence of [name], MD. 07/07/2022   Patient ID: Valerie Ruiz, female    DOB: December 09, 1975, 47 y.o.   MRN: [id number]  Chief Complaint  Patient presents with   Annual Exam    Annual Exam    HPI Patient is in today for a comprehensive physical exam.   Allergies She has not had any major allergy flare-ups recently.  Acute She denies any recent illness, cough, difficulty swallowing or breathing, joint pain, hearing issues, or vision problems  Colonoscopy Last colonoscopy completed on 06/24/2022. A 7 mm transverse polyp was found in the transverse colon, otherwise results are normal. Repeat in 10 years.   Pap Smear Last pap smear completed on 02/13/2021. Results are normal. Repeat in 3 years.   Mammogram Last mammogram completed on 05/07/2022. Results are normal. Repeat in one year.   Social history Her paternal grandmother has a history of breast cancer and bone cancer and passed at 46 years old. Her maternal grandmother and maternal grandfather are in their 43's. No changes in family history.   Advanced directives  She has advanced directives. Paperwork for medical advanced directives discussed and given during thi visit.   Immunizations She is UTD on the tetanus immunization.   Past Medical History:  Diagnosis Date   Acute bronchitis 08/28/2015   Allergic rhinitis    Anemia 05/08/2016   Asthma    Chronic headaches    Depression    Glaucoma    History of ovulatory pain 09/10/2014   LLQ pain 09/10/2014   Preventative health care 12/24/2014   Preventative health care 12/24/2014    Past Surgical History:  Procedure Laterality Date   ANKLE SURGERY     Broken ankle    Family History  Problem Relation Age of Onset   Hyperthyroidism Mother    Obesity Mother    Gout Father    Stroke Father    COPD Maternal Aunt     Heart disease Maternal Grandmother 81       MI   Stroke Maternal Grandfather    Alcohol abuse Maternal Grandfather    Cancer Paternal Grandmother        metastatic breast with mets to bone   Breast cancer Paternal Grandmother    Depression Paternal Grandmother    Colon cancer Neg Hx    Colon polyps Neg Hx    Esophageal cancer Neg Hx    Rectal cancer Neg Hx    Stomach cancer Neg Hx     Social History   Socioeconomic History   Marital status: Married    Spouse name: Not on file   Number of children: Not on file   Years of education: Not on file   Highest education level: Not on file  Occupational History   Not on file  Tobacco Use   Smoking status: Never   Smokeless tobacco: Never  Substance and Sexual Activity   Alcohol use: No    Alcohol/week: 3.0 standard drinks of alcohol    Types: 3 Standard drinks or equivalent per week   Drug use: No   Sexual activity: Yes    Partners: Female    Birth control/protection: None    Comment: lives with spouse, works as Designer, industrial/product support at [employer]   Other Topics Concern   Not on file  Social History Narrative  Not on file   Social Determinants of Health   Financial Resource Strain: Not on file  Food Insecurity: Not on file  Transportation Needs: Not on file  Physical Activity: Not on file  Stress: Not on file  Social Connections: Not on file  Intimate Partner Violence: Not on file    Outpatient Medications Prior to Visit  Medication Sig Dispense Refill   amphetamine-dextroamphetamine (ADDERALL) 20 MG tablet      lamoTRIgine (LAMICTAL) 200 MG tablet Take 1 tablet (200 mg total) by mouth at bedtime.     lurasidone (LATUDA) 20 MG TABS tablet Take 20 mg by mouth at bedtime.     metoprolol tartrate (LOPRESSOR) 25 MG tablet Take 0.5 tablets (12.5 mg total) by mouth 2 (two) times daily. 90 tablet 3   sertraline (ZOLOFT) 50 MG tablet TAKE ONE TABLET BY MOUTH ONE TIME DAILY 90 tablet 1   No  facility-administered medications prior to visit.    No Known Allergies  Review of Systems  HENT:  Negative for hearing loss.   Eyes:        (-) visual disturbance  Respiratory:  Negative for cough and shortness of breath.   Musculoskeletal:  Negative for joint pain.       Objective:    Physical Exam Constitutional:      General: She is not in acute distress.    Appearance: Normal appearance.  HENT:     Head: Normocephalic and atraumatic.     Right Ear: Tympanic membrane, ear canal and external ear normal.     Left Ear: Tympanic membrane, ear canal and external ear normal.  Eyes:     Extraocular Movements: Extraocular movements intact.     Right eye: No nystagmus.     Left eye: No nystagmus.     Pupils: Pupils are equal, round, and reactive to light.  Cardiovascular:     Rate and Rhythm: Normal rate and regular rhythm.     Heart sounds: Normal heart sounds. No murmur heard.    No gallop.  Pulmonary:     Effort: Pulmonary effort is normal. No respiratory distress.     Breath sounds: Normal breath sounds. No wheezing or rales.  Abdominal:     General: Bowel sounds are normal. There is no distension.     Palpations: Abdomen is soft.     Tenderness: There is no abdominal tenderness. There is no guarding.  Musculoskeletal:     Cervical back: Normal range of motion.     Comments: (+) 5/5 strength in upper and lower extremities  Lymphadenopathy:     Cervical: No cervical adenopathy.  Skin:    General: Skin is warm.  Neurological:     Mental Status: She is alert and oriented to person, place, and time.  Psychiatric:        Judgment: Judgment normal.     BP 110/70 (BP Location: Right Arm)   Pulse 75   Temp 98 F (36.7 C) (Oral)   Resp 16   Ht 5\' 2"  (1.575 m)   Wt 150 lb 3.2 oz (68.1 kg)   SpO2 97%   BMI 27.47 kg/m  Wt Readings from Last 3 Encounters:  07/07/22 150 lb 3.2 oz (68.1 kg)  06/24/22 160 lb (72.6 kg)  04/24/22 160 lb (72.6 kg)       Assessment &  Plan:  Attention deficit disorder (ADD) without hyperactivity Assessment & Plan: On Adderall 20  Orders: -     CBC with Differential/Platelet  Preventative health care Assessment & Plan: Patient encouraged to maintain heart healthy diet, regular exercise, adequate sleep. Consider daily probiotics. Take medications as prescribed. Labs ordered and reviewed Given and reviewed copy of ACP documents from [employer] and encouraged to complete and return  Mgm 05/2022 with ob/gyn repeat in 2025 Pap 01/2021 repeat in 2025 or 27 Colonoscopy 06/2022 need pathology  Orders: -     CBC with Differential/Platelet -     TSH  History of colon polyps Assessment & Plan: Colonoscopy 2024   Orders: -     CBC with Differential/Platelet  Family history of breast cancer Assessment & Plan: Referred for consideration of genetic screening   Orders: -     Ambulatory referral to Genetics  Hyperlipidemia, unspecified hyperlipidemia type -     CBC with Differential/Platelet -     Comprehensive metabolic panel -     Lipid panel -     TSH    I, Danise Edge, MD, personally preformed the services described in this documentation.  All medical record entries made by the scribe were at my direction and in my presence.  I have reviewed the chart and discharge instructions (if applicable) and agree that the record reflects my personal performance and is accurate and complete. 07/07/2022  Danise Edge, MD

## 2022-10-07 ENCOUNTER — Other Ambulatory Visit: Payer: Self-pay | Admitting: Family Medicine

## 2022-10-30 ENCOUNTER — Other Ambulatory Visit: Payer: Self-pay | Admitting: Family Medicine

## 2023-01-05 ENCOUNTER — Other Ambulatory Visit: Payer: Self-pay | Admitting: Family Medicine

## 2023-04-19 NOTE — Assessment & Plan Note (Signed)
 Encourage heart healthy diet such as MIND or DASH diet, increase exercise, avoid trans fats, simple carbohydrates and processed foods, consider a krill or fish or flaxseed oil cap daily.

## 2023-04-19 NOTE — Assessment & Plan Note (Signed)
Multiple meds in past.

## 2023-04-19 NOTE — Assessment & Plan Note (Signed)
Has tolerated meds in past

## 2023-04-20 ENCOUNTER — Encounter: Payer: Self-pay | Admitting: Family Medicine

## 2023-04-20 ENCOUNTER — Ambulatory Visit (INDEPENDENT_AMBULATORY_CARE_PROVIDER_SITE_OTHER): Payer: 59 | Admitting: Family Medicine

## 2023-04-20 VITALS — BP 110/74 | HR 83 | Temp 97.8°F | Resp 18 | Ht 62.0 in | Wt 139.2 lb

## 2023-04-20 DIAGNOSIS — R519 Headache, unspecified: Secondary | ICD-10-CM | POA: Diagnosis not present

## 2023-04-20 DIAGNOSIS — F3181 Bipolar II disorder: Secondary | ICD-10-CM

## 2023-04-20 DIAGNOSIS — H5710 Ocular pain, unspecified eye: Secondary | ICD-10-CM | POA: Diagnosis not present

## 2023-04-20 DIAGNOSIS — E785 Hyperlipidemia, unspecified: Secondary | ICD-10-CM

## 2023-04-20 DIAGNOSIS — F909 Attention-deficit hyperactivity disorder, unspecified type: Secondary | ICD-10-CM | POA: Diagnosis not present

## 2023-04-20 LAB — CBC WITH DIFFERENTIAL/PLATELET
Basophils Absolute: 0 10*3/uL (ref 0.0–0.1)
Basophils Relative: 0.8 % (ref 0.0–3.0)
Eosinophils Absolute: 0.1 10*3/uL (ref 0.0–0.7)
Eosinophils Relative: 1.7 % (ref 0.0–5.0)
HCT: 39.9 % (ref 36.0–46.0)
Hemoglobin: 13.6 g/dL (ref 12.0–15.0)
Lymphocytes Relative: 18.6 % (ref 12.0–46.0)
Lymphs Abs: 1 10*3/uL (ref 0.7–4.0)
MCHC: 34 g/dL (ref 30.0–36.0)
MCV: 94.4 fL (ref 78.0–100.0)
Monocytes Absolute: 0.5 10*3/uL (ref 0.1–1.0)
Monocytes Relative: 9.3 % (ref 3.0–12.0)
Neutro Abs: 3.9 10*3/uL (ref 1.4–7.7)
Neutrophils Relative %: 69.6 % (ref 43.0–77.0)
Platelets: 274 10*3/uL (ref 150.0–400.0)
RBC: 4.23 Mil/uL (ref 3.87–5.11)
RDW: 12.4 % (ref 11.5–15.5)
WBC: 5.6 10*3/uL (ref 4.0–10.5)

## 2023-04-20 LAB — COMPREHENSIVE METABOLIC PANEL
ALT: 13 U/L (ref 0–35)
AST: 14 U/L (ref 0–37)
Albumin: 4.4 g/dL (ref 3.5–5.2)
Alkaline Phosphatase: 58 U/L (ref 39–117)
BUN: 10 mg/dL (ref 6–23)
CO2: 28 meq/L (ref 19–32)
Calcium: 9.1 mg/dL (ref 8.4–10.5)
Chloride: 100 meq/L (ref 96–112)
Creatinine, Ser: 0.53 mg/dL (ref 0.40–1.20)
GFR: 111.11 mL/min (ref >60.00)
Glucose, Bld: 89 mg/dL (ref 70–99)
Potassium: 3.8 meq/L (ref 3.5–5.1)
Sodium: 137 meq/L (ref 135–145)
Total Bilirubin: 0.9 mg/dL (ref 0.2–1.2)
Total Protein: 6.9 g/dL (ref 6.0–8.3)

## 2023-04-20 LAB — LIPID PANEL
Cholesterol: 176 mg/dL (ref 0–200)
HDL: 55.4 mg/dL (ref >39.00)
LDL Cholesterol: 106 mg/dL — ABNORMAL HIGH (ref 0–99)
NonHDL: 120.15
Total CHOL/HDL Ratio: 3
Triglycerides: 72 mg/dL (ref 0.0–149.0)
VLDL: 14.4 mg/dL (ref 0.0–40.0)

## 2023-04-20 LAB — TSH: TSH: 1.57 u[IU]/mL (ref 0.35–5.50)

## 2023-04-20 LAB — SEDIMENTATION RATE: Sed Rate: 13 mm/h (ref 0–20)

## 2023-04-20 MED ORDER — SUMATRIPTAN SUCCINATE 50 MG PO TABS: 50.0000 mg | ORAL_TABLET | ORAL | 1 refills | Status: DC | PRN

## 2023-04-20 NOTE — Patient Instructions (Signed)
Ocular Migraine   Migraine Headache A migraine headache is an intense pulsing or throbbing pain on one or both sides of the head. Migraine headaches may also cause other symptoms, such as nausea, vomiting, and sensitivity to light and noise. A migraine headache can last from 4 hours to 3 days. Talk with your health care provider about what things may bring on (trigger) your migraine headaches. What are the causes? The exact cause is not known. However, a migraine may be caused when nerves in the brain get irritated and release chemicals that cause blood vessels to become inflamed. This inflammation causes pain. Migraines may be triggered or caused by: Smoking. Medicines, such as: Nitroglycerin, which is used to treat chest pain. Birth control pills. Estrogen. Certain blood pressure medicines. Foods or drinks that contain nitrates, glutamate, aspartame, MSG, or tyramine. Certain foods or drinks, such as aged cheeses, chocolate, alcohol, or caffeine. Doing physical activity that is very hard. Other triggers may include: Menstruation. Pregnancy. Hunger. Stress. Getting too much or too little sleep. Weather changes. Tiredness (fatigue). What increases the risk? The following factors may make you more likely to have migraine headaches: Being between the ages of 76-75 years old. Being female. Having a family history of migraine headaches. Being Caucasian. Having a mental health condition, such as depression or anxiety. Being obese. What are the signs or symptoms? The main symptom of this condition is pulsing or throbbing pain. This pain may: Happen in any area of the head, such as on one or both sides. Make it hard to do daily activities. Get worse with physical activity. Get worse around bright lights, loud noises, or smells. Other symptoms may include: Nausea. Vomiting. Dizziness. Before a migraine headache starts, you may get warning signs (an aura). An aura may  include: Seeing flashing lights or having blind spots. Seeing bright spots, halos, or zigzag lines. Having tunnel vision or blurred vision. Having numbness or a tingling feeling. Having trouble talking. Having muscle weakness. After a migraine ends, you may have symptoms. These may include: Feeling tired. Trouble concentrating. How is this diagnosed? A migraine headache can be diagnosed based on: Your symptoms. A physical exam. Tests, such as: A CT scan or an MRI of the head. These tests can help rule out other causes of headaches. Taking fluid from the spine (lumbar puncture) to examine it (cerebrospinal fluid analysis, or CSF analysis). How is this treated? This condition may be treated with medicines that: Relieve pain and nausea. Prevent migraines. Treatment may also include: Acupuncture. Lifestyle changes like avoiding foods that trigger migraine headaches. Learning ways to control your body (biofeedback). Talk therapy to help you know and deal with negative thoughts (cognitive behavioral therapy). Follow these instructions at home: Medicines Take over-the-counter and prescription medicines only as told by your provider. Ask your provider if the medicine prescribed to you: Requires you to avoid driving or using machinery. Can cause constipation. You may need to take these actions to prevent or treat constipation: Drink enough fluid to keep your pee (urine) pale yellow. Take over-the-counter or prescription medicines. Eat foods that are high in fiber, such as beans, whole grains, and fresh fruits and vegetables. Limit foods that are high in fat and processed sugars, such as fried or sweet foods. Lifestyle  Do not drink alcohol. Do not use any products that contain nicotine or tobacco. These products include cigarettes, chewing tobacco, and vaping devices, such as e-cigarettes. If you need help quitting, ask your provider. Get 7-9 hours of sleep  each night, or the amount  recommended by your provider. Find ways to manage stress, such as meditation, deep breathing, or yoga. Try to exercise regularly. This can help lessen how bad and how often your migraines occur. General instructions Keep a journal to find out what triggers your migraines, so you can avoid those things. For example, write down: What you eat and drink. How much sleep you get. Any change to your diet or medicines. If you have a migraine headache: Avoid things that make your symptoms worse, such as bright lights. Lie down in a dark, quiet room. Do not drive or use machinery. Ask your provider what activities are safe for you while you have symptoms. Keep all follow-up visits. Your provider will monitor your symptoms and recommend any further treatment. Where to find more information Coalition for Headache and Migraine Patients (CHAMP): headachemigraine.org American Migraine Foundation: americanmigrainefoundation.org National Headache Foundation: headaches.org Contact a health care provider if: You have symptoms that are different or worse than your usual migraine headache symptoms. You have more than 15 days of headaches in one month. Get help right away if: Your migraine headache becomes severe or lasts more than 72 hours. You have a fever or stiff neck. You have vision loss. Your muscles feel weak or like you cannot control them. You lose your balance often or have trouble walking. You faint. You have a seizure. This information is not intended to replace advice given to you by your health care provider. Make sure you discuss any questions you have with your health care provider. Document Revised: 10/14/2021 Document Reviewed: 10/14/2021 Elsevier Patient Education  2024 [employer].

## 2023-04-21 ENCOUNTER — Encounter: Payer: Self-pay | Admitting: Family Medicine

## 2023-04-22 ENCOUNTER — Encounter: Payer: Self-pay | Admitting: Family Medicine

## 2023-04-22 NOTE — Progress Notes (Signed)
Subjective:    Patient ID: Valerie Ruiz, female    DOB: 03/16/1975, 48 y.o.   MRN: [id number]  Chief Complaint  Patient presents with   Headache    Past 3 month feels like a knife in the eye    HPI Discussed the use of AI scribe software for clinical note transcription with the patient, who gave verbal consent to proceed.  History of Present Illness   The patient, with a history of headaches, presents with a new complaint of intense, stabbing eye pain that has been occurring for approximately three months. The pain is usually located in the right eye, but can also occur in the left. The pain is described as constant and can last for an entire day. The patient reports that the pain can sometimes be alleviated with Advil or Goodies Powder, but this is not consistent. The patient denies any visual changes such as flashing lights, floaters, blurriness, or double vision. There was one instance where the patient experienced a full right-sided headache with pain behind the ear, but this has only occurred once. The patient denies any recent trauma, lifestyle changes, or dietary changes that could be contributing to the symptoms.        Past Medical History:  Diagnosis Date   Acute bronchitis 08/28/2015   Allergic rhinitis    Anemia 05/08/2016   Asthma    Chronic headaches    Depression    Glaucoma    History of ovulatory pain 09/10/2014   LLQ pain 09/10/2014   Preventative health care 12/24/2014   Preventative health care 12/24/2014    Past Surgical History:  Procedure Laterality Date   ANKLE SURGERY     Broken ankle    Family History  Problem Relation Age of Onset   Hyperthyroidism Mother    Obesity Mother    Gout Father    Stroke Father    COPD Maternal Aunt    Heart disease Maternal Grandmother 35       MI   Stroke Maternal Grandfather    Alcohol abuse Maternal Grandfather    Cancer Paternal Grandmother        metastatic breast with mets to bone   Breast cancer  Paternal Grandmother    Depression Paternal Grandmother    Colon cancer Neg Hx    Colon polyps Neg Hx    Esophageal cancer Neg Hx    Rectal cancer Neg Hx    Stomach cancer Neg Hx     Social History   Socioeconomic History   Marital status: Married    Spouse name: Not on file   Number of children: Not on file   Years of education: Not on file   Highest education level: Master's degree (e.g., MA, MS, MEng, MEd, MSW, MBA)  Occupational History   Not on file  Tobacco Use   Smoking status: Never   Smokeless tobacco: Never  Substance and Sexual Activity   Alcohol use: No    Alcohol/week: 3.0 standard drinks of alcohol    Types: 3 Standard drinks or equivalent per week   Drug use: No   Sexual activity: Yes    Partners: Female    Birth control/protection: None    Comment: lives with spouse, works as Designer, industrial/product support at [employer]   Other Topics Concern   Not on file  Social History Narrative   Not on file   Social Drivers of Health   Financial Resource Strain: Low Risk  (04/18/2023)  Overall Financial Resource Strain (CARDIA)    Difficulty of Paying Living Expenses: Not hard at all  Food Insecurity: No Food Insecurity (04/18/2023)   Hunger Vital Sign    Worried About Running Out of Food in the Last Year: Never true    Ran Out of Food in the Last Year: Never true  Transportation Needs: No Transportation Needs (04/18/2023)   PRAPARE - Administrator, Civil Service (Medical): No    Lack of Transportation (Non-Medical): No  Physical Activity: Unknown (04/18/2023)   Exercise Vital Sign    Days of Exercise per Week: 0 days    Minutes of Exercise per Session: Not on file  Stress: No Stress Concern Present (04/18/2023)   [employer] of Occupational Health - Occupational Stress Questionnaire    Feeling of Stress : Only a little  Social Connections: Moderately Isolated (04/18/2023)   Social Connection and Isolation Panel [NHANES]     Frequency of Communication with Friends and Family: Once a week    Frequency of Social Gatherings with Friends and Family: Once a week    Attends Religious Services: Never    Database administrator or Organizations: Yes    Attends Banker Meetings: 1 to 4 times per year    Marital Status: Married  Catering manager Violence: Unknown (06/06/2021)   Received from [employer], [facility]   HITS    Physically Hurt: Not on file    Insult or Talk Down To: Not on file    Threaten Physical Harm: Not on file    Scream or Curse: Not on file    Outpatient Medications Prior to Visit  Medication Sig Dispense Refill   amphetamine-dextroamphetamine (ADDERALL) 20 MG tablet      lamoTRIgine (LAMICTAL) 200 MG tablet Take 1 tablet (200 mg total) by mouth at bedtime.     lurasidone (LATUDA) 20 MG TABS tablet Take 20 mg by mouth at bedtime.     metoprolol tartrate (LOPRESSOR) 25 MG tablet TAKE ONE-HALF TABLET BY MOUTH TWICE A DAY 90 tablet 1   sertraline (ZOLOFT) 50 MG tablet TAKE ONE TABLET BY MOUTH ONE TIME DAILY 90 tablet 1   No facility-administered medications prior to visit.    No Known Allergies  Review of Systems  Constitutional:  Negative for fever and malaise/fatigue.  HENT:  Negative for congestion.   Eyes:  Positive for pain. Negative for blurred vision.  Respiratory:  Negative for shortness of breath.   Cardiovascular:  Negative for chest pain, palpitations and leg swelling.  Gastrointestinal:  Negative for abdominal pain, blood in stool and nausea.  Genitourinary:  Negative for dysuria and frequency.  Musculoskeletal:  Negative for falls.  Skin:  Negative for rash.  Neurological:  Positive for headaches. Negative for dizziness and loss of consciousness.  Endo/Heme/Allergies:  Negative for environmental allergies.  Psychiatric/Behavioral:  Negative for depression. The patient is not nervous/anxious.        Objective:    Physical Exam Constitutional:       General: She is not in acute distress.    Appearance: Normal appearance. She is well-developed. She is not toxic-appearing.  HENT:     Head: Normocephalic and atraumatic.     Right Ear: External ear normal.     Left Ear: External ear normal.     Nose: Nose normal.  Eyes:     General:        Right eye: No discharge.        Left  eye: No discharge.     Conjunctiva/sclera: Conjunctivae normal.  Neck:     Thyroid: No thyromegaly.  Cardiovascular:     Rate and Rhythm: Normal rate and regular rhythm.     Heart sounds: Normal heart sounds. No murmur heard. Pulmonary:     Effort: Pulmonary effort is normal. No respiratory distress.     Breath sounds: Normal breath sounds.  Abdominal:     General: Bowel sounds are normal.     Palpations: Abdomen is soft.     Tenderness: There is no abdominal tenderness. There is no guarding.  Musculoskeletal:        General: Normal range of motion.     Cervical back: Neck supple.  Lymphadenopathy:     Cervical: No cervical adenopathy.  Skin:    General: Skin is warm and dry.  Neurological:     Mental Status: She is alert and oriented to person, place, and time.  Psychiatric:        Mood and Affect: Mood normal.        Behavior: Behavior normal.        Thought Content: Thought content normal.        Judgment: Judgment normal.     BP 110/74 (BP Location: Left Arm, Patient Position: Sitting, Cuff Size: Normal)   Pulse 83   Temp 97.8 F (36.6 C) (Oral)   Resp 18   Ht 5\' 2"  (1.575 m)   Wt 139 lb 3.2 oz (63.1 kg)   SpO2 98%   BMI 25.46 kg/m  Wt Readings from Last 3 Encounters:  04/20/23 139 lb 3.2 oz (63.1 kg)  07/07/22 150 lb 3.2 oz (68.1 kg)  06/24/22 160 lb (72.6 kg)    Diabetic Foot Exam - Simple   No data filed    Lab Results  Component Value Date   WBC 5.6 04/20/2023   HGB 13.6 04/20/2023   HCT 39.9 04/20/2023   PLT 274.0 04/20/2023   GLUCOSE 89 04/20/2023   CHOL 176 04/20/2023   TRIG 72.0 04/20/2023   HDL 55.40 04/20/2023    LDLCALC 106 (H) 04/20/2023   ALT 13 04/20/2023   AST 14 04/20/2023   NA 137 04/20/2023   K 3.8 04/20/2023   CL 100 04/20/2023   CREATININE 0.53 04/20/2023   BUN 10 04/20/2023   CO2 28 04/20/2023   TSH 1.57 04/20/2023    Lab Results  Component Value Date   TSH 1.57 04/20/2023   Lab Results  Component Value Date   WBC 5.6 04/20/2023   HGB 13.6 04/20/2023   HCT 39.9 04/20/2023   MCV 94.4 04/20/2023   PLT 274.0 04/20/2023   Lab Results  Component Value Date   NA 137 04/20/2023   K 3.8 04/20/2023   CO2 28 04/20/2023   GLUCOSE 89 04/20/2023   BUN 10 04/20/2023   CREATININE 0.53 04/20/2023   BILITOT 0.9 04/20/2023   ALKPHOS 58 04/20/2023   AST 14 04/20/2023   ALT 13 04/20/2023   PROT 6.9 04/20/2023   ALBUMIN 4.4 04/20/2023   CALCIUM 9.1 04/20/2023   GFR 111.11 04/20/2023   Lab Results  Component Value Date   CHOL 176 04/20/2023   Lab Results  Component Value Date   HDL 55.40 04/20/2023   Lab Results  Component Value Date   LDLCALC 106 (H) 04/20/2023   Lab Results  Component Value Date   TRIG 72.0 04/20/2023   Lab Results  Component Value Date   CHOLHDL 3 04/20/2023  No results found for: "HGBA1C"     Assessment & Plan:  Bipolar 2 disorder (HCC) Assessment & Plan: Multiple meds in past.    Hyperlipidemia, unspecified hyperlipidemia type Assessment & Plan: Encourage heart healthy diet such as MIND or DASH diet, increase exercise, avoid trans fats, simple carbohydrates and processed foods, consider a krill or fish or flaxseed oil cap daily.   Orders: -     Comprehensive metabolic panel -     Lipid panel  Attention deficit hyperactivity disorder (ADHD), unspecified ADHD type Assessment & Plan: Has tolerated meds in past   Nonintractable headache, unspecified chronicity pattern, unspecified headache type -     Ambulatory referral to Neurology -     CBC with Differential/Platelet -     Comprehensive metabolic panel -     Sedimentation  rate  Pain in eye, unspecified laterality -     Ambulatory referral to Ophthalmology -     TSH  Other orders -     SUMAtriptan Succinate; Take 1 tablet (50 mg total) by mouth every 2 (two) hours as needed for migraine (2 tabs in 24 hours). May repeat in 2 hours if headache persists or recurs.  Dispense: 10 tablet; Refill: 1    Assessment and Plan    Ocular Pain New onset, constant, stabbing pain in the eye, predominantly right-sided, occurring approximately every three weeks for the past three months. No associated visual changes. One episode of right-sided headache with ear pain. No nausea, vomiting, or light/noise sensitivity. No recent trauma or lifestyle changes. Possible ocular migraine. -Refer to ophthalmology for detailed eye examination. -Refer to neurology for further evaluation. -Prescribe Imitrex 50mg , 1-2 tablets as needed for pain, with a maximum of 2 tablets in 24 hours. -Advise patient to maintain hydration and regular protein intake. -Order labs including CBC, thyroid function tests, glucose, and sedimentation rate to rule out systemic causes. -Consider imaging if symptoms escalate or if recommended by specialists.  General Health Maintenance: -Continue current medications Lamictal, Latuda, Sertraline, and Adderall. -Ensure up-to-date with vaccinations:Influenza and COVID-19 vaccines confirmed. -Follow-up physical scheduled for June.         Danise Edge, MD

## 2023-04-24 ENCOUNTER — Encounter: Payer: Self-pay | Admitting: Neurology

## 2023-04-30 ENCOUNTER — Ambulatory Visit: Payer: 59 | Admitting: Family Medicine

## 2023-05-15 ENCOUNTER — Other Ambulatory Visit: Payer: Self-pay | Admitting: Family Medicine

## 2023-06-30 ENCOUNTER — Other Ambulatory Visit: Payer: Self-pay | Admitting: Family Medicine

## 2023-07-09 ENCOUNTER — Encounter (HOSPITAL_COMMUNITY): Payer: Self-pay

## 2023-07-09 ENCOUNTER — Other Ambulatory Visit: Payer: Self-pay | Admitting: Medical Genetics

## 2023-07-14 ENCOUNTER — Other Ambulatory Visit: Payer: Self-pay | Admitting: Family Medicine

## 2023-07-14 DIAGNOSIS — Z1231 Encounter for screening mammogram for malignant neoplasm of breast: Secondary | ICD-10-CM

## 2023-07-16 ENCOUNTER — Encounter

## 2023-07-16 DIAGNOSIS — Z1231 Encounter for screening mammogram for malignant neoplasm of breast: Secondary | ICD-10-CM

## 2023-08-03 NOTE — Progress Notes (Signed)
 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  No red flags to suggest other ominous secondary etiology.  Continue current management with Dr. Armenta Landau.  If headaches worsen, she is encouraged to follow up.     Subjective:  Valerie Ruiz is a 48 year old right-handed female with Bipolar 2 disorder, ADHD, HLD, chronic pain and glaucoma who presents for headaches.  History supplemented by referring provider's note.  C-spine X-ray from 2011 personally reviewed.  She has longstanding history of headaches.    She began experiencing a new headache around October 2024.  Describes a 7.5-8/10 stabbing unilateral retro-orbital pain (usually right sided) radiating from the back of the head.  Has neck pain but not specifically associated with or aggravated with the headache.  Not associated with autonomic symptoms (lacrimation, conjunctival injection, ptosis), nausea, photophobia, phonophobia, visual changes or dizziness.  One time, headache involved the entire right side of her head with associated pain behind her ear.  1.5 to 2 days.  They were occurring 1 to 2 times a month for 6 months and then resolved.    Her typical headaches have been ongoing off and on for many years.  Thought to be cervicogenic.  They occur as a 6/10 pressure at the base of her skull with bilateral neck pain and aggravated by bending over.  No associated visual disturbance, dizziness, nausea, vomiting or upper extremity pain, numbness or weakness.  They occur daily to every other day for 2 week span and then several weeks without one.    Due to increased headaches, she started seeing Sports Medicine at [facility] in October 2024 where she has been treated with OMT.  She still  sees her, Dr. Armenta Landau.  Her habitual posterior headaches occurring now every 3 to 4 weeks.  09/18/2021  X-RAY C-SPINE:   1. No acute fracture. 2. Straightening of the usual cervical lordosis. 3. Intervertebral disc space heights are maintained.  11/09/2009 X-RAY C-SPINE:  Negative two-view examination  Past NSAIDS/analgesics:  Excedrin, ibuprofen Past abortive triptans:  none Past abortive ergotamine:  none Past muscle relaxants:  Flexeril  10mg  Past anti-emetic:  none Past antihypertensive medications:  none Past antidepressant medications:  amitriptyline  10mg  at bedtime (years ago), fluoxetine  Past anticonvulsant medications:  topiramate  25mg  at bedtime (years ago) Past anti-CGRP:  none Past vitamins/Herbal/Supplements:  none Past antihistamines/decongestants:  Flonase Other past therapies:  none  Current NSAIDS/analgesics:  none Current triptans:  sumatriptan  50mg  (has not used it yet) Current ergotamine:  none Current anti-emetic:  none Current muscle relaxants:  none Current Antihypertensive medications:  metoprolol  tartrate 12.5mg  BID  Current Antidepressant medications:  sertraline  50mg  daily Current Anticonvulsant medications:  lamotrigine 200mg  at bedtime Current anti-CGRP:  none Current Vitamins/Herbal/Supplements:  none Current Antihistamines/Decongestants:  none Other therapy:  OMT Birth control:  none Other medications:  Latuda, Adderall   Caffeine:  4 to 5 cups of coffee daily. No soda or energy drink Diet:  48 oz water daily.  Vegetarian.   Exercise:  no Depression:  stable; Anxiety:  stable Sleep hygiene:  good.    No history of head trauma/concussion or neck trauma Family history of headache:  mother (migraines)      PAST MEDICAL HISTORY: Past Medical History:  Diagnosis Date   Acute bronchitis  08/28/2015   Allergic rhinitis    Anemia 05/08/2016   Asthma    Chronic headaches    Depression    Glaucoma    History of ovulatory pain 09/10/2014   LLQ  pain 09/10/2014   Preventative health care 12/24/2014   Preventative health care 12/24/2014    PAST SURGICAL HISTORY: Past Surgical History:  Procedure Laterality Date   ANKLE SURGERY     Broken ankle    MEDICATIONS: Current Outpatient Medications on File Prior to Visit  Medication Sig Dispense Refill   amphetamine-dextroamphetamine (ADDERALL) 20 MG tablet      lamoTRIgine (LAMICTAL) 200 MG tablet Take 1 tablet (200 mg total) by mouth at bedtime.     lurasidone (LATUDA) 20 MG TABS tablet Take 20 mg by mouth at bedtime.     metoprolol  tartrate (LOPRESSOR ) 25 MG tablet TAKE ONE-HALF TABLET BY MOUTH TWICE A DAY 90 tablet 1   sertraline  (ZOLOFT ) 50 MG tablet TAKE ONE TABLET BY MOUTH ONE TIME DAILY 90 tablet 1   SUMAtriptan  (IMITREX ) 50 MG tablet Take 1 tablet (50 mg total) by mouth every 2 (two) hours as needed for migraine (2 tabs in 24 hours). May repeat in 2 hours if headache persists or recurs. 10 tablet 1   No current facility-administered medications on file prior to visit.    ALLERGIES: No Known Allergies  FAMILY HISTORY: Family History  Problem Relation Age of Onset   Hyperthyroidism Mother    Obesity Mother    Gout Father    Stroke Father    COPD Maternal Aunt    Heart disease Maternal Grandmother 64       MI   Stroke Maternal Grandfather    Alcohol abuse Maternal Grandfather    Cancer Paternal Grandmother        metastatic breast with mets to bone   Breast cancer Paternal Grandmother    Depression Paternal Grandmother    Colon cancer Neg Hx    Colon polyps Neg Hx    Esophageal cancer Neg Hx    Rectal cancer Neg Hx    Stomach cancer Neg Hx     Objective:  Blood pressure 110/70, pulse 79, height 5\' 2"  (1.575 m), weight 140 lb (63.5 kg), SpO2 99%. General: No acute distress.  Patient appears well-groomed.   Head:  Normocephalic/atraumatic Eyes:  fundi examined but not visualized Neck: supple, no paraspinal tenderness, full range of motion Heart: regular  rate and rhythm Neurological Exam: Mental status: alert and oriented to person, place, and time, speech fluent and not dysarthric, language intact. Cranial nerves: CN I: not tested CN II: pupils equal, round and reactive to light, visual fields intact CN III, IV, VI:  full range of motion, no nystagmus, no ptosis CN V: facial sensation intact. CN VII: upper and lower face symmetric CN VIII: hearing intact CN IX, X: gag intact, uvula midline CN XI: sternocleidomastoid and trapezius muscles intact CN XII: tongue midline Bulk & Tone: normal, no fasciculations. Motor:  muscle strength 5/5 throughout Sensation:  Pinprick and vibratory sensation intact. Deep Tendon Reflexes:  2+ throughout,  toes downgoing.   Finger to nose testing:  Without dysmetria.   Gait:  Normal station and stride.  Romberg negative.    Thank you for allowing me to take part in the care of this patient.  Janne Members, DO  CC: Randie Bustle, MD

## 2023-08-04 ENCOUNTER — Encounter: Payer: Self-pay | Admitting: Neurology

## 2023-08-04 ENCOUNTER — Ambulatory Visit (INDEPENDENT_AMBULATORY_CARE_PROVIDER_SITE_OTHER): Admitting: Neurology

## 2023-08-04 VITALS — BP 110/70 | HR 79 | Ht 62.0 in | Wt 140.0 lb

## 2023-08-04 DIAGNOSIS — G4486 Cervicogenic headache: Secondary | ICD-10-CM

## 2023-08-05 ENCOUNTER — Ambulatory Visit

## 2023-08-05 DIAGNOSIS — Z1231 Encounter for screening mammogram for malignant neoplasm of breast: Secondary | ICD-10-CM | POA: Diagnosis not present

## 2023-08-19 NOTE — Assessment & Plan Note (Signed)
 Encourage heart healthy diet such as MIND or DASH diet, increase exercise, avoid trans fats, simple carbohydrates and processed foods, consider a krill or fish or flaxseed oil cap daily.

## 2023-08-19 NOTE — Assessment & Plan Note (Signed)
 Tubular adenoma 2024 repeat colonoscopy in 2031

## 2023-08-19 NOTE — Assessment & Plan Note (Signed)
 Working with psychiatry, stable on current meds

## 2023-08-19 NOTE — Assessment & Plan Note (Signed)
Tolerating Adderall

## 2023-08-19 NOTE — Assessment & Plan Note (Addendum)
 Patient encouraged to maintain heart healthy diet, regular exercise, adequate sleep. Consider daily probiotics. Take medications as prescribed. Labs ordered and reviewed Given and reviewed copy of ACP documents from [employer] and encouraged to complete and return  Mgm 08/2023 repeat annually Pap 01/2021 repeat in 2026 or 27 Colonoscopy 06/2022 repeat in 2031

## 2023-08-20 ENCOUNTER — Encounter: Payer: Self-pay | Admitting: Family Medicine

## 2023-08-20 ENCOUNTER — Ambulatory Visit (INDEPENDENT_AMBULATORY_CARE_PROVIDER_SITE_OTHER): Admitting: Family Medicine

## 2023-08-20 VITALS — BP 118/74 | HR 79 | Resp 16 | Ht 62.0 in | Wt 136.8 lb

## 2023-08-20 DIAGNOSIS — F909 Attention-deficit hyperactivity disorder, unspecified type: Secondary | ICD-10-CM | POA: Diagnosis not present

## 2023-08-20 DIAGNOSIS — Z860100 Personal history of colon polyps, unspecified: Secondary | ICD-10-CM | POA: Diagnosis not present

## 2023-08-20 DIAGNOSIS — F3181 Bipolar II disorder: Secondary | ICD-10-CM

## 2023-08-20 DIAGNOSIS — Z0000 Encounter for general adult medical examination without abnormal findings: Secondary | ICD-10-CM

## 2023-08-20 DIAGNOSIS — E785 Hyperlipidemia, unspecified: Secondary | ICD-10-CM

## 2023-08-20 NOTE — Patient Instructions (Signed)
 Preventive Care 16-47 Years Old, Female  Preventive care refers to lifestyle choices and visits with your health care provider that can promote health and wellness. Preventive care visits are also called wellness exams.  What can I expect for my preventive care visit?  Counseling  Your health care provider may ask you questions about your:  Medical history, including:  Past medical problems.  Family medical history.  Pregnancy history.  Current health, including:  Menstrual cycle.  Method of birth control.  Emotional well-being.  Home life and relationship well-being.  Sexual activity and sexual health.  Lifestyle, including:  Alcohol, nicotine or tobacco, and drug use.  Access to firearms.  Diet, exercise, and sleep habits.  Work and work Astronomer.  Sunscreen use.  Safety issues such as seatbelt and bike helmet use.  Physical exam  Your health care provider will check your:  Height and weight. These may be used to calculate your BMI (body mass index). BMI is a measurement that tells if you are at a healthy weight.  Waist circumference. This measures the distance around your waistline. This measurement also tells if you are at a healthy weight and may help predict your risk of certain diseases, such as type 2 diabetes and high blood pressure.  Heart rate and blood pressure.  Body temperature.  Skin for abnormal spots.  What immunizations do I need?    Vaccines are usually given at various ages, according to a schedule. Your health care provider will recommend vaccines for you based on your age, medical history, and lifestyle or other factors, such as travel or where you work.  What tests do I need?  Screening  Your health care provider may recommend screening tests for certain conditions. This may include:  Lipid and cholesterol levels.  Diabetes screening. This is done by checking your blood sugar (glucose) after you have not eaten for a while (fasting).  Pelvic exam and Pap test.  Hepatitis B test.  Hepatitis C  test.  HIV (human immunodeficiency virus) test.  STI (sexually transmitted infection) testing, if you are at risk.  Lung cancer screening.  Colorectal cancer screening.  Mammogram. Talk with your health care provider about when you should start having regular mammograms. This may depend on whether you have a family history of breast cancer.  BRCA-related cancer screening. This may be done if you have a family history of breast, ovarian, tubal, or peritoneal cancers.  Bone density scan. This is done to screen for osteoporosis.  Talk with your health care provider about your test results, treatment options, and if necessary, the need for more tests.  Follow these instructions at home:  Eating and drinking    Eat a diet that includes fresh fruits and vegetables, whole grains, lean protein, and low-fat dairy products.  Take vitamin and mineral supplements as recommended by your health care provider.  Do not drink alcohol if:  Your health care provider tells you not to drink.  You are pregnant, may be pregnant, or are planning to become pregnant.  If you drink alcohol:  Limit how much you have to 0-1 drink a day.  Know how much alcohol is in your drink. In the [place] one drink equals one 12 oz bottle of beer (355 mL), one 5 oz glass of wine (148 mL), or one 1 oz glass of hard liquor (44 mL).  Lifestyle  Brush your teeth every morning and night with fluoride toothpaste. Floss one time each day.  Exercise for at least  30 minutes 5 or more days each week.  Do not use any products that contain nicotine or tobacco. These products include cigarettes, chewing tobacco, and vaping devices, such as e-cigarettes. If you need help quitting, ask your health care provider.  Do not use drugs.  If you are sexually active, practice safe sex. Use a condom or other form of protection to prevent STIs.  If you do not wish to become pregnant, use a form of birth control. If you plan to become pregnant, see your health care provider for a  prepregnancy visit.  Take aspirin only as told by your health care provider. Make sure that you understand how much to take and what form to take. Work with your health care provider to find out whether it is safe and beneficial for you to take aspirin daily.  Find healthy ways to manage stress, such as:  Meditation, yoga, or listening to music.  Journaling.  Talking to a trusted person.  Spending time with friends and family.  Minimize exposure to UV radiation to reduce your risk of skin cancer.  Safety  Always wear your seat belt while driving or riding in a vehicle.  Do not drive:  If you have been drinking alcohol. Do not ride with someone who has been drinking.  When you are tired or distracted.  While texting.  If you have been using any mind-altering substances or drugs.  Wear a helmet and other protective equipment during sports activities.  If you have firearms in your house, make sure you follow all gun safety procedures.  Seek help if you have been physically or sexually abused.  What's next?  Visit your health care provider once a year for an annual wellness visit.  Ask your health care provider how often you should have your eyes and teeth checked.  Stay up to date on all vaccines.  This information is not intended to replace advice given to you by your health care provider. Make sure you discuss any questions you have with your health care provider.  Document Revised: 08/15/2020 Document Reviewed: 08/15/2020  Elsevier Patient Education  2024 [employer].

## 2023-08-20 NOTE — Progress Notes (Signed)
 Subjective:    Patient ID: Valerie Ruiz, female    DOB: 1975-09-07, 48 y.o.   MRN: [id number]  Chief Complaint  Patient presents with   Annual Exam    Patient presents today for physical exam.   Quality Metric Gaps    Hep C & HIV screening    HPI Discussed the use of AI scribe software for clinical note transcription with the patient, who gave verbal consent to proceed.  History of Present Illness Valerie Ruiz is a 48 year old female who presents for an annual physical exam.  No significant changes in health status since the last visit. No recent emergency room visits, chest pains, or new bowel problems. Up to date with colonoscopy, last performed in 2024, with the next one scheduled for 2031, assuming no new symptoms such as blood in stool or changes in bowel habits occur. Last Pap smear was in December 2022, with the next due by December 2027, given negative results for abnormal cells and HPV. Recently completed a mammogram with no concerns about lumps or bumps.  Has not yet completed advanced directives. No changes in family history, with no new diagnoses or significant events among family members.  Tetanus vaccination is up to date, with the last dose in 2017. Maintains a healthy diet with adequate intake of protein, fruits, vegetables, and water, and is mindful of carbohydrate consumption. No current travel plans for the summer, preferring to travel in the fall when it is cooler and less crowded.  Continues regular dental visits and is under the care of Dr. Gavin Kast for behavioral health. No issues with swallowing, cough, or skin concerns and sees a dermatologist regularly.    Past Medical History:  Diagnosis Date   Acute bronchitis 08/28/2015   Allergic rhinitis    Anemia 05/08/2016   Asthma    Chronic headaches    Depression    Glaucoma    History of ovulatory pain 09/10/2014   LLQ pain 09/10/2014   Preventative health care 12/24/2014   Preventative health care  12/24/2014    Past Surgical History:  Procedure Laterality Date   ANKLE SURGERY     Broken ankle    Family History  Problem Relation Age of Onset   Migraines Mother    Hyperthyroidism Mother    Obesity Mother    Gout Father    Stroke Father    COPD Maternal Aunt    Heart disease Maternal Grandmother 39       MI   Stroke Maternal Grandfather    Alcohol abuse Maternal Grandfather    Cancer Paternal Grandmother        metastatic breast with mets to bone   Breast cancer Paternal Grandmother    Depression Paternal Grandmother    Colon cancer Neg Hx    Colon polyps Neg Hx    Esophageal cancer Neg Hx    Rectal cancer Neg Hx    Stomach cancer Neg Hx     Social History   Socioeconomic History   Marital status: Married    Spouse name: Not on file   Number of children: Not on file   Years of education: Not on file   Highest education level: Master's degree (e.g., MA, MS, MEng, MEd, MSW, MBA)  Occupational History   Not on file  Tobacco Use   Smoking status: Never   Smokeless tobacco: Never  Vaping Use   Vaping status: Never Used  Substance and Sexual Activity   Alcohol use:  No    Alcohol/week: 3.0 standard drinks of alcohol    Types: 3 Standard drinks or equivalent per week   Drug use: No   Sexual activity: Yes    Partners: Female    Birth control/protection: None    Comment: lives with spouse, works as Designer, industrial/product support at [employer]   Other Topics Concern   Not on file  Social History Narrative   Right handed   Social Drivers of Health   Financial Resource Strain: Low Risk  (08/19/2023)   Overall Financial Resource Strain (CARDIA)    Difficulty of Paying Living Expenses: Not hard at all  Food Insecurity: No Food Insecurity (08/19/2023)   Hunger Vital Sign    Worried About Running Out of Food in the Last Year: Never true    Ran Out of Food in the Last Year: Never true  Transportation Needs: No Transportation Needs (08/19/2023)    PRAPARE - Administrator, Civil Service (Medical): No    Lack of Transportation (Non-Medical): No  Physical Activity: Insufficiently Active (08/19/2023)   Exercise Vital Sign    Days of Exercise per Week: 2 days    Minutes of Exercise per Session: 10 min  Stress: No Stress Concern Present (08/19/2023)   [employer] of Occupational Health - Occupational Stress Questionnaire    Feeling of Stress: Only a little  Social Connections: Moderately Integrated (08/19/2023)   Social Connection and Isolation Panel    Frequency of Communication with Friends and Family: More than three times a week    Frequency of Social Gatherings with Friends and Family: Once a week    Attends Religious Services: Never    Database administrator or Organizations: Yes    Attends Engineer, structural: More than 4 times per year    Marital Status: Married  Catering manager Violence: Unknown (06/06/2021)   Received from [facility]   HITS    Physically Hurt: Not on file    Insult or Talk Down To: Not on file    Threaten Physical Harm: Not on file    Scream or Curse: Not on file    Outpatient Medications Prior to Visit  Medication Sig Dispense Refill   amphetamine-dextroamphetamine (ADDERALL) 20 MG tablet Take 20 mg by mouth 2 (two) times daily.     lamoTRIgine (LAMICTAL) 200 MG tablet Take 1 tablet (200 mg total) by mouth at bedtime.     lurasidone (LATUDA) 20 MG TABS tablet Take 20 mg by mouth at bedtime.     metoprolol  tartrate (LOPRESSOR ) 25 MG tablet TAKE ONE-HALF TABLET BY MOUTH TWICE A DAY 90 tablet 1   sertraline  (ZOLOFT ) 50 MG tablet TAKE ONE TABLET BY MOUTH ONE TIME DAILY 90 tablet 1   SUMAtriptan  (IMITREX ) 50 MG tablet Take 1 tablet (50 mg total) by mouth every 2 (two) hours as needed for migraine (2 tabs in 24 hours). May repeat in 2 hours if headache persists or recurs. 10 tablet 1   No facility-administered medications prior to visit.    No Known Allergies  Review of  Systems  Constitutional:  Negative for chills, fever and malaise/fatigue.  HENT:  Negative for congestion and hearing loss.   Eyes:  Negative for discharge.  Respiratory:  Negative for cough, sputum production and shortness of breath.   Cardiovascular:  Negative for chest pain, palpitations and leg swelling.  Gastrointestinal:  Negative for abdominal pain, blood in stool, constipation, diarrhea, heartburn, nausea and vomiting.  Genitourinary:  Negative for dysuria, frequency, hematuria and urgency.  Musculoskeletal:  Negative for back pain, falls and myalgias.  Skin:  Negative for rash.  Neurological:  Negative for dizziness, sensory change, loss of consciousness, weakness and headaches.  Endo/Heme/Allergies:  Negative for environmental allergies. Does not bruise/bleed easily.  Psychiatric/Behavioral:  Negative for depression and suicidal ideas. The patient is not nervous/anxious and does not have insomnia.        Objective:    Physical Exam Constitutional:      General: She is not in acute distress.    Appearance: Normal appearance. She is not diaphoretic.  HENT:     Head: Normocephalic and atraumatic.     Right Ear: Tympanic membrane, ear canal and external ear normal.     Left Ear: Tympanic membrane, ear canal and external ear normal.     Nose: Nose normal.     Mouth/Throat:     Mouth: Mucous membranes are moist.     Pharynx: Oropharynx is clear. No oropharyngeal exudate.   Eyes:     General: No scleral icterus.       Right eye: No discharge.        Left eye: No discharge.     Conjunctiva/sclera: Conjunctivae normal.     Pupils: Pupils are equal, round, and reactive to light.   Neck:     Thyroid : No thyromegaly.   Cardiovascular:     Rate and Rhythm: Normal rate and regular rhythm.     Heart sounds: Normal heart sounds. No murmur heard. Pulmonary:     Effort: Pulmonary effort is normal. No respiratory distress.     Breath sounds: Normal breath sounds. No wheezing or  rales.  Abdominal:     General: Bowel sounds are normal. There is no distension.     Palpations: Abdomen is soft. There is no mass.     Tenderness: There is no abdominal tenderness.   Musculoskeletal:        General: No tenderness. Normal range of motion.     Cervical back: Normal range of motion and neck supple.  Lymphadenopathy:     Cervical: No cervical adenopathy.   Skin:    General: Skin is warm and dry.     Findings: No rash.   Neurological:     General: No focal deficit present.     Mental Status: She is alert and oriented to person, place, and time.     Cranial Nerves: No cranial nerve deficit.     Coordination: Coordination normal.     Deep Tendon Reflexes: Reflexes are normal and symmetric. Reflexes normal.   Psychiatric:        Mood and Affect: Mood normal.        Behavior: Behavior normal.        Thought Content: Thought content normal.        Judgment: Judgment normal.     BP 118/74   Pulse 79   Resp 16   Ht 5' 2 (1.575 m)   Wt 136 lb 12.8 oz (62.1 kg)   LMP 07/25/2023 (Exact Date)   SpO2 97%   BMI 25.02 kg/m  Wt Readings from Last 3 Encounters:  08/20/23 136 lb 12.8 oz (62.1 kg)  08/04/23 140 lb (63.5 kg)  04/20/23 139 lb 3.2 oz (63.1 kg)    Diabetic Foot Exam - Simple   No data filed    Lab Results  Component Value Date   WBC 5.6 04/20/2023   HGB 13.6 04/20/2023   HCT  39.9 04/20/2023   PLT 274.0 04/20/2023   GLUCOSE 89 04/20/2023   CHOL 176 04/20/2023   TRIG 72.0 04/20/2023   HDL 55.40 04/20/2023   LDLCALC 106 (H) 04/20/2023   ALT 13 04/20/2023   AST 14 04/20/2023   NA 137 04/20/2023   K 3.8 04/20/2023   CL 100 04/20/2023   CREATININE 0.53 04/20/2023   BUN 10 04/20/2023   CO2 28 04/20/2023   TSH 1.57 04/20/2023    Lab Results  Component Value Date   TSH 1.57 04/20/2023   Lab Results  Component Value Date   WBC 5.6 04/20/2023   HGB 13.6 04/20/2023   HCT 39.9 04/20/2023   MCV 94.4 04/20/2023   PLT 274.0 04/20/2023   Lab  Results  Component Value Date   NA 137 04/20/2023   K 3.8 04/20/2023   CO2 28 04/20/2023   GLUCOSE 89 04/20/2023   BUN 10 04/20/2023   CREATININE 0.53 04/20/2023   BILITOT 0.9 04/20/2023   ALKPHOS 58 04/20/2023   AST 14 04/20/2023   ALT 13 04/20/2023   PROT 6.9 04/20/2023   ALBUMIN 4.4 04/20/2023   CALCIUM 9.1 04/20/2023   GFR 111.11 04/20/2023   Lab Results  Component Value Date   CHOL 176 04/20/2023   Lab Results  Component Value Date   HDL 55.40 04/20/2023   Lab Results  Component Value Date   LDLCALC 106 (H) 04/20/2023   Lab Results  Component Value Date   TRIG 72.0 04/20/2023   Lab Results  Component Value Date   CHOLHDL 3 04/20/2023   No results found for: HGBA1C     Assessment & Plan:  Attention deficit hyperactivity disorder (ADHD), unspecified ADHD type Assessment & Plan: Tolerating Adderall   Bipolar 2 disorder (HCC) Assessment & Plan: Working with psychiatry, stable on current meds   Hyperlipidemia, unspecified hyperlipidemia type Assessment & Plan: Encourage heart healthy diet such as MIND or DASH diet, increase exercise, avoid trans fats, simple carbohydrates and processed foods, consider a krill or fish or flaxseed oil cap daily.    History of colon polyps Assessment & Plan: Tubular adenoma 2024 repeat colonoscopy in 2031   Preventative health care Assessment & Plan: Patient encouraged to maintain heart healthy diet, regular exercise, adequate sleep. Consider daily probiotics. Take medications as prescribed. Labs ordered and reviewed Given and reviewed copy of ACP documents from [facility] Secretary of State and encouraged to complete and return  Mgm 08/2023 repeat annually Pap 01/2021 repeat in 2026 or 27 Colonoscopy 06/2022 repeat in 2031     Assessment and Plan Assessment & Plan General Health Maintenance Routine screenings and health maintenance discussed. Colonoscopy due 2031, earlier if symptoms arise. Pap smear due December 2027.  Mammogram completed, no concerns. Tetanus booster due 2027 or sooner if needed. Emphasized hydration, balanced diet, exercise, and healthy lifestyle. Discussed Hepatitis C and annual HIV screening. Addressed risks of new supplements and need for monitoring. - Schedule colonoscopy for 2031. - Schedule Pap smear by December 2027. - Administer tetanus booster in 2027 or sooner if injury occurs. - Encourage balanced diet with whole grains, proteins, and vegetables. - Encourage regular exercise and hydration. - Offer Hepatitis C screening. - Offer annual HIV screening. - Monitor liver and kidney function if new supplements are started.  Goals of Care Discussed advanced directives, healthcare power of attorney, and living will. Emphasized clear directives to avoid unwanted aggressive care. Provided information on completing and submitting directives. Discussed preferences on life-sustaining treatments. - Provide advanced  directives packet. - Encourage completion and submission of advanced directives.  Follow-up Routine follow-up in one year unless health changes. Emphasized contacting provider if concerns arise. - Schedule routine follow-up in one year.     Randie Bustle, MD

## 2023-08-27 ENCOUNTER — Encounter: Payer: 59 | Admitting: Family Medicine

## 2023-09-01 ENCOUNTER — Ambulatory Visit: Payer: 59 | Admitting: Neurology

## 2023-09-22 ENCOUNTER — Ambulatory Visit: Payer: Self-pay | Admitting: Urgent Care

## 2023-09-22 ENCOUNTER — Ambulatory Visit

## 2023-09-22 ENCOUNTER — Encounter: Payer: Self-pay | Admitting: Urgent Care

## 2023-09-22 ENCOUNTER — Ambulatory Visit (INDEPENDENT_AMBULATORY_CARE_PROVIDER_SITE_OTHER): Admitting: Urgent Care

## 2023-09-22 VITALS — BP 108/72 | HR 79 | Resp 20 | Ht 62.0 in | Wt 138.8 lb

## 2023-09-22 DIAGNOSIS — R233 Spontaneous ecchymoses: Secondary | ICD-10-CM | POA: Diagnosis not present

## 2023-09-22 NOTE — Patient Instructions (Signed)
 We drew labs today and obtained an xray to determine the cause of bruising to your foot. We will communicate results via mychart.

## 2023-09-22 NOTE — Progress Notes (Signed)
 Established Patient Office Visit  Subjective:  Patient ID: Valerie Ruiz, female    DOB: 1976-03-03  Age: 48 y.o. MRN: [id number]  Chief Complaint  Patient presents with   Foot Problem    Pt right foot is bruised swollen and numb, and tingles intermittely no pain. Happened about a month ago as well. Doesn't recall an injury    HPI  Discussed the use of AI scribe software for clinical note transcription with the patient, who gave verbal consent to proceed.  History of Present Illness   Valerie Ruiz is a 48 year old female who presents with recurrent tingling and numbness in the right foot.  She experiences tingling and numbness in the right foot, specifically affecting the 5th toe and the area above it. This sensation first appeared about a month ago, resolved within a week, and reappeared yesterday. The sensation is described as 'weird' and is not associated with pain. No recent trauma to the foot is reported, and she has not hit it on anything.  The sensation is accompanied by a visible change on the foot, initially thought to be a bruise. However, the discoloration improved significantly by the following day, leading her to doubt it was a bruise. She is concerned about potential circulation issues.  Her past medical history includes the use of several medications: Adderall, Lamictal, Latuda, Lopressor  (metoprolol ), Zoloft , and sumatriptan . She does not routinely use aspirin, ibuprofen, or Goody Powder, although she takes them occasionally. She works at [employer] job, which involves some walking, and she typically wears casual shoes, not high heels.  No pain, trauma, or cold sensation in the affected area. She confirms normal walking ability and no history of fractures. She does not smoke and has not engaged in activities like dancing recently that might have contributed to the symptoms.       Patient Active Problem List   Diagnosis Date Noted   Family history of breast cancer  07/07/2022   History of colon polyps 07/05/2022   Hyperlipidemia 07/04/2021   Hair loss 12/26/2019   Urinary tract infection 12/26/2019   Vaginal candida 12/26/2019   Perimenopause 12/26/2019   Attention deficit disorder (ADD) 09/06/2017   Bipolar 2 disorder (HCC) 09/06/2017   Anemia 05/08/2016   Leukopenia 11/23/2015   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   Preventative health care 12/24/2014   History of ovulatory pain 09/10/2014   Sacral dysfunction 01/30/2014   Nonallopathic lesion of cervical region 12/02/2013   Nonallopathic lesion of thoracic region 12/02/2013   Nonallopathic lesion of lumbar region 12/02/2013   Cervicogenic headache 11/28/2013   PALPITATIONS, OCCASIONAL 10/06/2008   SEBACEOUS CYST, BREAST 04/25/2008   Past Medical History:  Diagnosis Date   Acute bronchitis 08/28/2015   Allergic rhinitis    Anemia 05/08/2016   Asthma    Chronic headaches    Depression    Glaucoma    History of ovulatory pain 09/10/2014   LLQ pain 09/10/2014   Preventative health care 12/24/2014   Preventative health care 12/24/2014   Past Surgical History:  Procedure Laterality Date   ANKLE SURGERY     Broken ankle   Social History   Tobacco Use   Smoking status: Never   Smokeless tobacco: Never  Vaping Use   Vaping status: Never Used  Substance Use Topics   Alcohol use: No    Alcohol/week: 3.0 standard drinks of alcohol    Types: 3 Standard drinks or equivalent per week   Drug use:  No      ROS: as noted in HPI  Objective:     BP 108/72 (BP Location: Left Arm, Patient Position: Sitting, Cuff Size: Normal)   Pulse 79   Resp 20   Ht 5' 2 (1.575 m)   Wt 138 lb 12 oz (62.9 kg)   SpO2 98%   BMI 25.38 kg/m  BP Readings from Last 3 Encounters:  09/22/23 108/72  08/20/23 118/74  08/04/23 110/70   Wt Readings from Last 3 Encounters:  09/22/23 138 lb 12 oz (62.9 kg)  08/20/23 136 lb 12.8 oz (62.1 kg)  08/04/23 140 lb (63.5 kg)      Physical  Exam Vitals and nursing note reviewed.  Constitutional:      General: She is not in acute distress.    Appearance: Normal appearance. She is normal weight. She is not ill-appearing, toxic-appearing or diaphoretic.  HENT:     Head: Normocephalic.  Cardiovascular:     Rate and Rhythm: Normal rate.     Pulses: Normal pulses.  Pulmonary:     Effort: Pulmonary effort is normal. No respiratory distress.  Musculoskeletal:        General: No swelling or tenderness. Normal range of motion.     Right lower leg: No edema.     Left lower leg: No edema.  Skin:    General: Skin is warm.     Capillary Refill: Capillary refill takes less than 2 seconds.     Coloration: Skin is not jaundiced.     Findings: Bruising (significant ecchymosis noted to 5th digit; see photos) present. No erythema or rash.  Neurological:     General: No focal deficit present.     Mental Status: She is alert and oriented to person, place, and time.     Sensory: No sensory deficit.     Motor: No weakness.     Gait: Gait normal.  Psychiatric:        Mood and Affect: Mood normal.        Behavior: Behavior normal.          No results found for any visits on 09/22/23.  Last CBC Lab Results  Component Value Date   WBC 5.6 04/20/2023   HGB 13.6 04/20/2023   HCT 39.9 04/20/2023   MCV 94.4 04/20/2023   MCH 33.3 (H) 12/26/2019   RDW 12.4 04/20/2023   PLT 274.0 04/20/2023   Last metabolic panel Lab Results  Component Value Date   GLUCOSE 89 04/20/2023   NA 137 04/20/2023   K 3.8 04/20/2023   CL 100 04/20/2023   CO2 28 04/20/2023   BUN 10 04/20/2023   CREATININE 0.53 04/20/2023   GFR 111.11 04/20/2023   CALCIUM 9.1 04/20/2023   PROT 6.9 04/20/2023   ALBUMIN 4.4 04/20/2023   BILITOT 0.9 04/20/2023   ALKPHOS 58 04/20/2023   AST 14 04/20/2023   ALT 13 04/20/2023   Last lipids Lab Results  Component Value Date   CHOL 176 04/20/2023   HDL 55.40 04/20/2023   LDLCALC 106 (H) 04/20/2023   TRIG 72.0  04/20/2023   CHOLHDL 3 04/20/2023   Last hemoglobin A1c No results found for: HGBA1C Last thyroid  functions Lab Results  Component Value Date   TSH 1.57 04/20/2023   Last vitamin D No results found for: MARIEN BOLLS, VD25OH Last vitamin B12 and Folate Lab Results  Component Value Date   VITAMINB12 687 07/29/2011      The 10-year ASCVD risk score (Arnett DK,  et al., 2019) is: 0.5%  Assessment & Plan:  Spontaneous bruising -     CBC with Differential/Platelet -     PT and PTT -     Fibrinogen  -     Antiphospholipid syndrome eval, bld -     ANA w/Reflex -     C-reactive protein -     DG Foot Complete Right; Future  Assessment and Plan    Nontraumatic ecchymosis of the right foot Recurrent ecchymosis on the right foot with tingling and numbness. No trauma or pain. Differential includes vascular issues or autoimmune condition. Foot is warm and dry, good pulses. - Order ANA test for autoimmune screening. - Order CBC for platelet function assessment. - Order x-ray of right foot.  - f/u and tx pending results of workup        No follow-ups on file.   Benton LITTIE Gave, PA

## 2023-09-24 LAB — CBC WITH DIFFERENTIAL/PLATELET
Basophils Absolute: 0 x10E3/uL (ref 0.0–0.2)
Basos: 1 %
EOS (ABSOLUTE): 0.1 x10E3/uL (ref 0.0–0.4)
Eos: 1 %
Hematocrit: 40.2 % (ref 34.0–46.6)
Hemoglobin: 13.5 g/dL (ref 11.1–15.9)
Immature Grans (Abs): 0 x10E3/uL (ref 0.0–0.1)
Immature Granulocytes: 0 %
Lymphocytes Absolute: 1.1 x10E3/uL (ref 0.7–3.1)
Lymphs: 28 %
MCH: 32.4 pg (ref 26.6–33.0)
MCHC: 33.6 g/dL (ref 31.5–35.7)
MCV: 96 fL (ref 79–97)
Monocytes Absolute: 0.4 x10E3/uL (ref 0.1–0.9)
Monocytes: 11 %
Neutrophils Absolute: 2.3 x10E3/uL (ref 1.4–7.0)
Neutrophils: 59 %
Platelets: 265 x10E3/uL (ref 150–450)
RBC: 4.17 x10E6/uL (ref 3.77–5.28)
RDW: 12 % (ref 11.7–15.4)
WBC: 3.9 x10E3/uL (ref 3.4–10.8)

## 2023-09-24 LAB — COAG STUDIES INTERP REPORT

## 2023-09-24 LAB — ANTIPHOSPHOLIPID SYNDROME EVAL, BLD
APTT PPP: 28.9 s (ref 22.9–30.2)
Anticardiolipin IgG: <9 GPL U/mL (ref 0–14)
Anticardiolipin IgM: <9 [MPL'U]/mL (ref 0–12)
Beta-2 Glyco 1 IgM: <9 GPI IgM units (ref 0–32)
Beta-2 Glyco I IgG: <9 GPI IgG units (ref 0–20)
Dilute Viper Venom Time: 36 s (ref 0.0–47.0)
Hexagonal Phase Phospholipid: 2 s (ref 0–11)
INR: 1 (ref 0.9–1.2)
PT: 10.9 s (ref 9.1–12.0)
Thrombin Time: 22 s (ref 0.0–23.0)

## 2023-09-24 LAB — PT AND PTT
INR: 0.9 (ref 0.9–1.2)
Prothrombin Time: 10.3 s (ref 9.1–12.0)
aPTT: 29 s (ref 24–33)

## 2023-09-24 LAB — ANA W/REFLEX: Anti Nuclear Antibody (ANA): NEGATIVE

## 2023-09-24 LAB — C-REACTIVE PROTEIN: CRP: <1 mg/L (ref 0–10)

## 2023-09-24 LAB — FIBRINOGEN: Fibrinogen: 425 mg/dL (ref 193–507)

## 2023-10-03 ENCOUNTER — Ambulatory Visit

## 2023-10-06 ENCOUNTER — Ambulatory Visit
Admission: RE | Admit: 2023-10-06 | Discharge: 2023-10-06 | Disposition: A | Discharge status: Home / Self Care | Source: Ambulatory Visit | Attending: Family Medicine | Admitting: Family Medicine

## 2023-10-06 VITALS — BP 107/74 | HR 103 | Temp 98.7°F | Resp 18

## 2023-10-06 DIAGNOSIS — R21 Rash and other nonspecific skin eruption: Secondary | ICD-10-CM

## 2023-10-06 DIAGNOSIS — L509 Urticaria, unspecified: Secondary | ICD-10-CM

## 2023-10-06 LAB — POCT FASTING CBG KUC MANUAL ENTRY: POCT Glucose (KUC): 95 mg/dL (ref 70–99)

## 2023-10-06 MED ORDER — METHYLPREDNISOLONE ACETATE 80 MG/ML IJ SUSP
80.0000 mg | Freq: Once | INTRAMUSCULAR | Status: AC
  Administered 2023-10-06: 80 mg via INTRAMUSCULAR

## 2023-10-06 MED ORDER — FEXOFENADINE HCL 180 MG PO TABS
180.0000 mg | ORAL_TABLET | Freq: Every day | ORAL | 0 refills | Status: DC
Start: 2023-10-06 — End: 2023-12-10

## 2023-10-06 MED ORDER — METHYLPREDNISOLONE 4 MG PO TBPK: ORAL_TABLET | ORAL | 0 refills | Status: DC

## 2023-10-06 NOTE — ED Notes (Signed)
 PT came outside to the nurses desk to report she is not feeling well post shot. Assisted pt to the table where I helped her sit lay back. Attached patient to mobitor and called for NP. CBG collected and vitals obtained. Pt diaphoretic and feeling out of it. Gave patient soda, cold washcloth to face and crackers. Pt asked us  to call her spouse. Wife informed of situation. Pt symptoms returning to baseline

## 2023-10-06 NOTE — ED Provider Notes (Signed)
 [facility]    CSN: [id number] Arrival date & time: 10/06/23  1508      History   Chief Complaint Chief Complaint  Patient presents with   Rash    HPI Valerie Ruiz is a 49 y.o. female.   HPI 48 year old female presents with sunburn of body 2 days.  PMH significant for depression, asthma, and chronic headaches.  Past Medical History:  Diagnosis Date   Acute bronchitis 08/28/2015   Allergic rhinitis    Anemia 05/08/2016   Asthma    Chronic headaches    Depression    Glaucoma    History of ovulatory pain 09/10/2014   LLQ pain 09/10/2014   Preventative health care 12/24/2014   Preventative health care 12/24/2014    Patient Active Problem List   Diagnosis Date Noted   Family history of breast cancer 07/07/2022   History of colon polyps 07/05/2022   Hyperlipidemia 07/04/2021   Hair loss 12/26/2019   Urinary tract infection 12/26/2019   Vaginal candida 12/26/2019   Perimenopause 12/26/2019   Attention deficit disorder (ADD) 09/06/2017   Bipolar 2 disorder (HCC) 09/06/2017   Anemia 05/08/2016   Leukopenia 11/23/2015   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   Preventative health care 12/24/2014   History of ovulatory pain 09/10/2014   Sacral dysfunction 01/30/2014   Nonallopathic lesion of cervical region 12/02/2013   Nonallopathic lesion of thoracic region 12/02/2013   Nonallopathic lesion of lumbar region 12/02/2013   Cervicogenic headache 11/28/2013   PALPITATIONS, OCCASIONAL 10/06/2008   SEBACEOUS CYST, BREAST 04/25/2008    Past Surgical History:  Procedure Laterality Date   ANKLE SURGERY     Broken ankle    OB History     Gravida  0   Para  0   Term  0   Preterm  0   AB  0   Living  0      SAB  0   IAB  0   Ectopic  0   Multiple  0   Live Births  0            Home Medications    Prior to Admission medications   Medication Sig Start Date End Date Taking? Authorizing Provider   amphetamine-dextroamphetamine (ADDERALL) 20 MG tablet Take 20 mg by mouth 2 (two) times daily. 06/20/22  Yes [provider]  fexofenadine  (ALLEGRA  ALLERGY) 180 MG tablet Take 1 tablet (180 mg total) by mouth daily for 15 days. 10/06/23 10/21/23 Yes Teddy Sharper, FNP  lamoTRIgine (LAMICTAL) 200 MG tablet Take 1 tablet (200 mg total) by mouth at bedtime. 06/14/20  Yes Domenica Harlene LABOR, MD  lurasidone (LATUDA) 20 MG TABS tablet Take 20 mg by mouth at bedtime. 06/06/20  Yes [provider]  methylPREDNISolone  (MEDROL  DOSEPAK) 4 MG TBPK tablet Take as directed. 10/06/23  Yes Teddy Sharper, FNP  metoprolol  tartrate (LOPRESSOR ) 25 MG tablet TAKE ONE-HALF TABLET BY MOUTH TWICE A DAY 07/01/23  Yes Domenica Harlene LABOR, MD    Family History Family History  Problem Relation Age of Onset   Migraines Mother    Hyperthyroidism Mother    Obesity Mother    Gout Father    Stroke Father    COPD Maternal Aunt    Heart disease Maternal Grandmother 25       MI   Stroke Maternal Grandfather    Alcohol abuse Maternal Grandfather    Cancer Paternal Grandmother        metastatic breast  with mets to bone   Breast cancer Paternal Grandmother    Depression Paternal Grandmother    Colon cancer Neg Hx    Colon polyps Neg Hx    Esophageal cancer Neg Hx    Rectal cancer Neg Hx    Stomach cancer Neg Hx     Social History Social History   Tobacco Use   Smoking status: Never   Smokeless tobacco: Never  Vaping Use   Vaping status: Never Used  Substance Use Topics   Alcohol use: No    Alcohol/week: 3.0 standard drinks of alcohol    Types: 3 Standard drinks or equivalent per week   Drug use: No     Allergies   Patient has no known allergies.   Review of Systems Review of Systems  Skin:  Positive for rash.     Physical Exam Triage Vital Signs ED Triage Vitals  Encounter Vitals Group     BP      Girls Systolic BP Percentile      Girls Diastolic BP Percentile      Boys Systolic BP  Percentile      Boys Diastolic BP Percentile      Pulse      Resp      Temp      Temp src      SpO2      Weight      Height      Head Circumference      Peak Flow      Pain Score      Pain Loc      Pain Education      Exclude from Growth Chart    No data found.  Updated Vital Signs BP 107/74 (BP Location: Right Arm)   Pulse (!) 103   Temp 98.7 F (37.1 C) (Oral)   Resp 18   SpO2 99%    Physical Exam Vitals and nursing note reviewed.  Constitutional:      Appearance: Normal appearance. She is normal weight.  HENT:     Head: Normocephalic and atraumatic.     Mouth/Throat:     Mouth: Mucous membranes are moist.     Pharynx: Oropharynx is clear.  Eyes:     Extraocular Movements: Extraocular movements intact.     Conjunctiva/sclera: Conjunctivae normal.     Pupils: Pupils are equal, round, and reactive to light.  Cardiovascular:     Rate and Rhythm: Normal rate and regular rhythm.     Pulses: Normal pulses.     Heart sounds: Normal heart sounds.  Pulmonary:     Effort: Pulmonary effort is normal.     Breath sounds: Normal breath sounds. No wheezing, rhonchi or rales.  Musculoskeletal:        General: Normal range of motion.     Cervical back: Normal range of motion and neck supple.  Skin:    General: Skin is warm and dry.     Comments: Face/upper chest/bilateral lower arms: Erythematous macular papular eruption, pruritic in nature per patient please see images below  Neurological:     General: No focal deficit present.     Mental Status: She is alert and oriented to person, place, and time. Mental status is at baseline.  Psychiatric:        Mood and Affect: Mood normal.        Behavior: Behavior normal.           UC Treatments / Results  Labs (all labs  ordered are listed, but only abnormal results are displayed) Labs Reviewed  POCT FASTING [facility] MANUAL ENTRY    EKG   Radiology No results found.  Procedures Procedures (including critical  care time)  Medications Ordered in UC Medications  methylPREDNISolone  acetate (DEPO-MEDROL ) injection 80 mg (80 mg Intramuscular Given 10/06/23 1556)    Initial Impression / Assessment and Plan / UC Course  I have reviewed the triage vital signs and the nursing notes.  Pertinent labs & imaging results that were available during my care of the patient were reviewed by me and considered in my medical decision making (see chart for details).     MDM: 1.  Rash and nonspecific skin eruption-IM Depo-Medrol  80 mg given once in clinic and prior to discharge, Rx'd Medrol  Dosepak, take as directed; 2.  Urticaria-Rx'd Allegra  180 mg tablet: Take 1 tablet daily x 5 days. Advised patient to take medications as directed with food to completion.  Advised patient take Medrol  Dosepak and Allegra  daily for the next 5 days.  May use Allegra  as needed afterwards for urticaria/hives.  Encouraged increase daily water intake to 64 ounces per day while taking this medication.  Encouraged increase daily water intake to 64 ounces per day while taking these medications.  Advised if symptoms worsen and/or unresolved please follow-up with your PCP or here for further evaluation.  Final Clinical Impressions(s) / UC Diagnoses   Final diagnoses:  Rash and nonspecific skin eruption  Urticaria     Discharge Instructions      Advised patient to take medications as directed with food to completion.  Advised patient take Medrol  Dosepak and Allegra  daily for the next 5 days.  May use Allegra  as needed afterwards for urticaria/hives.  Encouraged increase daily water intake to 64 ounces per day while taking this medication.  Encouraged increase daily water intake to 64 ounces per day while taking these medications.  Advised if symptoms worsen and/or unresolved please follow-up with your PCP or here for further evaluation.     ED Prescriptions     Medication Sig Dispense Auth. Provider   fexofenadine  (ALLEGRA  ALLERGY) 180 MG  tablet Take 1 tablet (180 mg total) by mouth daily for 15 days. 15 tablet Kebra Lowrimore, FNP   methylPREDNISolone  (MEDROL  DOSEPAK) 4 MG TBPK tablet Take as directed. 1 each Teddy Sharper, FNP      PDMP not reviewed this encounter.   Teddy Sharper, FNP 10/06/23 1626

## 2023-10-06 NOTE — Discharge Instructions (Addendum)
 Advised patient to take medications as directed with food to completion.  Advised patient take Medrol  Dosepak and Allegra  daily for the next 5 days.  May use Allegra  as needed afterwards for urticaria/hives.  Encouraged increase daily water intake to 64 ounces per day while taking this medication.  Encouraged increase daily water intake to 64 ounces per day while taking these medications.  Advised if symptoms worsen and/or unresolved please follow-up with your PCP or here for further evaluation.

## 2023-10-06 NOTE — ED Triage Notes (Signed)
 Patient presents to Urgent Care with complaints of possible sunburn/red rash and itching since 2 days ago. Patient reports redness on face, arms and itching. Took Zyrtec last night. Tried cooling lotion on her face. Has taken Advil for pain.

## 2023-10-22 ENCOUNTER — Encounter: Payer: 59 | Admitting: Family Medicine

## 2023-10-26 ENCOUNTER — Ambulatory Visit: Admitting: Family Medicine

## 2023-12-06 NOTE — Assessment & Plan Note (Signed)
 Tolerating Adderall

## 2023-12-06 NOTE — Assessment & Plan Note (Signed)
 Working with psychiatry, stable on current meds

## 2023-12-06 NOTE — Assessment & Plan Note (Signed)
 Encourage heart healthy diet such as MIND or DASH diet, increase exercise, avoid trans fats, simple carbohydrates and processed foods, consider a krill or fish or flaxseed oil cap daily.

## 2023-12-10 ENCOUNTER — Encounter: Payer: Self-pay | Admitting: Family Medicine

## 2023-12-10 ENCOUNTER — Ambulatory Visit: Admitting: Family Medicine

## 2023-12-10 VITALS — BP 104/72 | HR 75 | Ht 62.0 in | Wt 141.8 lb

## 2023-12-10 DIAGNOSIS — E785 Hyperlipidemia, unspecified: Secondary | ICD-10-CM

## 2023-12-10 DIAGNOSIS — I998 Other disorder of circulatory system: Secondary | ICD-10-CM | POA: Diagnosis not present

## 2023-12-10 DIAGNOSIS — F3181 Bipolar II disorder: Secondary | ICD-10-CM | POA: Diagnosis not present

## 2023-12-10 DIAGNOSIS — F909 Attention-deficit hyperactivity disorder, unspecified type: Secondary | ICD-10-CM | POA: Diagnosis not present

## 2023-12-10 NOTE — Patient Instructions (Signed)
 Not saying you have this just for your reading enjoyment  Raynaud's Phenomenon  Raynaud's phenomenon is a condition that affects the blood vessels (arteries) that carry blood to the fingers and toes. The arteries that supply blood to the ears, lips, nipples, or the tip of the nose might also be affected. Raynaud's phenomenon causes the arteries to become narrow temporarily (spasm). As a result, the flow of blood to the affected areas is temporarily decreased. This usually occurs in response to cold temperatures or stress. During an attack, the skin in the affected areas turns white, then blue, and finally red. A person may also feel tingling or numbness in those areas. Attacks usually last for only a brief period, and then the blood flow to the area returns to normal. In most cases, Raynaud's phenomenon does not cause serious health problems. What are the causes? In many cases, the cause of this condition is not known. The condition may occur on its own (primary Raynaud's phenomenon) or may be associated with other diseases or factors (secondary Raynaud's phenomenon). Possible causes may include: Diseases or medical conditions that damage the arteries. Injuries and repetitive actions that hurt the hands or feet. Being exposed to certain chemicals. Taking medicines that narrow the arteries. Other medical conditions, such as lupus, scleroderma, rheumatoid arthritis, thyroid  problems, blood disorders, Sjogren syndrome, or atherosclerosis. What increases the risk? The following factors may make you more likely to develop this condition: Being 8-32 years old. Being female. Having a family history of Raynaud's phenomenon. Living in a cold climate. Smoking. What are the signs or symptoms? Symptoms of this condition usually occur when you are exposed to cold temperatures or when you have emotional stress. The symptoms may last for a few minutes or up to several hours. They usually affect your fingers  but may also affect your toes, nipples, lips, ears, or the tip of your nose. Symptoms may include: Changes in skin color. The skin in the affected areas will turn pale or white. The skin may then change from white to bluish to red as normal blood flow returns to the area. Numbness, tingling, or pain in the affected areas. In severe cases, symptoms may include: Skin sores. Tissues decaying and dying (gangrene). How is this diagnosed? This condition may be diagnosed based on: Your symptoms and medical history. A physical exam. During the exam, you may be asked to put your hands in cold water to check for a reaction to cold temperature. Tests, such as: Blood tests to check for other diseases or conditions. A test to check the movement of blood through your arteries and veins (vascular ultrasound). A test in which the skin at the base of your fingernail is examined under a microscope (nailfold capillaroscopy). How is this treated? During an episode, you can take actions to help symptoms go away faster. Options include moving your arms around in a windmill pattern, warming your fingers under warm water, or placing your fingers in a warm body fold, such as your armpit. Long-term treatment for this condition often involves making lifestyle changes and taking steps to control your exposure to cold temperature. For more severe cases, medicine (calcium channel blockers) may be used to improve blood circulation. Follow these instructions at home: Avoiding cold temperatures Take these steps to avoid exposure to cold: If possible, stay indoors during cold weather. When you go outside during cold weather, dress in layers and wear mittens, a hat, a scarf, and warm footwear. Wear mittens or gloves when handling  ice or frozen food. Use holders for glasses or cans containing cold drinks. Let warm water run for a while before taking a shower or bath. Warm up the car before driving in cold  weather. Lifestyle If possible, avoid stressful and emotional situations. Try to find ways to manage your stress, such as: Exercise. Yoga. Meditation. Biofeedback. Do not use any products that contain nicotine or tobacco. These products include cigarettes, chewing tobacco, and vaping devices, such as e-cigarettes. If you need help quitting, ask your health care provider. Avoid secondhand smoke. Limit your use of caffeine. Switch to decaffeinated coffee, tea, and soda. Avoid chocolate. Avoid vibrating tools and machinery. General instructions Protect your hands and feet from injuries, cuts, or bruises. Avoid wearing tight rings or wristbands. Wear loose fitting socks and comfortable, roomy shoes. Take over-the-counter and prescription medicines only as told by your health care provider. Where to find support Raynaud's Association: www.raynauds.org Where to find more information [employer] of Arthritis and Musculoskeletal and Skin Diseases: www.niams.http://www.myers.net/ Contact a health care provider if: Your discomfort becomes worse despite lifestyle changes. You develop sores on your fingers or toes that do not heal. You have breaks in the skin on your fingers or toes. You have a fever. You have pain or swelling in your joints. You have a rash. Your symptoms occur on only one side of your body. Get help right away if: Your fingers or toes turn black. You have severe pain in the affected areas. These symptoms may represent a serious problem that is an emergency. Do not wait to see if the symptoms will go away. Get medical help right away. Call your local emergency services (911 in the [place] Do not drive yourself to the hospital. Summary Raynaud's phenomenon is a condition that affects the arteries that carry blood to the fingers, toes, ears, lips, nipples, or the tip of the nose. In many cases, the cause of this condition is not known. Symptoms of this condition include changes in  skin color along with numbness and tingling in the affected area. Treatment for this condition includes lifestyle changes and reducing exposure to cold temperatures. Medicines may be used for severe cases of the condition. Contact your health care provider if your condition worsens despite treatment. This information is not intended to replace advice given to you by your health care provider. Make sure you discuss any questions you have with your health care provider. Document Revised: 04/24/2020 Document Reviewed: 04/24/2020 Elsevier Patient Education  2024 [employer].

## 2023-12-10 NOTE — Progress Notes (Signed)
 Subjective:    Patient ID: Valerie Ruiz, female    DOB: 06-18-1975, 48 y.o.   MRN: [id number]  Chief Complaint  Patient presents with   Circulatory Problem    I have this weird thing going on with my foot R foot onset 07/2023  It has come and gone several times since, my pinky toe will turn white and come completely numb     HPI Discussed the use of AI scribe software for clinical note transcription with the patient, who gave verbal consent to proceed.  History of Present Illness Valerie Ruiz is a 48 year old female who presents with recurrent episodes of discoloration and pain in her right fifth toe.  She experiences episodes of discoloration and pain in her right fifth toe, which began in May and occur approximately once a month. Initially, these episodes lasted about a day to a day and a half, but have recently extended to about three days. During these episodes, the toe turns white and purple, accompanied by numbness and pain, which she describes as 'numb, but it's painful if that makes sense.'  In July, she sought medical attention during an episode, and an x-ray along with extensive blood work were performed, all of which returned normal results. Despite these findings, the episodes have persisted monthly without any identifiable triggers such as specific shoes or activities. She has not noticed any correlation with trauma or compression from shoes, and the episodes occur even when she has been inactive and not wearing shoes.  No family history of blood clotting disorders, although her father has had strokes, which were age-related. She has not tried any specific treatments like ice, heat, or elevation, and reports no significant swelling or other symptoms in the rest of the foot. She has not started smoking and has no history of smoking.    Past Medical History:  Diagnosis Date   Acute bronchitis 08/28/2015   Allergic rhinitis    Anemia 05/08/2016   Asthma    Chronic  headaches    Depression    Glaucoma    History of ovulatory pain 09/10/2014   LLQ pain 09/10/2014   Preventative health care 12/24/2014   Preventative health care 12/24/2014    Past Surgical History:  Procedure Laterality Date   ANKLE SURGERY     Broken ankle    Family History  Problem Relation Age of Onset   Migraines Mother    Hyperthyroidism Mother    Obesity Mother    Gout Father    Stroke Father    COPD Maternal Aunt    Heart disease Maternal Grandmother 12       MI   Stroke Maternal Grandfather    Alcohol abuse Maternal Grandfather    Cancer Paternal Grandmother        metastatic breast with mets to bone   Breast cancer Paternal Grandmother    Depression Paternal Grandmother    Colon cancer Neg Hx    Colon polyps Neg Hx    Esophageal cancer Neg Hx    Rectal cancer Neg Hx    Stomach cancer Neg Hx     Social History   Socioeconomic History   Marital status: Married    Spouse name: Not on file   Number of children: Not on file   Years of education: Not on file   Highest education level: Master's degree (e.g., MA, MS, MEng, MEd, MSW, MBA)  Occupational History   Not on file  Tobacco Use  Smoking status: Never   Smokeless tobacco: Never  Vaping Use   Vaping status: Never Used  Substance and Sexual Activity   Alcohol use: No    Alcohol/week: 3.0 standard drinks of alcohol    Types: 3 Standard drinks or equivalent per week   Drug use: No   Sexual activity: Yes    Partners: Female    Birth control/protection: None    Comment: lives with spouse, works as Designer, industrial/product support at [employer]   Other Topics Concern   Not on file  Social History Narrative   Right handed   Social Drivers of Health   Financial Resource Strain: Low Risk  (08/19/2023)   Overall Financial Resource Strain (CARDIA)    Difficulty of Paying Living Expenses: Not hard at all  Food Insecurity: Low Risk  (11/09/2023)   Received from [facility]   Hunger  Vital Sign    Within the past 12 months, you worried that your food would run out before you got money to buy more: Never true    Within the past 12 months, the food you bought just didn't last and you didn't have money to get more. : Never true  Transportation Needs: No Transportation Needs (11/09/2023)   Received from [facility]    In the past 12 months, has lack of reliable transportation kept you from medical appointments, meetings, work or from getting things needed for daily living? : No  Physical Activity: Insufficiently Active (08/19/2023)   Exercise Vital Sign    Days of Exercise per Week: 2 days    Minutes of Exercise per Session: 10 min  Stress: No Stress Concern Present (08/19/2023)   [employer] of Occupational Health - Occupational Stress Questionnaire    Feeling of Stress: Only a little  Social Connections: Moderately Integrated (08/19/2023)   Social Connection and Isolation Panel    Frequency of Communication with Friends and Family: More than three times a week    Frequency of Social Gatherings with Friends and Family: Once a week    Attends Religious Services: Never    Database administrator or Organizations: Yes    Attends Engineer, structural: More than 4 times per year    Marital Status: Married  Catering manager Violence: Unknown (06/06/2021)   Received from [facility]   HITS    Physically Hurt: Not on file    Insult or Talk Down To: Not on file    Threaten Physical Harm: Not on file    Scream or Curse: Not on file    Outpatient Medications Prior to Visit  Medication Sig Dispense Refill   amphetamine-dextroamphetamine (ADDERALL) 20 MG tablet Take 20 mg by mouth 2 (two) times daily.     CAPLYTA 10.5 MG capsule Take 10.5 mg by mouth daily.     lamoTRIgine (LAMICTAL) 200 MG tablet Take 1 tablet (200 mg total) by mouth at bedtime.     lurasidone (LATUDA) 20 MG TABS tablet Take 20 mg by mouth at bedtime.     metoprolol  tartrate  (LOPRESSOR ) 25 MG tablet TAKE ONE-HALF TABLET BY MOUTH TWICE A DAY 90 tablet 1   fexofenadine  (ALLEGRA  ALLERGY) 180 MG tablet Take 1 tablet (180 mg total) by mouth daily for 15 days. 15 tablet 0   methylPREDNISolone  (MEDROL  DOSEPAK) 4 MG TBPK tablet Take as directed. 1 each 0   No facility-administered medications prior to visit.    No Known Allergies  Review of Systems  Constitutional:  Negative for fever and malaise/fatigue.  HENT:  Negative for congestion.   Eyes:  Negative for blurred vision.  Respiratory:  Negative for shortness of breath.   Cardiovascular:  Negative for chest pain, palpitations and leg swelling.  Gastrointestinal:  Negative for abdominal pain, blood in stool and nausea.  Genitourinary:  Negative for dysuria and frequency.  Musculoskeletal:  Positive for joint pain. Negative for falls.  Skin:  Negative for rash.  Neurological:  Negative for dizziness, loss of consciousness and headaches.  Endo/Heme/Allergies:  Negative for environmental allergies. Bruises/bleeds easily.  Psychiatric/Behavioral:  Negative for depression. The patient is not nervous/anxious.        Objective:    Physical Exam Constitutional:      General: She is not in acute distress.    Appearance: Normal appearance. She is well-developed. She is not toxic-appearing.  HENT:     Head: Normocephalic and atraumatic.     Right Ear: External ear normal.     Left Ear: External ear normal.     Nose: Nose normal.  Eyes:     General:        Right eye: No discharge.        Left eye: No discharge.     Conjunctiva/sclera: Conjunctivae normal.  Neck:     Thyroid : No thyromegaly.  Cardiovascular:     Rate and Rhythm: Normal rate and regular rhythm.     Heart sounds: Normal heart sounds. No murmur heard. Pulmonary:     Effort: Pulmonary effort is normal. No respiratory distress.     Breath sounds: Normal breath sounds.  Abdominal:     General: Bowel sounds are normal.     Palpations: Abdomen is  soft.     Tenderness: There is no abdominal tenderness. There is no guarding.  Musculoskeletal:        General: No swelling, tenderness, deformity or signs of injury. Normal range of motion.     Cervical back: Neck supple.     Right lower leg: No edema.     Left lower leg: No edema.  Lymphadenopathy:     Cervical: No cervical adenopathy.  Skin:    General: Skin is warm and dry.  Neurological:     Mental Status: She is alert and oriented to person, place, and time.  Psychiatric:        Mood and Affect: Mood normal.        Behavior: Behavior normal.        Thought Content: Thought content normal.        Judgment: Judgment normal.     BP 104/72 (BP Location: Left Arm, Patient Position: Sitting, Cuff Size: Normal)   Pulse 75   Ht 5' 2 (1.575 m)   Wt 141 lb 12.8 oz (64.3 kg)   LMP 11/19/2023 (Within Days)   SpO2 99%   BMI 25.94 kg/m  Wt Readings from Last 3 Encounters:  12/10/23 141 lb 12.8 oz (64.3 kg)  09/22/23 138 lb 12 oz (62.9 kg)  08/20/23 136 lb 12.8 oz (62.1 kg)    Diabetic Foot Exam - Simple   No data filed    Lab Results  Component Value Date   WBC 3.9 09/22/2023   HGB 13.5 09/22/2023   HCT 40.2 09/22/2023   PLT 265 09/22/2023   GLUCOSE 89 04/20/2023   CHOL 176 04/20/2023   TRIG 72.0 04/20/2023   HDL 55.40 04/20/2023   LDLCALC 106 (H) 04/20/2023   ALT 13 04/20/2023   AST 14 04/20/2023  NA 137 04/20/2023   K 3.8 04/20/2023   CL 100 04/20/2023   CREATININE 0.53 04/20/2023   BUN 10 04/20/2023   CO2 28 04/20/2023   TSH 1.57 04/20/2023   INR 0.9 09/22/2023   INR 1.0 09/22/2023    Lab Results  Component Value Date   TSH 1.57 04/20/2023   Lab Results  Component Value Date   WBC 3.9 09/22/2023   HGB 13.5 09/22/2023   HCT 40.2 09/22/2023   MCV 96 09/22/2023   PLT 265 09/22/2023   Lab Results  Component Value Date   NA 137 04/20/2023   K 3.8 04/20/2023   CO2 28 04/20/2023   GLUCOSE 89 04/20/2023   BUN 10 04/20/2023   CREATININE 0.53  04/20/2023   BILITOT 0.9 04/20/2023   ALKPHOS 58 04/20/2023   AST 14 04/20/2023   ALT 13 04/20/2023   PROT 6.9 04/20/2023   ALBUMIN 4.4 04/20/2023   CALCIUM 9.1 04/20/2023   GFR 111.11 04/20/2023   Lab Results  Component Value Date   CHOL 176 04/20/2023   Lab Results  Component Value Date   HDL 55.40 04/20/2023   Lab Results  Component Value Date   LDLCALC 106 (H) 04/20/2023   Lab Results  Component Value Date   TRIG 72.0 04/20/2023   Lab Results  Component Value Date   CHOLHDL 3 04/20/2023   No results found for: HGBA1C     Assessment & Plan:  Attention deficit hyperactivity disorder (ADHD), unspecified ADHD type Assessment & Plan: Tolerating Adderall   Bipolar 2 disorder (HCC) Assessment & Plan: Working with psychiatry, stable on current meds   Hyperlipidemia, unspecified hyperlipidemia type Assessment & Plan: Encourage heart healthy diet such as MIND or DASH diet, increase exercise, avoid trans fats, simple carbohydrates and processed foods, consider a krill or fish or flaxseed oil cap daily.    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 Episodes occur monthly without identifiable triggers. Previous evaluations, including x-rays and blood work, were unremarkable. The condition is atypical for Raynaud's phenomenon due to prolonged duration and lack of cold exposure correlation. Differential diagnosis includes vascular abnormalities and musculoskeletal issues. No family history of vascular or clotting disorders, except age-related strokes in her father. - Refer to vascular specialist for evaluation of blood supply to the right fifth toe. - Advise bringing pictures of the episodes to the vascular  consultation. - Consider referral to sports medicine if vascular evaluation is inconclusive and symptoms persist. - Schedule follow-up appointment after the holidays to reassess condition and review any new findings.  General Health Maintenance Discussed the importance of vaccinations, including influenza and COVID-19, especially in preventing pneumonia. She is aware of the need for a flu shot and has an appointment scheduled. Current guidelines for COVID-19 vaccination were explained, noting challenges in obtaining it for those without specific risk factors. - Ensure flu vaccination is completed as scheduled. - Discuss COVID-19 vaccination options, offering to write a prescription if she decides to pursue it.  Recording duration: 20 minutes     Harlene Horton, MD

## 2023-12-18 ENCOUNTER — Other Ambulatory Visit: Payer: Self-pay

## 2023-12-18 DIAGNOSIS — M79676 Pain in unspecified toe(s): Secondary | ICD-10-CM

## 2023-12-20 ENCOUNTER — Other Ambulatory Visit: Payer: Self-pay | Admitting: Family Medicine

## 2024-01-13 ENCOUNTER — Ambulatory Visit (HOSPITAL_COMMUNITY)
Admission: RE | Admit: 2024-01-13 | Discharge: 2024-01-13 | Disposition: A | Discharge status: Home / Self Care | Source: Ambulatory Visit | Attending: Vascular Surgery | Admitting: Vascular Surgery

## 2024-01-13 ENCOUNTER — Encounter: Payer: Self-pay | Admitting: Vascular Surgery

## 2024-01-13 ENCOUNTER — Ambulatory Visit: Admitting: Vascular Surgery

## 2024-01-13 VITALS — BP 106/70 | HR 75 | Temp 97.8°F | Ht 62.0 in | Wt 143.0 lb

## 2024-01-13 DIAGNOSIS — I7300 Raynaud's syndrome without gangrene: Secondary | ICD-10-CM

## 2024-01-13 DIAGNOSIS — M79676 Pain in unspecified toe(s): Secondary | ICD-10-CM | POA: Diagnosis not present

## 2024-01-13 LAB — VAS US ABI WITH/WO TBI
Left ABI: 1.13
Right ABI: 1.13

## 2024-01-13 NOTE — Progress Notes (Signed)
 Patient ID: Valerie Ruiz, female   DOB: 11/06/1975, 48 y.o.   MRN: [id number]  Reason for Consult: New Patient (Initial Visit)   Referred by Valerie Harlene LABOR, MD  Subjective:     HPI:  Valerie Ruiz is a 48 y.o. female without significant medical history.  She does have a history of an ankle surgery on the right side 15 years ago but no issues afterwards and walks without limitations.  She now has had several episodes of right small toe dark discoloration which does have associated numbness but no pain.  She has no tissue loss or ulceration.  Her toes have been chronically discolored purple and red but this does not cause her pain either.  No previous history to the right foot or affected toe.  She does not notice any change with temperature.  She has no personal or family history of rheumatologic disorder.  Past Medical History:  Diagnosis Date   Acute bronchitis 08/28/2015   Allergic rhinitis    Anemia 05/08/2016   Asthma    Chronic headaches    Depression    Glaucoma    History of ovulatory pain 09/10/2014   LLQ pain 09/10/2014   Preventative health care 12/24/2014   Preventative health care 12/24/2014   Family History  Problem Relation Age of Onset   Migraines Mother    Hyperthyroidism Mother    Obesity Mother    Gout Father    Stroke Father    COPD Maternal Aunt    Heart disease Maternal Grandmother 17       MI   Stroke Maternal Grandfather    Alcohol abuse Maternal Grandfather    Cancer Paternal Grandmother        metastatic breast with mets to bone   Breast cancer Paternal Grandmother    Depression Paternal Grandmother    Colon cancer Neg Hx    Colon polyps Neg Hx    Esophageal cancer Neg Hx    Rectal cancer Neg Hx    Stomach cancer Neg Hx    Past Surgical History:  Procedure Laterality Date   ANKLE SURGERY     Broken ankle    Short Social History:  Social History   Tobacco Use   Smoking status: Never   Smokeless tobacco: Never  Substance Use  Topics   Alcohol use: No    Alcohol/week: 3.0 standard drinks of alcohol    Types: 3 Standard drinks or equivalent per week    No Known Allergies  Current Outpatient Medications  Medication Sig Dispense Refill   amphetamine-dextroamphetamine (ADDERALL) 20 MG tablet Take 20 mg by mouth 2 (two) times daily.     CAPLYTA 10.5 MG capsule Take 10.5 mg by mouth daily.     lamoTRIgine (LAMICTAL) 200 MG tablet Take 1 tablet (200 mg total) by mouth at bedtime.     lurasidone (LATUDA) 20 MG TABS tablet Take 20 mg by mouth at bedtime.     metoprolol  tartrate (LOPRESSOR ) 25 MG tablet TAKE ONE-HALF TABLET BY MOUTH TWICE A DAY 90 tablet 1   sertraline  (ZOLOFT ) 50 MG tablet Take 1 tablet (50 mg total) by mouth daily. 90 tablet 1   No current facility-administered medications for this visit.    Review of Systems  Constitutional:  Constitutional negative. HENT: HENT negative.  Eyes: Eyes negative.  Respiratory: Respiratory negative.  Cardiovascular: Cardiovascular negative.  GI: Gastrointestinal negative.  Skin:       Discolored right small toe Neurological: Neurological negative. Hematologic:  Hematologic/lymphatic negative.  Psychiatric: Psychiatric negative.        Objective:  Objective   Vitals:   01/13/24 0924  BP: 106/70  Pulse: 75  Temp: 97.8 F (36.6 C)  SpO2: 99%  Weight: 143 lb (64.9 kg)  Height: 5' 2 (1.575 m)   Body mass index is 26.16 kg/m.  Physical Exam HENT:     Head: Normocephalic.     Nose: Nose normal.  Eyes:     Pupils: Pupils are equal, round, and reactive to light.  Cardiovascular:     Rate and Rhythm: Normal rate.     Pulses:          Dorsalis pedis pulses are 2+ on the right side and 2+ on the left side.       Posterior tibial pulses are 2+ on the right side and 2+ on the left side.  Pulmonary:     Effort: Pulmonary effort is normal.  Abdominal:     General: Abdomen is flat.  Musculoskeletal:        General: Normal range of motion.      Cervical back: Normal range of motion.     Right lower leg: No edema.     Left lower leg: No edema.  Skin:    General: Skin is warm.     Capillary Refill: Capillary refill takes less than 2 seconds.  Neurological:     General: No focal deficit present.     Mental Status: She is alert.  Psychiatric:        Mood and Affect: Mood normal.     Data: ABI Findings:  +---------+------------------+-----+---------+--------+  Right   Rt Pressure (mmHg)IndexWaveform Comment   +---------+------------------+-----+---------+--------+  Brachial 101                                       +---------+------------------+-----+---------+--------+  PTA     117               1.12 triphasic          +---------+------------------+-----+---------+--------+  DP      114               1.10 triphasic          +---------+------------------+-----+---------+--------+  Great Toe98                0.94 Normal             +---------+------------------+-----+---------+--------+   +---------+------------------+-----+---------+-------+  Left    Lt Pressure (mmHg)IndexWaveform Comment  +---------+------------------+-----+---------+-------+  Brachial 104                                      +---------+------------------+-----+---------+-------+  PTA     117               1.12 triphasic         +---------+------------------+-----+---------+-------+  DP      114               1.10 triphasic         +---------+------------------+-----+---------+-------+  Great Toe93                0.89 Normal            +---------+------------------+-----+---------+-------+   +-------+-----------+-----------+------------+------------+  ABI/TBIToday's ABIToday's TBIPrevious ABIPrevious TBI  +-------+-----------+-----------+------------+------------+  Right 1.13  0.94                                 +-------+-----------+-----------+------------+------------+   Left  1.13       0.89                                 +-------+-----------+-----------+------------+------------+           Summary:  Right: Resting right ankle-brachial index is within normal range. The  right toe-brachial index is normal.  Toe waveforms of all digits appear WNL.    Left: Resting left ankle-brachial index is within normal range. The left  toe-brachial index is normal.  Toe waveforms of all digits appear WNL.      Assessment/Plan:     48 year old female with history as above of right small toe discoloration which is intermittent in nature not related to cold or hot.  Appears to possibly be a rheumatologic or post injury result but overall does not appear to be causing significant limitation in her life and her blood flow is normal and as such she can see me on an as-needed basis.     Penne Lonni Colorado MD [facility]

## 2024-01-21 ENCOUNTER — Ambulatory Visit: Admitting: Family Medicine

## 2024-02-05 ENCOUNTER — Other Ambulatory Visit: Payer: Self-pay | Admitting: Family Medicine

## 2024-02-22 ENCOUNTER — Other Ambulatory Visit

## 2024-03-22 ENCOUNTER — Other Ambulatory Visit: Payer: Self-pay | Admitting: Medical Genetics

## 2024-03-22 DIAGNOSIS — Z006 Encounter for examination for normal comparison and control in clinical research program: Secondary | ICD-10-CM

## 2024-03-23 ENCOUNTER — Ambulatory Visit: Admitting: Family Medicine

## 2024-03-23 ENCOUNTER — Other Ambulatory Visit (HOSPITAL_COMMUNITY)
Admission: RE | Admit: 2024-03-23 | Discharge: 2024-03-23 | Disposition: A | Source: Ambulatory Visit | Attending: Family Medicine | Admitting: Family Medicine

## 2024-03-23 VITALS — BP 122/66 | HR 94 | Ht 62.0 in | Wt 140.1 lb

## 2024-03-23 DIAGNOSIS — Z01419 Encounter for gynecological examination (general) (routine) without abnormal findings: Secondary | ICD-10-CM | POA: Diagnosis present

## 2024-03-23 DIAGNOSIS — N951 Menopausal and female climacteric states: Secondary | ICD-10-CM

## 2024-03-23 DIAGNOSIS — Z23 Encounter for immunization: Secondary | ICD-10-CM

## 2024-03-23 DIAGNOSIS — Z1239 Encounter for other screening for malignant neoplasm of breast: Secondary | ICD-10-CM

## 2024-03-23 MED ORDER — PROGESTERONE 200 MG PO CAPS: 200.0000 mg | ORAL_CAPSULE | Freq: Every day | ORAL | 3 refills | Status: AC

## 2024-03-23 MED ORDER — ESTRADIOL 0.05 MG/24HR TD PTTW: 1.0000 | MEDICATED_PATCH | TRANSDERMAL | 3 refills | Status: AC

## 2024-03-23 NOTE — Progress Notes (Signed)
 "  ANNUAL EXAM Patient name: Valerie Ruiz MRN [id number]  Date of birth: 04-Feb-1976 Chief Complaint:   Annual Exam  History of Present Illness:   Valerie Ruiz is a 49 y.o.  G0P0000  female  being seen today for a routine annual exam.  Current complaints:  Discussed the use of AI scribe software for clinical note transcription with the patient, who gave verbal consent to proceed.  History of Present Illness Valerie Ruiz is a 49 year old female who presents with symptoms of perimenopause.  She experiences significant sleep disturbances and night sweats, which are the most bothersome symptoms. Her sleep is described as 'very little'.  She has irregular menstrual cycles, with periods of amenorrhea lasting a couple of months followed by regular cycles for a couple of months, making it difficult to predict her menstrual cycle.  She has not conducted much personal research on perimenopause treatments but has heard from friends about the use of hormone patches. She is open to exploring treatment options to manage her symptoms.  No current issues with vaginal dryness, stating it is not bothersome at this point.    Patient's last menstrual period was 03/08/2024 (approximate).    Last pap 2022. Results were: NILM w/ HRHPV negative. H/O abnormal pap: yes  HSIL in 2018, s/p  Last mammogram: 08/2023. Results were: normal. Family h/o breast cancer: no Last colonoscopy: 2024. Results were: 7mm polyp - tubular adenoma. Rpt 7-10 years. Family h/o colorectal cancer: no     03/23/2024    9:20 AM 12/10/2023   10:08 AM 08/20/2023    9:04 AM 07/07/2022   10:19 AM 09/26/2021    4:21 PM  Depression screen PHQ 2/9  Decreased Interest 0 0 0 0 0  Down, Depressed, Hopeless 0 0 0 0 0  PHQ - 2 Score 0 0 0 0 0  Altered sleeping 0 0  0 1  Tired, decreased energy 0 0  0 1  Change in appetite 0 0  0 0  Feeling bad or failure about yourself  0 0  0 0  Trouble concentrating 0 0  0 3  Moving slowly or  fidgety/restless 0 0  0 0  Suicidal thoughts 0 0  0 0  PHQ-9 Score 0 0   0  5   Difficult doing work/chores  Not difficult at all  Not difficult at all Not difficult at all     Data saved with a previous flowsheet row definition        03/23/2024    9:21 AM 12/10/2023   10:09 AM 07/07/2022   10:20 AM  GAD 7 : Generalized Anxiety Score  Nervous, Anxious, on Edge 0 0  0   Control/stop worrying 0 0  0   Worry too much - different things 0 0  0   Trouble relaxing 0 0  0   Restless 0 0  0   Easily annoyed or irritable 0 0  0   Afraid - awful might happen 0 0  0   Total GAD 7 Score 0 0 0  Anxiety Difficulty  Not difficult at all Not difficult at all     Data saved with a previous flowsheet row definition     Review of Systems:   Pertinent items are noted in HPI Denies any headaches, blurred vision, fatigue, shortness of breath, chest pain, abdominal pain, abnormal vaginal discharge/itching/odor/irritation, problems with periods, bowel movements, urination, or intercourse unless otherwise stated above. Pertinent History  Reviewed:  Reviewed past medical,surgical, social and family history.  Reviewed problem list, medications and allergies. Physical Assessment:   Vitals:   03/23/24 0907  BP: 122/66  Pulse: 94  Weight: 140 lb 1.3 oz (63.5 kg)  Height: 5' 2 (1.575 m)  Body mass index is 25.62 kg/m.        Physical Examination:   General appearance - well appearing, and in no distress  Mental status - alert, oriented to person, place, and time  Psych:  She has a normal mood and affect  Skin - warm and dry, normal color, no suspicious lesions noted  Chest - effort normal, all lung fields clear to auscultation bilaterally  Heart - normal rate and regular rhythm  Neck:  midline trachea, no thyromegaly or nodules  Breasts - breasts appear normal, no suspicious masses, no skin or nipple changes or axillary nodes  Abdomen - soft, nontender, nondistended, no masses or  organomegaly  Pelvic - VULVA: normal appearing vulva with no masses, tenderness or lesions  VAGINA: normal appearing vagina with normal color and discharge, no lesions  CERVIX: normal appearing cervix without discharge or lesions, no CMT  Thin prep pap is done with HR HPV cotesting  UTERUS: uterus is felt to be normal size, shape, consistency and nontender   ADNEXA: No adnexal masses or tenderness noted.  Extremities:  No swelling or varicosities noted  Chaperone present for exam  Assessment & Plan:  1. Encounter for well woman exam with routine gynecological exam (Primary) - Cytology - PAP  2. Encounter for breast cancer screening other than mammogram - MM 3D SCREENING MAMMOGRAM BILATERAL BREAST; Future  3. Flu vaccine need - Flu vaccine trivalent PF, 6mos and older(Flulaval,Afluria,Fluarix,Fluzone)  4. Perimenopause Experiencing sleep disturbances, night sweats, and irregular menstrual cycles. Discussed hormone therapy to manage symptoms and prevent complications. Estrogen patch preferred due to lower clot risk. Progesterone  added to prevent endometrial cancer. Minimal breast cancer risk with combined therapy. Hormone therapy may improve hot flashes, night sweats, sleep, and potentially vaginal dryness. - Prescribed estrogen patch twice a week. - Prescribed oral progesterone . - Scheduled follow-up in three months to assess treatment efficacy and side effects.     Orders Placed This Encounter  Procedures   MM 3D SCREENING MAMMOGRAM BILATERAL BREAST   Flu vaccine trivalent PF, 6mos and older(Flulaval,Afluria,Fluarix,Fluzone)    Meds:  Meds ordered this encounter  Medications   estradiol  (VIVELLE -DOT) 0.05 MG/24HR patch    Sig: Place 1 patch (0.05 mg total) onto the skin 2 (two) times a week.    Dispense:  24 patch    Refill:  3   progesterone  (PROMETRIUM ) 200 MG capsule    Sig: Take 1 capsule (200 mg total) by mouth at bedtime.    Dispense:  90 capsule    Refill:  3     Follow-up: Return in about 3 months (around 06/21/2024) for Perimenopause HT.  Demon Volante J Graig Hessling, DO 03/23/2024 9:42 AM  "

## 2024-03-23 NOTE — Progress Notes (Signed)
 Patient presents for Annual.  LMP: No LMP recorded.  Last pap: 02/13/2021 Contraception: None Mammogram: Up to date: 08/05/2023 STD Screening: Declines Flu Vaccine : Accepts  CC:   Fun Fact:

## 2024-03-25 ENCOUNTER — Ambulatory Visit: Payer: Self-pay | Admitting: Family Medicine

## 2024-03-25 LAB — CYTOLOGY - PAP
Comment: NEGATIVE
Diagnosis: NEGATIVE
High risk HPV: NEGATIVE

## 2024-03-30 ENCOUNTER — Other Ambulatory Visit

## 2024-04-11 ENCOUNTER — Ambulatory Visit: Admitting: Family Medicine

## 2024-06-16 ENCOUNTER — Ambulatory Visit: Admitting: Family Medicine
